# Patient Record
Sex: Female | Born: 1935 | Race: White | Hispanic: No | Smoking: Never smoker
Health system: Southern US, Community
[De-identification: ages and names within clinical notes are randomized; demographics above are authoritative.]

## PROBLEM LIST (undated history)

## (undated) DIAGNOSIS — G5600 Carpal tunnel syndrome, unspecified upper limb: Secondary | ICD-10-CM

## (undated) DIAGNOSIS — S04032A Injury of optic tract and pathways, left eye, initial encounter: Secondary | ICD-10-CM

## (undated) DIAGNOSIS — N200 Calculus of kidney: Secondary | ICD-10-CM

## (undated) DIAGNOSIS — M810 Age-related osteoporosis without current pathological fracture: Secondary | ICD-10-CM

## (undated) DIAGNOSIS — I10 Essential (primary) hypertension: Secondary | ICD-10-CM

## (undated) DIAGNOSIS — I2119 ST elevation (STEMI) myocardial infarction involving other coronary artery of inferior wall: Principal | ICD-10-CM

## (undated) DIAGNOSIS — Z905 Acquired absence of kidney: Secondary | ICD-10-CM

## (undated) DIAGNOSIS — H8110 Benign paroxysmal vertigo, unspecified ear: Secondary | ICD-10-CM

## (undated) DIAGNOSIS — I2510 Atherosclerotic heart disease of native coronary artery without angina pectoris: Secondary | ICD-10-CM

## (undated) DIAGNOSIS — E119 Type 2 diabetes mellitus without complications: Secondary | ICD-10-CM

## (undated) DIAGNOSIS — M722 Plantar fascial fibromatosis: Secondary | ICD-10-CM

## (undated) DIAGNOSIS — R269 Unspecified abnormalities of gait and mobility: Secondary | ICD-10-CM

## (undated) DIAGNOSIS — E042 Nontoxic multinodular goiter: Secondary | ICD-10-CM

## (undated) DIAGNOSIS — N393 Stress incontinence (female) (male): Secondary | ICD-10-CM

## (undated) DIAGNOSIS — J309 Allergic rhinitis, unspecified: Secondary | ICD-10-CM

## (undated) DIAGNOSIS — M316 Other giant cell arteritis: Secondary | ICD-10-CM

## (undated) HISTORY — DX: Stress incontinence (female) (male): N39.3

## (undated) HISTORY — DX: Age-related osteoporosis without current pathological fracture: M81.0

## (undated) HISTORY — PX: ABDOMINAL HYSTERECTOMY: SHX81

## (undated) HISTORY — DX: Carpal tunnel syndrome, unspecified upper limb: G56.00

## (undated) HISTORY — DX: Unspecified abnormalities of gait and mobility: R26.9

## (undated) HISTORY — DX: Calculus of kidney: N20.0

## (undated) HISTORY — DX: Atherosclerotic heart disease of native coronary artery without angina pectoris: I25.10

## (undated) HISTORY — DX: Type 2 diabetes mellitus without complications: E11.9

## (undated) HISTORY — DX: Other giant cell arteritis: M31.6

## (undated) HISTORY — PX: CATARACT EXTRACTION, BILATERAL: SHX1313

## (undated) HISTORY — DX: Allergic rhinitis, unspecified: J30.9

## (undated) HISTORY — DX: Injury of optic tract and pathways, left side, initial encounter: S04.032A

## (undated) HISTORY — DX: Benign paroxysmal vertigo, unspecified ear: H81.10

## (undated) HISTORY — DX: Acquired absence of kidney: Z90.5

## (undated) HISTORY — DX: ST elevation (STEMI) myocardial infarction involving other coronary artery of inferior wall: I21.19

## (undated) HISTORY — DX: Essential (primary) hypertension: I10

## (undated) HISTORY — PX: CORONARY ANGIOPLASTY: SHX604

## (undated) HISTORY — DX: Nontoxic multinodular goiter: E04.2

## (undated) HISTORY — DX: Plantar fascial fibromatosis: M72.2

---

## 2000-06-09 ENCOUNTER — Encounter: Payer: Self-pay | Admitting: Family Medicine

## 2000-06-09 ENCOUNTER — Encounter: Admission: RE | Admit: 2000-06-09 | Discharge: 2000-06-09 | Payer: Self-pay | Admitting: Family Medicine

## 2000-06-30 ENCOUNTER — Encounter: Payer: Self-pay | Admitting: Family Medicine

## 2000-06-30 ENCOUNTER — Encounter: Admission: RE | Admit: 2000-06-30 | Discharge: 2000-06-30 | Payer: Self-pay | Admitting: Family Medicine

## 2001-04-09 ENCOUNTER — Encounter: Payer: Self-pay | Admitting: Emergency Medicine

## 2001-04-09 ENCOUNTER — Emergency Department (HOSPITAL_COMMUNITY): Admission: EM | Admit: 2001-04-09 | Discharge: 2001-04-09 | Payer: Self-pay | Admitting: Emergency Medicine

## 2001-05-08 ENCOUNTER — Encounter: Payer: Self-pay | Admitting: Family Medicine

## 2001-05-08 ENCOUNTER — Encounter: Admission: RE | Admit: 2001-05-08 | Discharge: 2001-05-08 | Payer: Self-pay | Admitting: Family Medicine

## 2001-06-11 ENCOUNTER — Encounter: Admission: RE | Admit: 2001-06-11 | Discharge: 2001-06-11 | Payer: Self-pay | Admitting: Family Medicine

## 2001-06-11 ENCOUNTER — Encounter: Payer: Self-pay | Admitting: Family Medicine

## 2002-06-18 ENCOUNTER — Encounter: Payer: Self-pay | Admitting: Family Medicine

## 2002-06-18 ENCOUNTER — Encounter: Admission: RE | Admit: 2002-06-18 | Discharge: 2002-06-18 | Payer: Self-pay | Admitting: Family Medicine

## 2002-10-30 ENCOUNTER — Encounter: Payer: Self-pay | Admitting: Family Medicine

## 2002-10-30 ENCOUNTER — Encounter: Admission: RE | Admit: 2002-10-30 | Discharge: 2002-10-30 | Payer: Self-pay | Admitting: Family Medicine

## 2003-03-17 ENCOUNTER — Ambulatory Visit (HOSPITAL_COMMUNITY): Admission: RE | Admit: 2003-03-17 | Discharge: 2003-03-17 | Payer: Self-pay | Admitting: Gastroenterology

## 2003-04-07 ENCOUNTER — Encounter: Payer: Self-pay | Admitting: Emergency Medicine

## 2003-04-07 ENCOUNTER — Emergency Department (HOSPITAL_COMMUNITY): Admission: EM | Admit: 2003-04-07 | Discharge: 2003-04-07 | Payer: Self-pay | Admitting: Emergency Medicine

## 2003-04-14 ENCOUNTER — Emergency Department (HOSPITAL_COMMUNITY): Admission: EM | Admit: 2003-04-14 | Discharge: 2003-04-14 | Payer: Self-pay | Admitting: Emergency Medicine

## 2003-12-11 ENCOUNTER — Ambulatory Visit (HOSPITAL_COMMUNITY): Admission: RE | Admit: 2003-12-11 | Discharge: 2003-12-11 | Payer: Self-pay | Admitting: Family Medicine

## 2005-10-10 ENCOUNTER — Ambulatory Visit: Payer: Self-pay | Admitting: Pulmonary Disease

## 2005-10-18 ENCOUNTER — Ambulatory Visit: Payer: Self-pay | Admitting: Pulmonary Disease

## 2005-10-20 ENCOUNTER — Ambulatory Visit: Payer: Self-pay | Admitting: Cardiology

## 2005-11-03 ENCOUNTER — Ambulatory Visit: Payer: Self-pay | Admitting: Pulmonary Disease

## 2005-11-04 ENCOUNTER — Ambulatory Visit: Payer: Self-pay | Admitting: Pulmonary Disease

## 2006-04-14 ENCOUNTER — Ambulatory Visit: Payer: Self-pay | Admitting: Emergency Medicine

## 2006-08-19 ENCOUNTER — Inpatient Hospital Stay (HOSPITAL_COMMUNITY): Admission: EM | Admit: 2006-08-19 | Discharge: 2006-08-24 | Payer: Self-pay | Admitting: Emergency Medicine

## 2006-08-21 ENCOUNTER — Ambulatory Visit: Payer: Self-pay | Admitting: Physical Medicine & Rehabilitation

## 2006-08-22 ENCOUNTER — Encounter: Payer: Self-pay | Admitting: Vascular Surgery

## 2006-08-25 ENCOUNTER — Ambulatory Visit: Payer: Self-pay | Admitting: Family Medicine

## 2006-08-30 ENCOUNTER — Ambulatory Visit: Payer: Self-pay | Admitting: Internal Medicine

## 2006-09-04 ENCOUNTER — Ambulatory Visit: Payer: Self-pay | Admitting: Internal Medicine

## 2006-09-06 ENCOUNTER — Ambulatory Visit: Payer: Self-pay | Admitting: Internal Medicine

## 2006-09-08 ENCOUNTER — Ambulatory Visit: Payer: Self-pay | Admitting: Internal Medicine

## 2006-10-12 DIAGNOSIS — N393 Stress incontinence (female) (male): Secondary | ICD-10-CM

## 2006-10-12 DIAGNOSIS — M316 Other giant cell arteritis: Secondary | ICD-10-CM

## 2006-10-12 DIAGNOSIS — J309 Allergic rhinitis, unspecified: Secondary | ICD-10-CM

## 2006-10-12 DIAGNOSIS — M810 Age-related osteoporosis without current pathological fracture: Secondary | ICD-10-CM | POA: Insufficient documentation

## 2006-10-12 HISTORY — DX: Age-related osteoporosis without current pathological fracture: M81.0

## 2006-10-12 HISTORY — DX: Allergic rhinitis, unspecified: J30.9

## 2006-10-12 HISTORY — DX: Other giant cell arteritis: M31.6

## 2006-10-12 HISTORY — DX: Stress incontinence (female) (male): N39.3

## 2007-01-22 ENCOUNTER — Encounter: Payer: Self-pay | Admitting: Family Medicine

## 2007-01-22 DIAGNOSIS — R269 Unspecified abnormalities of gait and mobility: Secondary | ICD-10-CM

## 2007-01-22 DIAGNOSIS — S329XXA Fracture of unspecified parts of lumbosacral spine and pelvis, initial encounter for closed fracture: Secondary | ICD-10-CM | POA: Insufficient documentation

## 2007-01-22 HISTORY — DX: Unspecified abnormalities of gait and mobility: R26.9

## 2008-09-10 ENCOUNTER — Encounter: Payer: Self-pay | Admitting: Family Medicine

## 2008-11-18 ENCOUNTER — Ambulatory Visit: Payer: Self-pay | Admitting: Family Medicine

## 2008-11-18 LAB — CONVERTED CEMR LAB: Sed Rate: 27 mm/hr — ABNORMAL HIGH (ref 0–22)

## 2008-12-18 ENCOUNTER — Ambulatory Visit: Payer: Self-pay | Admitting: Family Medicine

## 2009-01-27 ENCOUNTER — Encounter: Payer: Self-pay | Admitting: Family Medicine

## 2009-03-16 ENCOUNTER — Ambulatory Visit: Payer: Self-pay | Admitting: Family Medicine

## 2009-03-16 DIAGNOSIS — R5381 Other malaise: Secondary | ICD-10-CM

## 2009-03-16 DIAGNOSIS — R5383 Other fatigue: Secondary | ICD-10-CM

## 2009-03-17 LAB — CONVERTED CEMR LAB
Hgb A1c MFr Bld: 6.5 % (ref 4.6–6.5)
Sed Rate: 13 mm/hr (ref 0–22)

## 2009-08-04 ENCOUNTER — Ambulatory Visit: Payer: Self-pay | Admitting: Family Medicine

## 2009-08-04 DIAGNOSIS — G5600 Carpal tunnel syndrome, unspecified upper limb: Secondary | ICD-10-CM | POA: Insufficient documentation

## 2009-08-04 HISTORY — DX: Carpal tunnel syndrome, unspecified upper limb: G56.00

## 2009-08-26 ENCOUNTER — Telehealth: Payer: Self-pay | Admitting: Family Medicine

## 2009-09-14 ENCOUNTER — Ambulatory Visit: Payer: Self-pay | Admitting: Family Medicine

## 2009-09-16 LAB — CONVERTED CEMR LAB: Microalb, Ur: 2.2 mg/dL — ABNORMAL HIGH (ref 0.0–1.9)

## 2009-12-08 ENCOUNTER — Ambulatory Visit: Payer: Self-pay | Admitting: Family Medicine

## 2009-12-08 LAB — CONVERTED CEMR LAB
Hgb A1c MFr Bld: 6.5 % (ref 4.6–6.5)
Sed Rate: 10 mm/hr (ref 0–22)

## 2010-02-09 ENCOUNTER — Ambulatory Visit: Payer: Self-pay | Admitting: Family Medicine

## 2010-03-12 ENCOUNTER — Ambulatory Visit: Payer: Self-pay | Admitting: Family Medicine

## 2010-03-12 DIAGNOSIS — R030 Elevated blood-pressure reading, without diagnosis of hypertension: Secondary | ICD-10-CM

## 2010-03-19 ENCOUNTER — Telehealth: Payer: Self-pay | Admitting: Family Medicine

## 2010-03-26 ENCOUNTER — Ambulatory Visit: Payer: Self-pay | Admitting: Family Medicine

## 2010-03-26 DIAGNOSIS — M722 Plantar fascial fibromatosis: Secondary | ICD-10-CM

## 2010-03-26 HISTORY — DX: Plantar fascial fibromatosis: M72.2

## 2010-03-30 LAB — CONVERTED CEMR LAB: Sed Rate: 66 mm/hr — ABNORMAL HIGH (ref 0–22)

## 2010-04-02 ENCOUNTER — Ambulatory Visit: Payer: Self-pay | Admitting: Family Medicine

## 2010-05-04 ENCOUNTER — Ambulatory Visit: Payer: Self-pay | Admitting: Family Medicine

## 2010-05-06 ENCOUNTER — Telehealth: Payer: Self-pay | Admitting: Family Medicine

## 2010-06-11 ENCOUNTER — Ambulatory Visit: Payer: Self-pay | Admitting: Family Medicine

## 2010-06-14 LAB — CONVERTED CEMR LAB
Hgb A1c MFr Bld: 7.3 % — ABNORMAL HIGH (ref 4.6–6.5)
Sed Rate: 4 mm/hr (ref 0–22)

## 2010-06-15 ENCOUNTER — Telehealth: Payer: Self-pay | Admitting: Family Medicine

## 2010-06-21 ENCOUNTER — Telehealth: Payer: Self-pay | Admitting: Family Medicine

## 2010-06-24 ENCOUNTER — Ambulatory Visit: Payer: Self-pay | Admitting: Family Medicine

## 2010-06-28 ENCOUNTER — Telehealth: Payer: Self-pay | Admitting: Family Medicine

## 2010-08-12 ENCOUNTER — Telehealth: Payer: Self-pay | Admitting: Family Medicine

## 2010-08-19 ENCOUNTER — Other Ambulatory Visit: Payer: Self-pay | Admitting: Family Medicine

## 2010-08-19 ENCOUNTER — Ambulatory Visit
Admission: RE | Admit: 2010-08-19 | Discharge: 2010-08-19 | Payer: Self-pay | Source: Home / Self Care | Attending: Family Medicine | Admitting: Family Medicine

## 2010-08-19 LAB — HEMOGLOBIN A1C: Hgb A1c MFr Bld: 7.5 % — ABNORMAL HIGH (ref 4.6–6.5)

## 2010-08-19 LAB — SEDIMENTATION RATE: Sed Rate: 15 mm/hr (ref 0–22)

## 2010-09-05 ENCOUNTER — Encounter: Payer: Self-pay | Admitting: Family Medicine

## 2010-09-14 ENCOUNTER — Telehealth: Payer: Self-pay | Admitting: Family Medicine

## 2010-09-14 NOTE — Assessment & Plan Note (Signed)
Summary: 3 month rov/njr   Vital Signs:  Patient profile:   77 year old female Menstrual status:  postmenopausal Height:      63.5 inches Weight:      142 pounds Temp:     98.2 degrees F oral Pulse rate:   72 / minute Resp:     14 per minute BP sitting:   120 / 70  (left arm)  Vitals Entered By: Willy Eddy, LPN (December 08, 2009 9:57 AM) CC: roa-has been out of fosamax for 2 weeks-c/o intermittent diarrhea for about 2 months   History of Present Illness: Patient seen for follow up items below.  History temporal arteritis. Has been on low-dose prednisone and methotrexate but recent sedimentation rates normal and pt clinically stable for several months now. No recent headaches or body aches. She has some generalized arthritis pains. No longer seeing rheumatologist. She sees ophthalmologist once per year. No recent change of vision.  History of osteoporosis. Currently taking calcium and vitamin D. No recent fractures. Needs refills Fosamax.  Type 2 diabetes with onset of prednisone therapy. Currently very low dose glimepiride 1 mg daily. Blood sugar stable.  Diabetes Management History:      She has not been enrolled in the "Diabetic Education Program".  She states understanding of dietary principles and is following her diet appropriately.  No sensory loss is reported.  Self foot exams are being performed.  She is checking home blood sugars.  She says that she is not exercising regularly.        Hypoglycemic symptoms are not occurring.  No hyperglycemic symptoms are reported.        There are no symptoms to suggest diabetic complications.     Preventive Screening-Counseling & Management  Alcohol-Tobacco     Smoking Status: never  Current Problems (verified): 1)  Carpal Tunnel Syndrome  (ICD-354.0) 2)  Fatigue  (ICD-780.79) 3)  Aodm  (ICD-250.00) 4)  Unsteady Gait  (ICD-781.2) 5)  Pelvic Fracture  (ICD-808.8) 6)  Temporal Arteritis  (ICD-446.5) 7)  Rhinitis, Allergic   (ICD-477.9) 8)  Osteoporosis, Unspecified  (ICD-733.00) 9)  Incontinence, Stress, Female  (ICD-625.6) 10)  Hypertension, Benign Systemic  (ICD-401.1)  Current Medications (verified): 1)  Prednisone 5 Mg Tabs (Prednisone) .... Once Daily 2)  Glimepiride 1 Mg Tabs (Glimepiride) .... Once Daily 3)  Methotrexate 2.5 Mg Tabs (Methotrexate Sodium) .... 2 Tabs By Mouth Once Weekly 4)  Fosamax 70 Mg Tabs (Alendronate Sodium) .... One Tab By Mouth Weekly 5)  Calcium + D 250-125 Mg-Unit Tabs (Calcium-Vitamin D) .... Once Daily  Allergies (verified): 1)  ! Penicillin V Potassium (Penicillin V Potassium) 2)  ! Sulfadiazine (Sulfadiazine)  Past History:  Past Medical History: Last updated: 11/18/2008 decreased hearing, hyperglycemia (2nd to chronic steroids), immunocomprimised ( 2nd to methotrexate_, intrathoracic LAD, multinodular thyroid, solitary L kidney (atrophic vs congenital), visual loss (2 to temporal arteritis) Kidney stones Temporal arteritis  Social History: Last updated: 11/18/2008 Retired Widow/Widower Never Smoked Alcohol use-no  Review of Systems  The patient denies anorexia, fever, weight loss, chest pain, syncope, dyspnea on exertion, peripheral edema, prolonged cough, headaches, hemoptysis, abdominal pain, melena, hematochezia, severe indigestion/heartburn, hematuria, and muscle weakness.    Physical Exam  General:  Well-developed,well-nourished,in no acute distress; alert,appropriate and cooperative throughout examination Head:  no temporal artery tenderness Lungs:  Normal respiratory effort, chest expands symmetrically. Lungs are clear to auscultation, no crackles or wheezes. Heart:  Normal rate and regular rhythm. S1 and S2 normal without gallop,  murmur, click, rub or other extra sounds. Neurologic:  alert & oriented X3, cranial nerves II-XII intact, and strength normal in all extremities.   Cervical Nodes:  No lymphadenopathy noted Psych:  normally interactive,  good eye contact, not anxious appearing, and not depressed appearing.    Diabetes Management Exam:    Foot Exam (with socks and/or shoes not present):       Sensory-Pinprick/Light touch:          Left medial foot (L-4): normal          Left dorsal foot (L-5): normal          Left lateral foot (S-1): normal          Right medial foot (L-4): normal          Right dorsal foot (L-5): normal          Right lateral foot (S-1): normal       Sensory-Monofilament:          Left foot: normal          Right foot: normal       Inspection:          Left foot: normal          Right foot: normal       Nails:          Left foot: normal          Right foot: normal    Eye Exam:       Eye Exam done elsewhere          Date: 02/12/2009          Results: normal          Done by: Dr Elmer Picker   Impression & Recommendations:  Problem # 1:  AODM (ICD-250.00) consider discontinue and follow after off prednisone. Her updated medication list for this problem includes:    Glimepiride 1 Mg Tabs (Glimepiride) ..... Once daily  Orders: Venipuncture (81191) TLB-A1C / Hgb A1C (Glycohemoglobin) (83036-A1C)  Problem # 2:  TEMPORAL ARTERITIS (ICD-446.5) Clinically stable.  Repeat sed rate and if stable will taper off prednisone and methotrexate. Orders: Venipuncture (47829) TLB-Sedimentation Rate (ESR) (85652-ESR)  Problem # 3:  OSTEOPOROSIS, UNSPECIFIED (ICD-733.00) refilled Fosamax. Her updated medication list for this problem includes:    Fosamax 70 Mg Tabs (Alendronate sodium) ..... One tab by mouth weekly    Calcium + D 250-125 Mg-unit Tabs (Calcium-vitamin d) ..... Once daily  Complete Medication List: 1)  Prednisone 5 Mg Tabs (Prednisone) .... Alternate 5mg  and 2.5mg  every other day for 3 weeks and then 2.5 mg for 3 weeks and then discontinue 2)  Glimepiride 1 Mg Tabs (Glimepiride) .... Once daily 3)  Methotrexate 2.5 Mg Tabs (Methotrexate sodium) .... 2 tabs by mouth once weekly 4)  Fosamax 70  Mg Tabs (Alendronate sodium) .... One tab by mouth weekly 5)  Calcium + D 250-125 Mg-unit Tabs (Calcium-vitamin d) .... Once daily  Diabetes Management Assessment/Plan:      Her blood pressure goal is < 130/80.    Patient Instructions: 1)  We will plan to taper back your medications including methotrexate and prednisone if the labs are favorable 2)  Please schedule a follow-up appointment in 3 months .  Prescriptions: FOSAMAX 70 MG TABS (ALENDRONATE SODIUM) one tab by mouth weekly  #12 x 3   Entered and Authorized by:   Evelena Peat MD   Signed by:   Evelena Peat MD on 12/08/2009  Method used:   Electronically to        [facility]* (mail-order)             ,          Ph: [phone]       Fax: [phone]   RxID:   [id number] METHOTREXATE 2.5 MG TABS (METHOTREXATE SODIUM) 2 tabs by mouth once weekly  #36 x 3   Entered and Authorized by:   Evelena Peat MD   Signed by:   Evelena Peat MD on 12/08/2009   Method used:   Electronically to        [facility]* (mail-order)             ,          Ph: [phone]       Fax: [phone]   RxID:   [id number] GLIMEPIRIDE 1 MG TABS (GLIMEPIRIDE) once daily  #90 x 3   Entered and Authorized by:   Evelena Peat MD   Signed by:   Evelena Peat MD on 12/08/2009   Method used:   Electronically to        [facility]* (mail-order)             ,          Ph: [phone]       Fax: [phone]   RxID:   [id number]

## 2010-09-14 NOTE — Assessment & Plan Note (Signed)
Summary: 1 moonth f/u//alp   Vital Signs:  Patient profile:   77 year old female Menstrual status:  postmenopausal Weight:      147 pounds Temp:     98.0 degrees F oral BP sitting:   142 / 70  (left arm) Cuff size:   regular  Vitals Entered By: Sid Falcon LPN (March 12, 2010 10:49 AM)  Serial Vital Signs/Assessments:  Time      Position  BP       Pulse  Resp  Temp     By                     140/64                         Evelena Peat MD   History of Present Illness: patient for followup elevated blood pressure. She's not any headaches or dizziness. Never treated for hypertension.  She has type 2 diabetes probably induced by prednisone which she was on for temporal arteritis. She is back to very low-dose prednisone at this time. Recent fasting blood sugar 136. No symptoms of hyperglycemia.  Allergies: 1)  ! Penicillin V Potassium (Penicillin V Potassium) 2)  ! Sulfadiazine (Sulfadiazine)  Past History:  Past Medical History: Last updated: 11/18/2008 decreased hearing, hyperglycemia (2nd to chronic steroids), immunocomprimised ( 2nd to methotrexate_, intrathoracic LAD, multinodular thyroid, solitary L kidney (atrophic vs congenital), visual loss (2 to temporal arteritis) Kidney stones Temporal arteritis  Review of Systems  The patient denies anorexia, fever, weight loss, chest pain, dyspnea on exertion, and peripheral edema.    Physical Exam  General:  Well-developed,well-nourished,in no acute distress; alert,appropriate and cooperative throughout examination Neck:  No deformities, masses, or tenderness noted. Lungs:  Normal respiratory effort, chest expands symmetrically. Lungs are clear to auscultation, no crackles or wheezes. Heart:  normal rate and regular rhythm.   Extremities:  no edema.   Impression & Recommendations:  Problem # 1:  ELEVATED BLOOD PRESSURE (ICD-796.2) Assessment Improved blood pressure has improved today. Continue to monitor this  time.  Problem # 2:  AODM (ICD-250.00) cont current dose of med.  Repeat A1C and sed rate in 3 months and if stable at that time plan to d/c prednisone and diabetic med then. Her updated medication list for this problem includes:    Glimepiride 1 Mg Tabs (Glimepiride) ..... Once daily  Complete Medication List: 1)  Prednisone 5 Mg Tabs (Prednisone) .... Alternate 5mg  and 2.5mg  every other day for 3 weeks and then 2.5 mg for 3 weeks and then discontinue 2)  Glimepiride 1 Mg Tabs (Glimepiride) .... Once daily 3)  Methotrexate 2.5 Mg Tabs (Methotrexate sodium) .... 2 tabs by mouth once weekly 4)  Fosamax 70 Mg Tabs (Alendronate sodium) .... One tab by mouth weekly 5)  Calcium + D 250-125 Mg-unit Tabs (Calcium-vitamin d) .... Once daily 6)  Vitamin E 400 Unit Caps (Vitamin e) .... Once daily  Other Orders: Prescription Created Electronically [phone])  Patient Instructions: 1)  Please schedule a follow-up appointment in 3 months .  2)  Limit your Sodium(salt) .  3)  Check your  Blood Pressure regularly . If it is above:140/90   you should make an appointment. Prescriptions: FOSAMAX 70 MG TABS (ALENDRONATE SODIUM) one tab by mouth weekly  #12 x 3   Entered and Authorized by:   Evelena Peat MD   Signed by:   Evelena Peat MD on 03/12/2010  Method used:   Electronically to        [facility]  [place] [address] (retail)       [address]       [place], [place]  [address]       Ph: [phone] or [phone]       Fax: [phone]   RxID:   [id number] GLIMEPIRIDE 1 MG TABS (GLIMEPIRIDE) once daily  #90 x 3   Entered and Authorized by:   Evelena Peat MD   Signed by:   Evelena Peat MD on 03/12/2010   Method used:   Electronically to        [facility]  [place] [address] (retail)       [address]       [place], [place]  [address]       Ph: [phone] or [phone]       Fax: [phone]   RxID:   [id number]

## 2010-09-14 NOTE — Assessment & Plan Note (Signed)
Summary: recheck/sedrate/dm   Vital Signs:  Patient profile:   77 year old female Menstrual status:  postmenopausal Weight:      145 pounds Temp:     97.6 degrees F oral BP sitting:   130 / 62  (left arm) Cuff size:   regular  Vitals Entered By: Sid Falcon LPN (April 02, 2010 10:05 AM) CC: 1 month follow-up   History of Present Illness: Recent fatigue and some off and on headaches. Diagnosed with PMR/temporal arteritis several years ago.  We had been tapering back her prednisone.  She remains on methotrexate. Recent Sed rate back up to 66.   No change in vision.    Headaches are dull and poorly localized.  Allergies: 1)  ! Penicillin V Potassium (Penicillin V Potassium) 2)  ! Sulfadiazine (Sulfadiazine)  Past History:  Past Medical History: Last updated: 11/18/2008 decreased hearing, hyperglycemia (2nd to chronic steroids), immunocomprimised ( 2nd to methotrexate_, intrathoracic LAD, multinodular thyroid, solitary L kidney (atrophic vs congenital), visual loss (2 to temporal arteritis) Kidney stones Temporal arteritis  Social History: Last updated: 11/18/2008 Retired Widow/Widower Never Smoked Alcohol use-no PMH reviewed for relevance  Review of Systems  The patient denies anorexia, fever, weight loss, chest pain, syncope, dyspnea on exertion, peripheral edema, and prolonged cough.    Physical Exam  General:  Well-developed,well-nourished,in no acute distress; alert,appropriate and cooperative throughout examination Head:  No temporal tenderness. Ears:  External ear exam shows no significant lesions or deformities.  Otoscopic examination reveals clear canals, tympanic membranes are intact bilaterally without bulging, retraction, inflammation or discharge. Hearing is grossly normal bilaterally. Mouth:  Oral mucosa and oropharynx without lesions or exudates.  Teeth in good repair. Lungs:  Normal respiratory effort, chest expands symmetrically. Lungs are clear  to auscultation, no crackles or wheezes. Heart:  normal rate and regular rhythm.   Extremities:  No clubbing, cyanosis, edema, or deformity noted with normal full range of motion of all joints.   Neurologic:  alert & oriented X3, cranial nerves II-XII intact, strength normal in all extremities, and gait normal.   Skin:  no rashes.     Impression & Recommendations:  Problem # 1:  FATIGUE (ICD-780.79) ?multifactorial.  Problem # 2:  TEMPORAL ARTERITIS (ICD-446.5) ESR repeat.  Pred has been increased back to 10 mg daily for the time being. Orders: Sedimentation Rate, non-automated (30865)  Complete Medication List: 1)  Prednisone 5 Mg Tabs (Prednisone) .... Alternate 5mg  and 2.5mg  every other day for 3 weeks and then 2.5 mg for 3 weeks and then discontinue 2)  Glimepiride 1 Mg Tabs (Glimepiride) .... Once daily 3)  Methotrexate 2.5 Mg Tabs (Methotrexate sodium) .... 2 tabs by mouth once weekly 4)  Fosamax 70 Mg Tabs (Alendronate sodium) .... One tab by mouth weekly 5)  Calcium + D 250-125 Mg-unit Tabs (Calcium-vitamin d) .... Once daily 6)  Vitamin E 400 Unit Caps (Vitamin e) .... Once daily 7)  Fluticasone Propionate 50 Mcg/act Susp (Fluticasone propionate) .... 2 sprays per nostril once daily as needed.  Other Orders: Venipuncture (78469)  Patient Instructions: 1)  Please schedule a follow-up appointment in 1 month.   Laboratory Results   Blood Tests   Date/Time Recieved: April 02, 2010 11:23 AM  Date/Time Reported: April 02, 2010 11:23 AM   SED rate: 20  Comments: Wynona Canes, CMA  April 02, 2010 11:23 AM      Appended Document: recheck/sedrate/dm sed rate back down to 20.  cont prednisone 10 mg daily and  office follow up one month.  Appended Document: recheck/sedrate/dm Pt is calling for lab results. [phone]  Appended Document: recheck/sedrate/dm Pt informed.  She will cont on Prednisone and follow-up in one month

## 2010-09-14 NOTE — Progress Notes (Signed)
Summary: Pt req generic fosomax to [facility] mail order rx  Phone Note Call from Patient Call back at Home Phone [phone]   Caller: Patient Summary of Call: Pt req refill of generic Fosamax to [facility] mail order pharmacy.  Initial call taken by: Lucy Antigua,  August 26, 2009 11:23 AM  Follow-up for Phone Call        Pt informed Rx sent to [facility] Follow-up by: Sid Falcon LPN,  August 26, 2009 1:15 PM    Prescriptions: FOSAMAX 70 MG TABS (ALENDRONATE SODIUM) one tab by mouth weekly  #12 x 3   Entered by:   Sid Falcon LPN   Authorized by:   Evelena Peat MD   Signed by:   Sid Falcon LPN on 09/81/1914   Method used:   Electronically to        [facility]* (mail-order)             ,          Ph: [phone]       Fax: [phone]   RxID:   [id number]

## 2010-09-14 NOTE — Assessment & Plan Note (Signed)
Summary: headache/dm   Vital Signs:  Patient profile:   77 year old female Menstrual status:  postmenopausal Weight:      142 pounds Temp:     98.1 degrees F oral BP sitting:   110 / 70  (left arm) Cuff size:   regular  Vitals Entered By: Duard Brady LPN (March 26, 2010 2:38 PM) CC: c/o headache - 'all over pain' Is Patient Diabetic? No   History of Present Illness: Progressive fatigue, headaches, and achiness. Symptoms for weeks.  Less active.  Hx of PMR and tapering prednisone at 2.5 mg daily. CBGs stable.  No blurred vision.  L heel pain worse first in AM and 2-3 weeks duration.  No injury and no change of shoe wear. No alleviating factors.  allergy symptoms recently.  Responded to Nasonex in past.  Would like to consider nasal steroid. No purulent secretions.  No fever or chills.  Allergies: 1)  ! Penicillin V Potassium (Penicillin V Potassium) 2)  ! Sulfadiazine (Sulfadiazine)  Past History:  Past Medical History: Last updated: 11/18/2008 decreased hearing, hyperglycemia (2nd to chronic steroids), immunocomprimised ( 2nd to methotrexate_, intrathoracic LAD, multinodular thyroid, solitary L kidney (atrophic vs congenital), visual loss (2 to temporal arteritis) Kidney stones Temporal arteritis PMH reviewed for relevance  Review of Systems  The patient denies anorexia, fever, weight loss, chest pain, syncope, dyspnea on exertion, peripheral edema, prolonged cough, hemoptysis, abdominal pain, melena, hematochezia, difficulty walking, and depression.    Physical Exam  General:  Well-developed,well-nourished,in no acute distress; alert,appropriate and cooperative throughout examination Head:  Normocephalic and atraumatic without obvious abnormalities. No apparent alopecia or balding. Eyes:  pupils equal, pupils round, and pupils reactive to light.   Ears:  External ear exam shows no significant lesions or deformities.  Otoscopic examination reveals clear  canals, tympanic membranes are intact bilaterally without bulging, retraction, inflammation or discharge. Hearing is grossly normal bilaterally. Mouth:  Oral mucosa and oropharynx without lesions or exudates.  Teeth in good repair. Neck:  No deformities, masses, or tenderness noted. Lungs:  Normal respiratory effort, chest expands symmetrically. Lungs are clear to auscultation, no crackles or wheezes. Heart:  normal rate and regular rhythm.   Msk:  No deformity or scoliosis noted of thoracic or lumbar spine.   Extremities:  No clubbing, cyanosis, edema, or deformity noted with normal full range of motion of all joints.   Neurologic:  alert & oriented X3 and cranial nerves II-XII intact.     Impression & Recommendations:  Problem # 1:  TEMPORAL ARTERITIS (ICD-446.5) ?flare as steroids tapered, though surprising since she has had dx now for several years.  If sed rate increasing, consider bump in steroids. Orders: Specimen Handling (09811) Venipuncture (91478) TLB-Sedimentation Rate (ESR) (85652-ESR)  Problem # 2:  FATIGUE (ICD-780.79) ?sec to #1.  Problem # 3:  FASCIITIS, PLANTAR (ICD-728.71) Assessment: New discussed rx options.  pt not interested in steroid injection at this time.  Problem # 4:  RHINITIS, ALLERGIC (ICD-477.9)  Flonase steroid. Her updated medication list for this problem includes:    Fluticasone Propionate 50 Mcg/act Susp (Fluticasone propionate) .Marland Kitchen... 2 sprays per nostril once daily as needed.  Orders: Prescription Created Electronically [phone])  Complete Medication List: 1)  Prednisone 5 Mg Tabs (Prednisone) .... Alternate 5mg  and 2.5mg  every other day for 3 weeks and then 2.5 mg for 3 weeks and then discontinue 2)  Glimepiride 1 Mg Tabs (Glimepiride) .... Once daily 3)  Methotrexate 2.5 Mg Tabs (Methotrexate sodium) .... 2 tabs  by mouth once weekly 4)  Fosamax 70 Mg Tabs (Alendronate sodium) .... One tab by mouth weekly 5)  Calcium + D 250-125 Mg-unit Tabs  (Calcium-vitamin d) .... Once daily 6)  Vitamin E 400 Unit Caps (Vitamin e) .... Once daily 7)  Fluticasone Propionate 50 Mcg/act Susp (Fluticasone propionate) .... 2 sprays per nostril once daily as needed. Prescriptions: FLUTICASONE PROPIONATE 50 MCG/ACT SUSP (FLUTICASONE PROPIONATE) 2 sprays per nostril once daily as needed.  #1 x 5   Entered and Authorized by:   Evelena Peat MD   Signed by:   Evelena Peat MD on 03/26/2010   Method used:   [facility] to        [facility]  [place] [address] (retail)       [address]       [place], [place]  [address]       Ph: [phone] or [phone]       Fax: [phone]   RxID:   [id number]

## 2010-09-14 NOTE — Assessment & Plan Note (Signed)
Summary: CPX (PT WILL COME IN FASTING) // RS   Vital Signs:  Patient profile:   77 year old female Menstrual status:  postmenopausal Height:      63.5 inches Weight:      146 pounds Temp:     97.5 degrees F oral Pulse rate:   70 / minute Pulse rhythm:   regular Resp:     12 per minute BP sitting:   148 / 80  (left arm) Cuff size:   regular  Vitals Entered By: Sid Falcon LPN (September 14, 2009 10:40 AM) CC: CPX, pt fasting, med refills   History of Present Illness: Patient seen for multiple problems followup.  History polymyalgia rheumatica and temporal arteritis. Close follow up with ophthalmologist. been on prednisone and methotrexate for several years.  sedimentation rate been stable. Currently prednisone 5 mg daily and methotrexate 2.5 mg 3 tablets once weekly. No recent muscle aches and no visual disturbance.  Prednisone-induced diabetes. Low-dose glimepiride 1 mg daily. No hypoglycemia. No symptoms of hyperglycemia. A1cs have been stable.    Osteoporosis treated with Fosamax. Takes vitamin D and calcium supplement.  Diabetes Management History:      She has not been enrolled in the "Diabetic Education Program".  She states understanding of dietary principles and is following her diet appropriately.  No sensory loss is reported.  Self foot exams are being performed.  She is checking home blood sugars.  She says that she is not exercising regularly.        Hypoglycemic symptoms are not occurring.  No hyperglycemic symptoms are reported.        There are no symptoms to suggest diabetic complications.  No changes have been made to her treatment plan since last visit.    Preventive Screening-Counseling & Management  Alcohol-Tobacco     Smoking Status: never  Caffeine-Diet-Exercise     Does Patient Exercise: no  Allergies: 1)  ! Penicillin V Potassium (Penicillin V Potassium) 2)  ! Sulfadiazine (Sulfadiazine)  Past History:  Past Medical History: Last updated:  11/18/2008 decreased hearing, hyperglycemia (2nd to chronic steroids), immunocomprimised ( 2nd to methotrexate_, intrathoracic LAD, multinodular thyroid, solitary L kidney (atrophic vs congenital), visual loss (2 to temporal arteritis) Kidney stones Temporal arteritis  Family History: Last updated: 11/18/2008 Family History of Arthritis Family History of Cardiovascular disorder Stroke  Social History: Last updated: 11/18/2008 Retired Widow/Widower Never Smoked Alcohol use-no  Social History: Does Patient Exercise:  no  Review of Systems  The patient denies anorexia, fever, weight loss, weight gain, vision loss, hoarseness, chest pain, syncope, dyspnea on exertion, prolonged cough, headaches, hemoptysis, abdominal pain, melena, hematochezia, severe indigestion/heartburn, hematuria, incontinence, muscle weakness, transient blindness, and depression.         chronic decreased hearing unchanged. Has bilateral hearing aids.mild peripheral edema unchanged  Physical Exam  General:  Well-developed,well-nourished,in no acute distress; alert,appropriate and cooperative throughout examination Head:  Normocephalic and atraumatic without obvious abnormalities. No apparent alopecia or balding. Eyes:  pupils equal, pupils round, and pupils reactive to light.   Ears:  minimal cerumen left canal right clear Nose:  External nasal examination shows no deformity or inflammation. Nasal mucosa are pink and moist without lesions or exudates. Mouth:  Oral mucosa and oropharynx without lesions or exudates.  Teeth in good repair. Neck:  No deformities, masses, or tenderness noted. Lungs:  Normal respiratory effort, chest expands symmetrically. Lungs are clear to auscultation, no crackles or wheezes. Heart:  Normal rate and regular rhythm. S1 and S2  normal without gallop, murmur, click, rub or other extra sounds. Abdomen:  Bowel sounds positive,abdomen soft and non-tender without masses, organomegaly or  hernias noted. Extremities:  trace edema ankles bilaterally. Good distal pulses throughout Neurologic:  alert & oriented X3, cranial nerves II-XII intact, and strength normal in all extremities.    Diabetes Management Exam:    Foot Exam (with socks and/or shoes not present):       Sensory-Pinprick/Light touch:          Left medial foot (L-4): normal          Left dorsal foot (L-5): normal          Left lateral foot (S-1): normal          Right medial foot (L-4): normal          Right dorsal foot (L-5): normal          Right lateral foot (S-1): normal       Sensory-Monofilament:          Left foot: normal          Right foot: normal       Inspection:          Left foot: normal          Right foot: normal       Nails:          Left foot: normal          Right foot: normal    Eye Exam:       Eye Exam done elsewhere          Date: 10/13/2008          Results: normal          Done by: Dr Elmer Picker   Impression & Recommendations:  Problem # 1:  AODM (ICD-250.00)  Her updated medication list for this problem includes:    Glimepiride 1 Mg Tabs (Glimepiride) ..... Once daily  Orders: TLB-A1C / Hgb A1C (Glycohemoglobin) (83036-A1C) TLB-Microalbumin/Creat Ratio, Urine (82043-MALB)  Problem # 2:  TEMPORAL ARTERITIS (ICD-446.5) recheck ESR.  Suspect we will be able to taper methotrexate and prednisone further this year. Orders: TLB-Sedimentation Rate (ESR) (85652-ESR)  Problem # 3:  OSTEOPOROSIS, UNSPECIFIED (ICD-733.00)  Her updated medication list for this problem includes:    Fosamax 70 Mg Tabs (Alendronate sodium) ..... One tab by mouth weekly    Calcium + D 250-125 Mg-unit Tabs (Calcium-vitamin d) ..... Once daily  Orders: T-Vitamin D (25-Hydroxy) [phone])  Complete Medication List: 1)  Prednisone 5 Mg Tabs (Prednisone) .... Once daily 2)  Glimepiride 1 Mg Tabs (Glimepiride) .... Once daily 3)  Methotrexate 2.5 Mg Tabs (Methotrexate sodium) .... 3 tabs by mouth once  weekly 4)  Fosamax 70 Mg Tabs (Alendronate sodium) .... One tab by mouth weekly 5)  Calcium + D 250-125 Mg-unit Tabs (Calcium-vitamin d) .... Once daily  Other Orders: Flu Vaccine 107yrs + (09811) Administration Flu vaccine - MCR (B1478)  Patient Instructions: 1)  Contact Dr Kenna Gilbert office regarding repeat colonoscopy. 2)  It is important that your diabetic A1c level is checked every 3 months.  3)  See your eye doctor yearly to check for diabetic eye damage. 4)  Check your feet each night  for sore areas, calluses or signs of infection.  5)  Please schedule a follow-up appointment in 3 months .  Prescriptions: METHOTREXATE 2.5 MG TABS (METHOTREXATE SODIUM) 3 tabs by mouth once weekly  #36 x 3   Entered and  Authorized by:   Evelena Peat MD   Signed by:   Evelena Peat MD on 09/14/2009   Method used:   Electronically to        [facility]* (mail-order)             ,          Ph: [phone]       Fax: [phone]   RxID:   [id number]    Flu Vaccine Consent Questions     Do you have a history of severe allergic reactions to this vaccine? no    Any prior history of allergic reactions to egg and/or gelatin? no    Do you have a sensitivity to the preservative Thimersol? no    Do you have a past history of Guillan-Barre Syndrome? no    Do you currently have an acute febrile illness? no    Have you ever had a severe reaction to latex? no    Vaccine information given and explained to patient? yes    Are you currently pregnant? no    Lot Number:[id number]   Exp Date:02/11/2010   Site Given  Left Deltoid IMdflu

## 2010-09-14 NOTE — Progress Notes (Signed)
Summary: Pt says scripts should have been sent to [facility] not [facility]  Phone Note Call from Patient Call back at Home Phone [phone]   Caller: Patient Summary of Call: Pt called and said that all her scripts were suppose to be sent to [facility]. Pls have these script transferred to [facility]. [facility].  Pt has been out of medicine for week.  Initial call taken by: Lucy Antigua,  March 19, 2010 11:23 AM  Follow-up for Phone Call        Rx sent, pt informed Follow-up by: Sid Falcon LPN,  March 19, 2010 11:44 AM    Prescriptions: FOSAMAX 70 MG TABS (ALENDRONATE SODIUM) one tab by mouth weekly  #12 x 3   Entered by:   Sid Falcon LPN   Authorized by:   Evelena Peat MD   Signed by:   Sid Falcon LPN on 30/86/5784   Method used:   Electronically to        [facility]* (retail)             ,          Ph: [phone]       Fax: [phone]   RxID:   [id number] GLIMEPIRIDE 1 MG TABS (GLIMEPIRIDE) once daily  #90 x 3   Entered by:   Sid Falcon LPN   Authorized by:   Evelena Peat MD   Signed by:   Sid Falcon LPN on 38/75/6433   Method used:   Electronically to        [facility]* (retail)             ,          Ph: [phone]       Fax: [phone]   RxID:   [id number]

## 2010-09-14 NOTE — Progress Notes (Signed)
Summary: REFILL REQUEST  Phone Note Refill Request Message from:  Patient  [phone] on June 21, 2010 4:50 PM  Refills Requested: Medication #1:  GLIMEPIRIDE 1 MG TABS once daily   Notes: [facility].    Initial call taken by: Debbra Riding,  June 21, 2010 4:51 PM    Prescriptions: GLIMEPIRIDE 1 MG TABS (GLIMEPIRIDE) once daily  #90 x 3   Entered by:   Sid Falcon LPN   Authorized by:   Evelena Peat MD   Signed by:   Sid Falcon LPN on 82/95/6213   Method used:   Electronically to        [facility]* (retail)             ,          Ph: [phone]       Fax: [phone]   RxID:   [id number]

## 2010-09-14 NOTE — Progress Notes (Signed)
Summary: REFILL REQUEST, Prednisone X 3 months  Phone Note Refill Request Message from:  Patient   [phone] on June 15, 2010 12:05 PM  Refills Requested: Medication #1:  PREDNISONE 5 MG TABS 1 and 1/2 tab daily   [facility] [place].    Initial call taken by: Debbra Riding,  June 15, 2010 12:05 PM  Follow-up for Phone Call        refill for 3 months Follow-up by: Evelena Peat MD,  June 15, 2010 1:16 PM    Prescriptions: PREDNISONE 5 MG TABS (PREDNISONE) 1 and 1/2 tab daily, total 7.5mg  daily until 2 month return OV  #45 x 3   Entered by:   Sid Falcon LPN   Authorized by:   Evelena Peat MD   Signed by:   Sid Falcon LPN on 14/78/2956   Method used:   [facility]  [place] [address]       [address]       [place], [place]  [address]       Ph: [phone] or [phone]       Fax: [phone]   RxID:   [id number]

## 2010-09-14 NOTE — Progress Notes (Signed)
Summary: Sed rate results  Phone Note Call from Patient Call back at Home Phone [phone]   Caller: Patient Call For: Evelena Peat MD Summary of Call: Pt is asking for lab result from last office visit. Initial call taken by: Lynann Beaver CMA,  May 06, 2010 3:52 PM  Follow-up for Phone Call        Sed rate was 8.  I would like her to try tapering her prednisone to alternating 7.5 mg with 10 mg until f/u (currently on 10 mg daily).  Additional Follow-up for Phone Call Additional follow up Details #1::        Per Dr. Caryl Never Pt notified. Additional Follow-up by: Lynann Beaver CMA,  May 07, 2010 8:39 AM    New/Updated Medications: PREDNISONE 10 MG TABS (PREDNISONE) Alternate 7.5mg /10/7.5/10 until next appt.

## 2010-09-14 NOTE — Assessment & Plan Note (Signed)
Summary: [name] after titrating off prednisone/bmw   Vital Signs:  Patient profile:   77 year old female Menstrual status:  postmenopausal Weight:      148 pounds Temp:     98 degrees F oral BP sitting:   160 / 64  (left arm) Cuff size:   regular  Vitals Entered By: Sid Falcon LPN (February 09, 2010 11:10 AM)  Serial Vital Signs/Assessments:  Time      Position  BP       Pulse  Resp  Temp     By                     150/60                         Evelena Peat MD    History of Present Illness: Follow up PMR/temporal arteritis.  Pt has been on prednisone and MTX for over 3 years with stale symptoms. Down to 2.5 prednisone now with vision stable and no increase in muscle aches or joint aches. CBGs stable. Last ESR 10. No headaches or new visual diffiuculties.  Allergies: 1)  ! Penicillin V Potassium (Penicillin V Potassium) 2)  ! Sulfadiazine (Sulfadiazine)  Past History:  Past Medical History: Last updated: 11/18/2008 decreased hearing, hyperglycemia (2nd to chronic steroids), immunocomprimised ( 2nd to methotrexate_, intrathoracic LAD, multinodular thyroid, solitary L kidney (atrophic vs congenital), visual loss (2 to temporal arteritis) Kidney stones Temporal arteritis  Social History: Last updated: 11/18/2008 Retired Widow/Widower Never Smoked Alcohol use-no  Review of Systems  The patient denies anorexia, fever, weight loss, and headaches.    Physical Exam  General:  Well-developed,well-nourished,in no acute distress; alert,appropriate and cooperative throughout examination Head:  no temporal artery tenderness. Eyes:  pupils equal, pupils round, and pupils reactive to light.   Mouth:  Oral mucosa and oropharynx without lesions or exudates.  Teeth in good repair. Neck:  No deformities, masses, or tenderness noted. Lungs:  Normal respiratory effort, chest expands symmetrically. Lungs are clear to auscultation, no crackles or wheezes. Heart:  normal rate and  regular rhythm.   Extremities:  trace nonpittiing edema.   Impression & Recommendations:  Problem # 1:  TEMPORAL ARTERITIS (ICD-446.5) repeat sed rate and hopefully taper off pred soon. Orders: Venipuncture (81191) TLB-Sedimentation Rate (ESR) (85652-ESR)  Problem # 2:  AODM (ICD-250.00) stable.  Suspect will be able to d/c when off prednisone. Her updated medication list for this problem includes:    Glimepiride 1 Mg Tabs (Glimepiride) ..... Once daily  Complete Medication List: 1)  Prednisone 5 Mg Tabs (Prednisone) .... Alternate 5mg  and 2.5mg  every other day for 3 weeks and then 2.5 mg for 3 weeks and then discontinue 2)  Glimepiride 1 Mg Tabs (Glimepiride) .... Once daily 3)  Methotrexate 2.5 Mg Tabs (Methotrexate sodium) .... 2 tabs by mouth once weekly 4)  Fosamax 70 Mg Tabs (Alendronate sodium) .... One tab by mouth weekly 5)  Calcium + D 250-125 Mg-unit Tabs (Calcium-vitamin d) .... Once daily 6)  Vitamin E 400 Unit Caps (Vitamin e) .... Once daily  Patient Instructions: 1)  Please schedule a follow-up appointment in 1 month.

## 2010-09-14 NOTE — Assessment & Plan Note (Signed)
Summary: 1 month follow up/cjr   Vital Signs:  Patient profile:   77 year old female Menstrual status:  postmenopausal Weight:      147 pounds O2 Sat:      96 % Temp:     97.5 degrees F oral Pulse rate:   81 / minute Pulse rhythm:   regular Resp:     16 per minute BP sitting:   150 / 60  Vitals Entered By: Lynann Beaver CMA (May 04, 2010 9:59 AM)  CC: rov Is Patient Diabetic? Yes Pain Assessment Patient in pain? no        History of Present Illness: Here for folllow up PMR.  Sed rate went up after recent reduction in prednisone. Headaches are overall better.  Pred at 10 mg daily. Still has some fatigue but overall increased family stress which she thinks is contributing.  Sleeping fairly well. Denies signif depressive symptoms.  CBGs stable on low dose prednisone.  Allergies: 1)  ! Penicillin V Potassium (Penicillin V Potassium) 2)  ! Sulfadiazine (Sulfadiazine)  Past History:  Past Medical History: Last updated: 11/18/2008 decreased hearing, hyperglycemia (2nd to chronic steroids), immunocomprimised ( 2nd to methotrexate_, intrathoracic LAD, multinodular thyroid, solitary L kidney (atrophic vs congenital), visual loss (2 to temporal arteritis) Kidney stones Temporal arteritis PMH reviewed for relevance  Review of Systems  The patient denies anorexia, fever, weight loss, and vision loss.    Physical Exam  General:  Well-developed,well-nourished,in no acute distress; alert,appropriate and cooperative throughout examination Head:  Normocephalic and atraumatic without obvious abnormalities. No apparent alopecia or balding. Eyes:  pupils equal, pupils round, and pupils reactive to light.   Mouth:  Oral mucosa and oropharynx without lesions or exudates.  Teeth in good repair. Neck:  No deformities, masses, or tenderness noted. Lungs:  Normal respiratory effort, chest expands symmetrically. Lungs are clear to auscultation, no crackles or wheezes. Heart:   normal rate and regular rhythm.   Skin:  no rashes.   Cervical Nodes:  No lymphadenopathy noted   Impression & Recommendations:  Problem # 1:  TEMPORAL ARTERITIS (ICD-446.5) repeat sed rate.   Orders: Sedimentation Rate, non-automated (07371) Specimen Handling (06269) Venipuncture (48546)  Problem # 2:  AODM (ICD-250.00) Assessment: Unchanged  Her updated medication list for this problem includes:    Glimepiride 1 Mg Tabs (Glimepiride) ..... Once daily  Complete Medication List: 1)  Prednisone 10 Mg Tabs (Prednisone) .... One by mouth q day 2)  Glimepiride 1 Mg Tabs (Glimepiride) .... Once daily 3)  Methotrexate 2.5 Mg Tabs (Methotrexate sodium) .... 2 tabs by mouth once weekly 4)  Fosamax 70 Mg Tabs (Alendronate sodium) .... One tab by mouth weekly 5)  Calcium + D 250-125 Mg-unit Tabs (Calcium-vitamin d) .... Once daily 6)  Vitamin E 400 Unit Caps (Vitamin e) .... Once daily 7)  Fluticasone Propionate 50 Mcg/act Susp (Fluticasone propionate) .... 2 sprays per nostril once daily as needed.  Other Orders: Flu Vaccine 24yrs + MEDICARE PATIENTS (E7035) Administration Flu vaccine - MCR (K0938)  Patient Instructions: 1)  Please schedule a follow-up appointment in 2 months.  Flu Vaccine Consent Questions     Do you have a history of severe allergic reactions to this vaccine? no    Any prior history of allergic reactions to egg and/or gelatin? no    Do you have a sensitivity to the preservative Thimersol? no    Do you have a past history of Guillan-Barre Syndrome? no    Do you currently  have an acute febrile illness? no    Have you ever had a severe reaction to latex? no    Vaccine information given and explained to patient? yes    Are you currently pregnant? no    Lot Number:[id number]   Exp Date:02/12/2011   Site Given  Left Deltoid IM 1)  Please schedule a follow-up appointment in 2 months.    Marland Kitchenlbmedflu  Laboratory Results   Blood Tests     SED rate: 8 mm/hr   Comments: Rita Ohara  May 04, 2010 11:14 AM

## 2010-09-14 NOTE — Assessment & Plan Note (Signed)
Summary: cough/dm   Vital Signs:  Patient profile:   77 year old female Menstrual status:  postmenopausal Weight:      149 pounds Temp:     97.4 degrees F oral BP sitting:   162 / 80  (left arm) Cuff size:   regular  Vitals Entered By: Sid Falcon LPN (June 24, 2010 1:06 PM)  History of Present Illness: Patient is nonsmoker seen with about two-week history of intermittent cough. Cough is mostly dry worse at night. She has only taken Tylenol without relief. Does have some nasal congestion. Denies sore throat or earache. No pleuritic pain or hemoptysis. No clear exacerbating factors  Allergies: 1)  ! Penicillin V Potassium (Penicillin V Potassium) 2)  ! Sulfadiazine (Sulfadiazine)  Past History:  Past Medical History: Last updated: 06/11/2010 decreased hearing, hyperglycemia (2nd to chronic steroids), immunocomprimised ( 2nd to methotrexate_, intrathoracic LAD, multinodular thyroid, solitary L kidney (atrophic vs congenital), visual loss (2 to temporal arteritis) Kidney stones Temporal arteritis Type 2 diabetes  Review of Systems      See HPI  Physical Exam  General:  Well-developed,well-nourished,in no acute distress; alert,appropriate and cooperative throughout examination Ears:  External ear exam shows no significant lesions or deformities.  Otoscopic examination reveals clear canals, tympanic membranes are intact bilaterally without bulging, retraction, inflammation or discharge. Hearing is grossly normal bilaterally. Mouth:  Oral mucosa and oropharynx without lesions or exudates.  Teeth in good repair. Neck:  No deformities, masses, or tenderness noted. Lungs:  Normal respiratory effort, chest expands symmetrically. Lungs are clear to auscultation, no crackles or wheezes. Heart:  normal rate and regular rhythm.     Impression & Recommendations:  Problem # 1:  COUGH (ICD-786.2)  Suspect acute viral bronchitis. Cough suppressant written.  Orders: Prescription  Created Electronically [phone])  Complete Medication List: 1)  Prednisone 5 Mg Tabs (Prednisone) .Marland Kitchen.. 1 and 1/2 tab daily, total 7.5mg  daily until 2 month return ov 2)  Glimepiride 1 Mg Tabs (Glimepiride) .... Once daily 3)  Methotrexate 2.5 Mg Tabs (Methotrexate sodium) .... 2 tabs by mouth once weekly 4)  Fosamax 70 Mg Tabs (Alendronate sodium) .... One tab by mouth weekly 5)  Calcium + D 250-125 Mg-unit Tabs (Calcium-vitamin d) .... Once daily 6)  Vitamin E 400 Unit Caps (Vitamin e) .... Once daily 7)  Fluticasone Propionate 50 Mcg/act Susp (Fluticasone propionate) .... 2 sprays per nostril once daily as needed. 8)  Hydrocodone-homatropine 5-1.5 Mg/67ml Syrp (Hydrocodone-homatropine) .... One tsp by mouth q 6 hours as needed cough  Patient Instructions: 1)  Acute Bronchitis symptoms for less then 10 days are not  helped by antibiotics. Take over the counter cough medications. Call if no improvement in 5-7 days, sooner if increasing cough, fever, or new symptoms ( shortness of breath, chest pain) .  Prescriptions: HYDROCODONE-HOMATROPINE 5-1.5 MG/5ML SYRP (HYDROCODONE-HOMATROPINE) one tsp by mouth q 6 hours as needed cough  #120 ml x 0   Entered and Authorized by:   Evelena Peat MD   Signed by:   Evelena Peat MD on 06/24/2010   Method used:   Print then Give to Patient   RxID:   [id number]    Orders Added: 1)  Est. Patient Level III [95621] 2)  Prescription Created Electronically [phone]

## 2010-09-14 NOTE — Assessment & Plan Note (Signed)
Summary: 3 month fup//ccm   Vital Signs:  Patient profile:   77 year old female Menstrual status:  postmenopausal Weight:      149 pounds BMI:     26.07 Temp:     97.8 degrees F oral Pulse rate:   80 / minute Pulse rhythm:   regular BP sitting:   148 / 80  (left arm) Cuff size:   regular  Vitals Entered By: Sid Falcon LPN (June 11, 2010 11:06 AM)  History of Present Illness: Patient here to followup multiple items as noted.  Left heel pain intermittently for several months. Worse first thing in the morning and on ambulation. None at rest. No injury. No alleviating factors. No visible swelling.  Not inhibiting ambulation.  Mild to moderate pain.  History elevated blood pressure but not treated. Does not monitor blood pressure at home. There is no headaches or dizziness.  History polymyalgia rheumatica. Currently alternating 10 mg and 7.5 mg prednisone. No recent headaches or visual disturbance. No significant myalgias.  Plan repeat sed rate today and taper if possible.  Type 2 diabetes well controlled. Fasting blood sugars around 100. Exacerbated by prednisone.  Allergies: 1)  ! Penicillin V Potassium (Penicillin V Potassium) 2)  ! Sulfadiazine (Sulfadiazine)  Past History:  Family History: Last updated: 11/18/2008 Family History of Arthritis Family History of Cardiovascular disorder Stroke  Social History: Last updated: 11/18/2008 Retired Widow/Widower Never Smoked Alcohol use-no  Risk Factors: Exercise: no (09/14/2009)  Risk Factors: Smoking Status: never (12/08/2009)  Past Medical History: decreased hearing, hyperglycemia (2nd to chronic steroids), immunocomprimised ( 2nd to methotrexate_, intrathoracic LAD, multinodular thyroid, solitary L kidney (atrophic vs congenital), visual loss (2 to temporal arteritis) Kidney stones Temporal arteritis Type 2 diabetes PMH-FH-SH reviewed for relevance  Review of Systems  The patient denies anorexia, fever,  weight loss, chest pain, syncope, dyspnea on exertion, peripheral edema, prolonged cough, headaches, hemoptysis, abdominal pain, melena, hematochezia, severe indigestion/heartburn, and muscle weakness.    Physical Exam  General:  Well-developed,well-nourished,in no acute distress; alert,appropriate and cooperative throughout examination  hard of hearing. Mouth:  Oral mucosa and oropharynx without lesions or exudates.  Teeth in good repair. Neck:  No deformities, masses, or tenderness noted. Lungs:  Normal respiratory effort, chest expands symmetrically. Lungs are clear to auscultation, no crackles or wheezes. Heart:  normal rate and regular rhythm.   Extremities:  No clubbing, cyanosis, edema, or deformity noted with normal full range of motion of all joints.  some tenderness over the left plantar fascia no visible swelling. Normal distal foot pulses Neurologic:  alert & oriented X3, cranial nerves II-XII intact, and strength normal in all extremities.    Diabetes Management Exam:    Foot Exam (with socks and/or shoes not present):       Sensory-Pinprick/Light touch:          Left medial foot (L-4): normal          Left dorsal foot (L-5): normal          Left lateral foot (S-1): normal          Right medial foot (L-4): normal          Right dorsal foot (L-5): normal          Right lateral foot (S-1): normal       Sensory-Monofilament:          Left foot: normal          Right foot: normal  Inspection:          Left foot: normal          Right foot: normal       Nails:          Left foot: normal          Right foot: normal   Impression & Recommendations:  Problem # 1:  FASCIITIS, PLANTAR (ICD-728.71) discussed icing and heel cups.  Steroid injection offered and she is not ready at this time.  Problem # 2:  ELEVATED BLOOD PRESSURE (ICD-796.2)  Problem # 3:  AODM (ICD-250.00) recheck A1C. Her updated medication list for this problem includes:    Glimepiride 1 Mg Tabs  (Glimepiride) ..... Once daily  Orders: Venipuncture (29562) Specimen Handling (13086) TLB-A1C / Hgb A1C (Glycohemoglobin) (83036-A1C)  Problem # 4:  TEMPORAL ARTERITIS (ICD-446.5) repeat sed rate and consider further slow tapering of pred if stable. Orders: Venipuncture (57846) Specimen Handling (96295) TLB-Sedimentation Rate (ESR) (85652-ESR)  Complete Medication List: 1)  Prednisone 10 Mg Tabs (Prednisone) .... Alternate 7.5mg /10/7.5/10 until next appt. 2)  Glimepiride 1 Mg Tabs (Glimepiride) .... Once daily 3)  Methotrexate 2.5 Mg Tabs (Methotrexate sodium) .... 2 tabs by mouth once weekly 4)  Fosamax 70 Mg Tabs (Alendronate sodium) .... One tab by mouth weekly 5)  Calcium + D 250-125 Mg-unit Tabs (Calcium-vitamin d) .... Once daily 6)  Vitamin E 400 Unit Caps (Vitamin e) .... Once daily 7)  Fluticasone Propionate 50 Mcg/act Susp (Fluticasone propionate) .... 2 sprays per nostril once daily as needed.  Patient Instructions: 1)  Please schedule a follow-up appointment in 3 months .  2)  Consider heel cup for symptom relief of plantar faciitis 3)  Consider steroid injection of heel if pain worsens.   Orders Added: 1)  Venipuncture [28413] 2)  Specimen Handling [99000] 3)  TLB-A1C / Hgb A1C (Glycohemoglobin) [83036-A1C] 4)  TLB-Sedimentation Rate (ESR) [85652-ESR] 5)  Est. Patient Level IV [24401]

## 2010-09-14 NOTE — Progress Notes (Signed)
Summary: Pt hasn't rcvd Glimipride script from [facility] yet  Phone Note Call from Patient Call back at [facility] Phone [phone]   Caller: Patient Summary of Call: Pt hasn't rcvd Glimepride from [facility] yet. [facility] [place] Pt completely out of med.  Initial call taken by: Lucy Antigua,  June 28, 2010 3:01 PM  Follow-up for Phone Call        Pt informed  Follow-up by: Sid Falcon LPN,  June 28, 2010 5:20 PM    Prescriptions: GLIMEPIRIDE 1 MG TABS (GLIMEPIRIDE) once daily  #30 x 0   Entered by:   Sid Falcon LPN   Authorized by:   Evelena Peat MD   Signed by:   Sid Falcon LPN on 09/81/1914   Method used:   [facility]  [place] [address]       [address]       [place], [place]  [address]       Ph: [phone] or [phone]       Fax: [phone]   RxID:   [id number]

## 2010-09-16 NOTE — Progress Notes (Signed)
Summary: new rx for methotrexate, OK x 6 months  Phone Note Refill Request Message from:  Fax from Pharmacy on August 12, 2010 4:05 PM  Refills Requested: Medication #1:  METHOTREXATE 2.5 MG TABS 2 tabs by mouth once weekly needs new rx - was [facility]order [facility] the past  [facility] (transfer prescription[facility]   Method Requested: Fax to Local Pharmacy Initial call taken by: Duard Brady LPN,  August 12, 2010 4:19 PM  Follow-up for Phone Call        OK to refill for 6 months. Follow-up by: Evelena Peat MD,  August 12, 2010 5:46 PM    Prescriptions: METHOTREXATE 2.5 MG TABS (METHOTREXATE SODIUM) 2 tabs by mouth once weekly  #36 x 3   Entered by:   Sid Falcon LPN   Authorized by:   Evelena Peat MD   Signed by:   Sid Falcon LPN on 16/05/9603   Method used:   [facility]  [place] [address] [facility]       [address]       [place], [place]  [address]       Ph: [phone] or [phone]       Fax: [phone]   RxID:   [id number]

## 2010-09-16 NOTE — Assessment & Plan Note (Signed)
Summary: 2 MTH ROV // RS/pt rescd from bump//ccm   Vital Signs:  Patient profile:   77 year old female Menstrual status:  postmenopausal Weight:      151 pounds Temp:     97.4 degrees F oral BP sitting:   122 / 62  (left arm) Cuff size:   regular  Vitals Entered By: Sid Falcon LPN (August 19, 2010 11:15 AM)  History of Present Illness: Hx polymalgia on low dose pred and MTX.   We have tried tapering off in past without success. Most recent sed rate 4.  No headache. Some chronic fatigue.  No myalgias.  Fasting glucose around 120 .  no symptoms of hyperglycemia. On low dose Amaryl 1 mg daily.  No diabetes prior to prednisone.  osteoporosis and not taking Fosamax regualry.  No recent falls.  Is  compliant with Vit D and Calcium.  Had some GERD sxs with Fosamax,  Allergies: 1)  ! Penicillin V Potassium (Penicillin V Potassium) 2)  ! Sulfadiazine (Sulfadiazine)  Past History:  Past Medical History: Last updated: 06/11/2010 decreased hearing, hyperglycemia (2nd to chronic steroids), immunocomprimised ( 2nd to methotrexate_, intrathoracic LAD, multinodular thyroid, solitary L kidney (atrophic vs congenital), visual loss (2 to temporal arteritis) Kidney stones Temporal arteritis Type 2 diabetes  Family History: Last updated: 11/18/2008 Family History of Arthritis Family History of Cardiovascular disorder Stroke  Social History: Last updated: 11/18/2008 Retired Widow/Widower Never Smoked Alcohol use-no  Risk Factors: Exercise: no (09/14/2009)  Risk Factors: Smoking Status: never (12/08/2009) PMH-FH-SH reviewed for relevance  Review of Systems  The patient denies anorexia, fever, weight loss, chest pain, syncope, dyspnea on exertion, peripheral edema, headaches, hemoptysis, abdominal pain, melena, hematochezia, and severe indigestion/heartburn.    Physical Exam  General:  Well-developed,well-nourished,in no acute distress; alert,appropriate and cooperative  throughout examination Head:  normocephalic and atraumatic.   Ears:  External ear exam shows no significant lesions or deformities.  Otoscopic examination reveals clear canals, tympanic membranes are intact bilaterally without bulging, retraction, inflammation or discharge. Hearing is grossly normal bilaterally. Mouth:  Oral mucosa and oropharynx without lesions or exudates.  Teeth in good repair. Neck:  No deformities, masses, or tenderness noted. Lungs:  Normal respiratory effort, chest expands symmetrically. Lungs are clear to auscultation, no crackles or wheezes. Heart:  normal rate and regular rhythm.   Extremities:  trace edema. Neurologic:  alert & oriented X3 and cranial nerves II-XII intact.     Impression & Recommendations:  Problem # 1:  AODM (ICD-250.00)  Her updated medication list for this problem includes:    Glimepiride 1 Mg Tabs (Glimepiride) ..... Once daily  Orders: Specimen Handling (52841) Venipuncture (32440) TLB-A1C / Hgb A1C (Glycohemoglobin) (83036-A1C) TLB-Sedimentation Rate (ESR) (85652-ESR)  Problem # 2:  TEMPORAL ARTERITIS (ICD-446.5) repeat sed rate and if stable, try slow pred taper again. Orders: Sedimentation Rate, non-automated (10272)  Problem # 3:  OSTEOPOROSIS, UNSPECIFIED (ICD-733.00)  Her updated medication list for this problem includes:    Fosamax 70 Mg Tabs (Alendronate sodium) ..... One tab by mouth weekly    Calcium + D 250-125 Mg-unit Tabs (Calcium-vitamin d) ..... Once daily  Complete Medication List: 1)  Prednisone 5 Mg Tabs (Prednisone) .... Take as directed 2)  Glimepiride 1 Mg Tabs (Glimepiride) .... Once daily 3)  Methotrexate 2.5 Mg Tabs (Methotrexate sodium) .... 2 tabs by mouth once weekly 4)  Fosamax 70 Mg Tabs (Alendronate sodium) .... One tab by mouth weekly 5)  Calcium + D 250-125 Mg-unit Tabs (  Calcium-vitamin d) .... Once daily 6)  Vitamin E 400 Unit Caps (Vitamin e) .... Once daily 7)  Fluticasone Propionate 50  Mcg/act Susp (Fluticasone propionate) .... 2 sprays per nostril once daily as needed. Prescriptions: PREDNISONE 5 MG TABS (PREDNISONE) take as directed  #45 x 3   Entered and Authorized by:   Evelena Peat MD   Signed by:   Evelena Peat MD on 08/19/2010   Method used:   [facility]  [place] [address] [facility]       [address]       [place], [place]  [address]       Ph: [phone] or [phone]       Fax: [phone]   RxID:   [id number]    Orders Added: 1)  Sedimentation Rate, non-automated [85651] 2)  Specimen Handling [99000] 3)  Venipuncture [36415] 4)  TLB-A1C / Hgb A1C (Glycohemoglobin) [83036-A1C] 5)  TLB-Sedimentation Rate (ESR) [85652-ESR] 6)  Est. Patient Level IV [64403]

## 2010-09-22 NOTE — Progress Notes (Signed)
Summary: Refill Glimepiride to [facility]  Phone Note Call from Patient   Caller: Patient Call For: Evelena Peat MD Summary of Call: Pt requesting refill Glimepiride as she is taking 2 tabs per Dr Caryl Never..  Send to [facility]    New/Updated Medications: GLIMEPIRIDE 1 MG TABS (GLIMEPIRIDE) two  tabs daily Prescriptions: GLIMEPIRIDE 1 MG TABS (GLIMEPIRIDE) two  tabs daily  #180 x 3   Entered by:   Sid Falcon LPN   Authorized by:   Evelena Peat MD   Signed by:   Sid Falcon LPN on 16/05/9603   Method used:   [facility]  [place] [address]       [address]       [place], [place]  [address]       Ph: [phone] or [phone]       Fax: [phone]   RxID:   [id number]

## 2010-10-15 ENCOUNTER — Encounter: Payer: Self-pay | Admitting: Family Medicine

## 2010-10-18 ENCOUNTER — Ambulatory Visit (INDEPENDENT_AMBULATORY_CARE_PROVIDER_SITE_OTHER): Payer: Medicare Other | Admitting: Family Medicine

## 2010-10-18 ENCOUNTER — Encounter: Payer: Self-pay | Admitting: Family Medicine

## 2010-10-18 DIAGNOSIS — M316 Other giant cell arteritis: Secondary | ICD-10-CM

## 2010-10-18 DIAGNOSIS — E119 Type 2 diabetes mellitus without complications: Secondary | ICD-10-CM

## 2010-10-18 LAB — SEDIMENTATION RATE: Sed Rate: 12 mm/hr (ref 0–22)

## 2010-10-18 MED ORDER — PREDNISONE 5 MG PO TABS: 5.0000 mg | ORAL_TABLET | Freq: Every day | ORAL | Status: DC

## 2010-10-18 NOTE — Progress Notes (Signed)
  Subjective:    Patient ID: [name], female    DOB: 05-03-1923, 77 y.o.   MRN: [id number]  HPI  Patient seen for followup. History of temporal arteritis and polymyalgia. Currently prednisone 7.5 mg alternating with 5 mg. Also on low-dose methotrexate. Some occasional mild headaches. No acute visual disturbance. Intermittent mild vertigo. Blood sugars ranging around 100 fasting. Recent A1c 7.5%. Takes glimepiride 1 mg tablet 2 tablets daily. No symptoms of hyperglycemia.   Review of Systems     Objective:   Physical Exam  patient is alert and in no distress. Repeat blood pressure at rest 142/72  Oropharynx is clear Chest good auscultation Heart regular rhythm and rate Extremities no edema  Neuro no strength deficits. Gait is normal       Assessment & Plan:   #1 history of temporal arteritis/polymyalgia rheumatica. Symptomatically stable. Reassess sedimentation rate today. Try to continue slow prednisone taper #2 type 2 diabetes. Stable by home readings. Recent A1c slightly elevated. Repeat in 3 months.

## 2010-10-19 ENCOUNTER — Telehealth: Payer: Self-pay | Admitting: *Deleted

## 2010-10-19 MED ORDER — GLIMEPIRIDE 1 MG PO TABS: 1.0000 mg | ORAL_TABLET | Freq: Two times a day (BID) | ORAL | Status: DC

## 2010-10-19 NOTE — Telephone Encounter (Signed)
Pt informed of 5mg  prednisone daily, return in 3 months for repeat labs and OV, filled glimepiride to 2 tabs daily per pr request, up from 1 tab to 2 daily

## 2010-10-19 NOTE — Progress Notes (Signed)
Addended by: Sid Falcon on: 10/19/2010 04:01 PM   Modules accepted: Orders

## 2010-10-27 ENCOUNTER — Ambulatory Visit: Payer: Medicare Other | Admitting: Family Medicine

## 2010-10-27 ENCOUNTER — Emergency Department (HOSPITAL_COMMUNITY)
Admission: EM | Admit: 2010-10-27 | Discharge: 2010-10-27 | Disposition: A | Discharge status: Home / Self Care | Payer: Medicare Other | Attending: Emergency Medicine | Admitting: Emergency Medicine

## 2010-10-27 ENCOUNTER — Emergency Department (HOSPITAL_COMMUNITY): Payer: Medicare Other

## 2010-10-27 DIAGNOSIS — J069 Acute upper respiratory infection, unspecified: Secondary | ICD-10-CM | POA: Insufficient documentation

## 2010-10-27 DIAGNOSIS — E119 Type 2 diabetes mellitus without complications: Secondary | ICD-10-CM | POA: Insufficient documentation

## 2010-10-27 DIAGNOSIS — R0982 Postnasal drip: Secondary | ICD-10-CM | POA: Insufficient documentation

## 2010-10-27 DIAGNOSIS — IMO0001 Reserved for inherently not codable concepts without codable children: Secondary | ICD-10-CM | POA: Insufficient documentation

## 2010-10-27 DIAGNOSIS — J3489 Other specified disorders of nose and nasal sinuses: Secondary | ICD-10-CM | POA: Insufficient documentation

## 2010-10-27 DIAGNOSIS — R05 Cough: Secondary | ICD-10-CM | POA: Insufficient documentation

## 2010-10-27 DIAGNOSIS — R059 Cough, unspecified: Secondary | ICD-10-CM | POA: Insufficient documentation

## 2010-10-27 DIAGNOSIS — J029 Acute pharyngitis, unspecified: Secondary | ICD-10-CM | POA: Insufficient documentation

## 2010-10-27 LAB — GLUCOSE, CAPILLARY: Glucose-Capillary: 122 mg/dL — ABNORMAL HIGH (ref 70–99)

## 2010-11-04 ENCOUNTER — Ambulatory Visit (INDEPENDENT_AMBULATORY_CARE_PROVIDER_SITE_OTHER): Payer: Medicare Other | Admitting: Family Medicine

## 2010-11-04 ENCOUNTER — Encounter: Payer: Self-pay | Admitting: Family Medicine

## 2010-11-04 VITALS — BP 148/78 | Temp 97.7°F | Wt 150.0 lb

## 2010-11-04 DIAGNOSIS — R05 Cough: Secondary | ICD-10-CM

## 2010-11-04 NOTE — Patient Instructions (Signed)
Follow up immediately for any fever or shortness of breath.

## 2010-11-04 NOTE — Progress Notes (Signed)
  Subjective:    Patient ID: [name], female    DOB: [birth year], 77 y.o.   MRN: [id number]  HPI  patient seen for ER followup. Presented there  On the 14th of this month with progressive cough and nasal congestion. No fever. Patient had chest x-ray which showed no acute disease. No pneumonia. Patient was prescribed Zithromax for 5 days and given prescription for Tussionex cough syrup.  She already had some hydrocodone cough syrup at home and did not get his prescription filled. Overall feels much better this time. Still has occasional cough but is sleeping okay. No fever or shortness of breath. No orthopnea. Nasal congestion has improved somewhat. Nonsmoker.   Review of Systems  Constitutional: Negative for fever and chills.  HENT: Positive for postnasal drip. Negative for sinus pressure.   Respiratory: Positive for cough. Negative for chest tightness and wheezing.   Cardiovascular: Negative for chest pain and leg swelling.  Genitourinary: Negative for dysuria.  Neurological: Negative for headaches.       Objective:   Physical Exam  Constitutional: She appears well-developed and well-nourished. No distress.  HENT:  Head: Normocephalic.  Right Ear: External ear normal.  Left Ear: External ear normal.  Neck: Neck supple.  Cardiovascular: Normal rate and regular rhythm.   Pulmonary/Chest: Effort normal and breath sounds normal. No respiratory distress. She has no wheezes. She has no rales.  Musculoskeletal: She exhibits no edema.  Lymphadenopathy:    She has no cervical adenopathy.          Assessment & Plan:   recent cough probably related to viral bronchitis. She has already been treated with antibiotic and symptoms are slowly improving. Continue cough medication as needed for symptomatic release. Followup immediately for any fever or shortness of breath

## 2010-12-21 ENCOUNTER — Other Ambulatory Visit: Payer: Self-pay | Admitting: Family Medicine

## 2010-12-31 NOTE — H&P (Signed)
[name], [name]                 ACCOUNT NO.:  [id number]   MEDICAL RECORD NO.:  [id number]          PATIENT TYPE:  EMS   LOCATION:  ED                           FACILITY:  [facility]   PHYSICIAN:  Hettie Holstein, D.O.    DATE OF BIRTH:  November 24, 1922   DATE OF ADMISSION:  08/19/2006  DATE OF DISCHARGE:                              HISTORY & PHYSICAL   PRIMARY CARE PHYSICIAN:  Dr. Caryl Never of [facility].   RHEUMATOLOGIST:  Dr. Phylliss Bob.   HISTORY OF PRESENT ILLNESS:  [name] is a very pleasant 77 year old  independently living Caucasian female with a known history of temporal  arteritis on chronic prednisone therapy. She was at her home, stepping  over a wall surrounding her garage today, and tripped and sustained a  fall; she landed on her left side, with an abrasion of her left elbow.  She was evaluated on the emergency department and discovered to have  sustained a left superior inferior pelvic rami fracture which was non  displaced. She developed some considerable pain with movement. She is  being admitted for pain management in addition to rehab evaluation.   PAST MEDICAL HISTORY:  1. As noted above, this is significant for temporal arteritis. She      will be managed by Dr. Phylliss Bob. She is chronically on prednisone      therapy.  2. She has a history of lymphadenopathy discovered on chest CT      followed by Dr. Delton Coombes at which time she declined further      evaluation.  3. She has a history of multi-nodular thyroid.  4. She has a history of one kidney discovered about a year or so ago.      Uncertain if this was atrophy or congenital.   MEDICATIONS AT HOME:  [name] [place]  1. Prednisone 10 mg q.24 hours.  2. Glyburide 4 mg daily.  3. Evista 60 mg daily.  4. Calcium supplement and vitamin E supplement.   ALLERGIES:  PENICILLIN AND SULFA.   SOCIAL HISTORY:  Her daughter, Helmut Muster may be reached at [phone]. She  denies tobacco or alcohol. She  is retired from [employer]. She has two  children. She has no Living Will in place although she states that she  is not receptive to heroic efforts to keep her alive including tube  feeds, mechanical ventilation, CPR.   REVIEW OF SYSTEMS:  She has been in her usual state of health. She  continues to drive, ambulate in her home. She lives alone. She has had  no nausea, vomiting or diarrhea. She has had some headaches but most  specifically this is recent.   PHYSICAL EXAMINATION:  VITAL SIGNS: Stable in the emergency department.  She was afebrile. Heart rate was 87, respirations 20, O2 saturation 97%.  GENERAL: The patient is alert and oriented x3.  HEENT: Head is normocephalic, atraumatic.  NECK: Supple, no palpable tenderness. She does have a palpable enlarged  thyroid.  CARDIOVASCULAR: Normal S1 and S2 without appreciable murmur.  LUNGS: She exhibits normal effort, there is  no dullness to percussion.  ABDOMEN: Soft and nontender.  EXTREMITIES: Reveal no edema. She does have an abrasion on her left  elbow.  NEUROLOGIC EXAM: She reveals no focal neurologic deficit.   ASSESSMENT:  1. Super inferior pubic rami fracture.  2. Status post fall.  3. Temporal arteritis.  4. Chronic prednisone therapy.  5. Steroid induced hyperglycemia.  6. 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   PLAN:  [name] will be admitted for pain management in addition to  rehab consultation for potential rehabilitation admission. Will continue  her home medications as well as pain medication and bowel regimen to  assure she does not have constipation.      Hettie Holstein, D.O.  Electronically Signed     ESS/MEDQ  D:  08/19/2006  T:  08/19/2006  Job:  [id number]   cc:   Areatha Keas, M.D.  Fax: [phone]   Evelena Peat, M.D.  Fax: [phone]

## 2010-12-31 NOTE — Assessment & Plan Note (Signed)
[facility]                               PULMONARY OFFICE NOTE   NAME:Staiger, AYVAH CAROLL                        MRN:          [id number]  DATE:04/14/2006                            DOB:          24-Aug-1922    PULMONARY FOLLOW-UP VISIT:   SUBJECTIVE:  Ms. Broshears is an 77 year old woman who has been seen in the past  by Dr. Craige Cotta for nasal congestion.  Her evaluation revealed an opacity at the  left base on chest x-ray and follow-up CT scan showed this to be an area of  fatty herniation not consistent with malignancy.  She also was felt to have  a multinodular thyroid.  Further evaluation was offered to Ms. Scaletta for  both of these issues in March 2007, but she deferred stating that she was  feeling well.  At that time, in fact, her congestion was much improved.  She  returns today in regularly-scheduled follow-up.  She continues to do well  from the standpoint of her postnasal drip and congestion.  She denies any  dyspnea.  She does say that she has no energy, weakness, and that she is  sleeping all the time.  She is concerned about living alone and says she  has nothing to get up and go for.  She denies any overt symptoms of  obstructive sleep apnea, although she does nap frequently during the day.  She is unsure as to whether she snores or stops breathing at night.  Her  postnasal drip is occasionally bothersome but it is well-controlled at this  time.   MEDICATIONS:  1. Methotrexate 2.5 mg daily.  2. __________  4 mg daily.  3. Evista 60 mg daily.  4. Prednisone 10 mg daily.  5. Vitamin E 400 international units daily.  6. Vitamin [name] one daily.  7. Tylenol p.r.n.   PHYSICAL EXAMINATION:  GENERAL:  This is a well-appearing woman with a  slightly depressed affect.  VITAL SIGNS:  Weight 153 pounds, temperature 97.9, blood pressure 112/62,  heart rate 80, SPO2 96% on room air.  LUNGS:  Clear to auscultation bilaterally.  ABDOMEN:  Obese, soft,  nontender, with positive bowel sounds.  CARDIAC:  Regular rate and rhythm without murmur.  EXTREMITIES:  No cyanosis, clubbing or edema.   IMPRESSION:  1. History of allergic rhinitis and nasal congestion, which appears to be      fairly well-controlled.  2. Lymphadenopathy and fatty herniation on CT scan of the chest.  The      patient has chosen in the past not to follow this with serial CT scans      and instead follow her clinical status.  She continues to feel about      the same, does not want to initiate any further investigation.  3. Multinodular thyroid.  Question whether thyroid disease may be related      to her fatigue and weakness.  I offered to check thyroid studies and to      initiate a workup.  She said that she would rather have this evaluated  by Dr. Caryl Never.   At this time I do not believe that there is any further indication for  follow-up with [place] unless she develops any new pulmonary complaints or if her  cough and congestion recur.  If she chooses to have the repeat CT scan of  the chest, then we will be happy to see her in follow-up to review the  results of this study.                                   Leslye Peer, MD   RSB/MedQ  DD:  04/14/2006  DT:  04/15/2006  Job #:  [id number]   cc:   Evelena Peat, MD  Areatha Keas, MD

## 2010-12-31 NOTE — Op Note (Signed)
   NAMENIANNA, IGO                           ACCOUNT NO.:  [id number]   MEDICAL RECORD NO.:  [id number]                   PATIENT TYPE:  AMB   LOCATION:  ENDO                                 FACILITY:  [facility]   PHYSICIAN:  Anselmo Rod, M.D.               DATE OF BIRTH:  04-Nov-1922   DATE OF PROCEDURE:  03/17/2003  DATE OF DISCHARGE:                                 OPERATIVE REPORT   PROCEDURE PERFORMED:  Screening colonoscopy.   ENDOSCOPIST:  Charna Elizabeth, M.D.   INSTRUMENT USED:  Olympus video colonoscope.   INDICATIONS FOR PROCEDURE:  The patient is a 77 year old white female with a  personal history of leukemia undergoing screening colonoscopy.  Rule out  colonic polyps, masses, etc.   PREPROCEDURE PREPARATION:  Informed consent was procured from the patient.  The patient was fasted for eight hours prior to the procedure and prepped  with a bottle of magnesium citrate and a gallon of GoLYTELY the night prior  to the procedure.   PREPROCEDURE PHYSICAL:  The patient had stable vital signs.  Neck supple.  Chest clear to auscultation.  S1 and S2 regular.  Abdomen soft with normal  bowel sounds.   DESCRIPTION OF PROCEDURE:  The patient was placed in left lateral decubitus  position and sedated with 40 mg of Demerol and 4 mg of Versed intravenously.  Once the patient was adequately sedated and maintained on low flow oxygen  and continuous cardiac monitoring, the Olympus video colonoscope was  advanced  from the rectum to the cecum.  There was some residual stool in  the colon.  Multiple washes were done.  The appendicular orifice and  ileocecal valve were clearly visualized and photographed.  No masses,  polyps, erosions, ulcerations or diverticula were seen. Small internal  hemorrhoids were appreciated on retroflexion in the rectum.  The patient  tolerated the procedure well without complication.   IMPRESSION:  Normal colonoscopy except for a small nonbleeding internal  hemorrhoid.  No masses or polyps seen.   RECOMMENDATIONS:  1. A high fiber diet with liberal fluid intake has been recommended.  2. Repeat colorectal cancer screening is recommended in the next five years     unless the patient develops any abnormal symptoms in the interim.  3. Outpatient follow-up as need arises in the future.                                                   Anselmo Rod, M.D.    JNM/MEDQ  D:  03/17/2003  T:  03/17/2003  Job:  [id number]   cc:   Tammy R. Collins Scotland, M.D.  [address]  [place]  [place] [address]  Fax: [phone]

## 2010-12-31 NOTE — Discharge Summary (Signed)
NAMESHEREECE, WELLBORN                 ACCOUNT NO.:  [id number]   MEDICAL RECORD NO.:  [id number]          PATIENT TYPE:  INP   LOCATION:  1615                         FACILITY:  [facility]   PHYSICIAN:  Isidor Holts, M.D.  DATE OF BIRTH:  05/09/1923   DATE OF ADMISSION:  08/19/2006  DATE OF DISCHARGE:                               DISCHARGE SUMMARY   DATE OF DISCHARGE:  To be determined.   PRIMARY CARE PHYSICIAN:  Evelena Peat, M.D., [facility].   RHEUMATOLOGIST:  Areatha Keas, M.D.   DISCHARGE DIAGNOSES:  1. Status post fall, with fracture of left inferior and superior pubic      rami, also left elbow abrasion.  2. History of temporal arteritis, on chronic steroid treatment.  3. Euthyroid multinodular goiter.  4. History of solitary left kidney.  5. Left eye hemianopsia (longstanding).   DISCHARGE MEDICATIONS:  1. Evista 60 mg p.o. daily.  2. Os-Cal with vitamin D 500 mg one tablet p.o. b.i.d.  3. Senokot-S one p.o. q.h.s.  4. Prednisone 20 mg p.o. daily until August 27, 2006, then 10 mg p.o.      daily maintenance.  5. Roxicodone 5 mg p.o. t.i.d.  6. Colace 100 mg p.o. b.i.d.  7. Lisinopril 5 mg p.o. daily.   Note, Glyburide has been discontinued.   PROCEDURE:  1. Two-view chest x-ray dated August 19, 2006.  This showed      hyperaeration, no active lung disease, stable borderline      cardiomegaly.  2. X-ray of left hip dated August 19, 2006.  This showed nondisplaced      fractures of the left superior and inferior pubic rami.  3. Left elbow x-ray dated August 19, 2006.  Four views of the left      elbow show no fracture or effusion.  Alignment is normal.  4. Venous Doppler lower extremities dated August 22, 2006.  This      showed no evidence of DVT.   CONSULTATIONS:  None.   HISTORY OF PRESENT ILLNESS:  In an H&P of August 19, 2006, dictated by  Hettie Holstein, D.O. However, briefly this is an 77 year old female, with  known history of temporal  arteritis on chronic prednisone therapy,  history of intrathoracic lymphadenopathy, multinodular euthyroid goiter,  solitary left kidney, and chronic left eye hemianopsia, who presented  after she tripped and fell on her left side, sustaining an abrasion of  her left elbow.  Imaging studies done in the emergency department  confirmed left superior and inferior pubic rami fractures.  X-ray of the  left elbow showed no bony injuries.  The patient was admitted for  further evaluation, investigation, and management.   CLINICAL COURSE:  Problem 1.  Pelvic fractures.  This was managed  conservatively with analgesics, physical therapy/occupational therapy  and rehabilitation.   Problem 2.  Chronic steroid treatment.  The patient is a known case of  temporal arteritis, under the care of Dr. Phylliss Bob, rheumatologist.  At the  time of presentation, she was on prednisone 10 mg p.o. daily and this  was  increased to 20 mg p.o. daily, ie, stress dosage to cover acute  injury.  This is to be continued until August 27, 2006, after which it  is recommended that she will revert to her usual maintenance dose of 10  mg p.o. daily.   Problem 3.  Steroid-induced hyperglycemia.  The patient is on chronic  prednisone treatment and was prior to presentation, on 4 mg of  Glyburide.  During the course of her hospitalization, however, her blood  glucose has been on the low normal side.  Glyburide has therefore been  held.  We will expect the patient's primary doctor to continue to  monitor her glycemia.  He may reintroduce Glyburide at a later date if  indicated, at his own discretion.   DISPOSITION:  There were no acute problems, during the course of the  patient's hospitalization.  Constipation which was in part due to  diminished mobility and pain management, was addressed with appropriate  stool softeners/laxatives.  The patient was noted to have left lower  extremity edema on August 22, 2006, however, venous  Doppler revealed no  evidence of DVT.  She has now been supplied with bilateral lower  extremity TED stockings.  Blood pressure was mildly elevated during the  course of this hospitalization.  As a matter of fact, blood pressure was  152/66 on August 22, 2006, and also 154/81 on August 23, 2006.  ACE  inhibitor has therefore been commenced in an initial dose of 5 mg p.o.  daily.  We will expect the patient's primary M.D. to monitor her blood  pressure and adjust medications as indicated.  The patient has been  evaluated by the rehab team as well as PT/OT.  Recommendation is that  she will benefit from a spell of rehabilitation in a skilled nursing  facility. Arrangements are ongoing therefore, to effect this.  Once  arrangements are concluded, the patient will be discharged accordingly.  Final disposition will be elucidated in addendum at the appropriate  time, by discharging M.D.      Isidor Holts, M.D.  Electronically Signed     CO/MEDQ  D:  08/23/2006  T:  08/23/2006  Job:  [id number]   cc:   Areatha Keas, M.D.  Fax: [phone]   Evelena Peat, M.D.  Fax: [phone]

## 2011-01-19 ENCOUNTER — Encounter: Payer: Self-pay | Admitting: Family Medicine

## 2011-01-19 ENCOUNTER — Ambulatory Visit (INDEPENDENT_AMBULATORY_CARE_PROVIDER_SITE_OTHER): Payer: Medicare Other | Admitting: Family Medicine

## 2011-01-19 DIAGNOSIS — M316 Other giant cell arteritis: Secondary | ICD-10-CM

## 2011-01-19 DIAGNOSIS — E119 Type 2 diabetes mellitus without complications: Secondary | ICD-10-CM

## 2011-01-19 DIAGNOSIS — M810 Age-related osteoporosis without current pathological fracture: Secondary | ICD-10-CM

## 2011-01-19 LAB — HEMOGLOBIN A1C: Hgb A1c MFr Bld: 7.2 % — ABNORMAL HIGH (ref 4.6–6.5)

## 2011-01-19 NOTE — Progress Notes (Signed)
  Subjective:    Patient ID: [name], female    DOB: 02/25/1923, 77 y.o.   MRN: [id number]  HPI Patient seen for medical followup. She has history of type 2 diabetes. Temporal arteritis and we have been trying to slowly taper her prednisone. She also remains on methotrexate 2.5 mg 2 tablets weekly. Prednisone currently 5 mg daily. She has some chronic left visual loss from her temporal arteritis. No recent severe headaches. She has occasional mild headaches. Symptoms of thirst. Has some urge urinary incontinence. Recent A1c 7.5%. Amaryl titrated to 2 mg daily. She does not monitor her blood sugars regularly. When checked usually around 110 -115 fasting.  Osteoporosis history.  No recent falls.  Remains on fosamax with no active GERD symptoms. Ca and Vit D supplements.   Review of Systems  Constitutional: Negative for fever, chills, activity change, fatigue and unexpected weight change.  Respiratory: Negative for cough and shortness of breath.   Cardiovascular: Negative for chest pain, palpitations and leg swelling.  Genitourinary: Negative for dysuria.  Neurological: Negative for dizziness, syncope and headaches.  Hematological: Negative for adenopathy. Does not bruise/bleed easily.       Objective:   Physical Exam  Constitutional: She is oriented to person, place, and time. She appears well-developed and well-nourished. No distress.  Cardiovascular: Normal rate and regular rhythm.   Pulmonary/Chest: Effort normal and breath sounds normal. No respiratory distress. She has no wheezes. She has no rales.  Musculoskeletal: She exhibits no edema.  Neurological: She is alert and oriented to person, place, and time. No cranial nerve deficit.  Psychiatric: She has a normal mood and affect. Her behavior is normal.          Assessment & Plan:  #1 type 2 diabetes. Recent poor control. Reassess A1c today #2 temporal arteritis history. Reassess sedimentation rate. Try continue prednisone  taper and eventually hopefully off methotrexate and prednisone.

## 2011-01-21 NOTE — Progress Notes (Signed)
Quick Note:  Given Pt HGB A1C results. ______

## 2011-03-28 ENCOUNTER — Telehealth: Payer: Self-pay | Admitting: Family Medicine

## 2011-03-28 ENCOUNTER — Inpatient Hospital Stay (HOSPITAL_COMMUNITY)
Admission: EM | Admit: 2011-03-28 | Discharge: 2011-03-31 | DRG: 249 | Disposition: A | Discharge status: Home / Self Care | Payer: Medicare Other | Attending: Interventional Cardiology | Admitting: Interventional Cardiology

## 2011-03-28 DIAGNOSIS — Z882 Allergy status to sulfonamides status: Secondary | ICD-10-CM

## 2011-03-28 DIAGNOSIS — I2119 ST elevation (STEMI) myocardial infarction involving other coronary artery of inferior wall: Principal | ICD-10-CM | POA: Diagnosis present

## 2011-03-28 DIAGNOSIS — Q602 Renal agenesis, unspecified: Secondary | ICD-10-CM

## 2011-03-28 DIAGNOSIS — M316 Other giant cell arteritis: Secondary | ICD-10-CM | POA: Diagnosis present

## 2011-03-28 DIAGNOSIS — N269 Renal sclerosis, unspecified: Secondary | ICD-10-CM | POA: Diagnosis present

## 2011-03-28 DIAGNOSIS — E119 Type 2 diabetes mellitus without complications: Secondary | ICD-10-CM | POA: Diagnosis present

## 2011-03-28 DIAGNOSIS — Z880 Allergy status to penicillin: Secondary | ICD-10-CM

## 2011-03-28 NOTE — Telephone Encounter (Signed)
Pt called and has sch ov for this wed 03/30/11 at 2:30pm. Pt has had headaches,body aches and earache for a few days. Pt is req work in ov sooner that Wed afternoon. Pls advise.

## 2011-03-29 ENCOUNTER — Ambulatory Visit: Payer: Medicare Other | Admitting: Family Medicine

## 2011-03-29 ENCOUNTER — Telehealth: Payer: Self-pay | Admitting: Family Medicine

## 2011-03-29 LAB — COMPREHENSIVE METABOLIC PANEL
Albumin: 2.8 g/dL — ABNORMAL LOW (ref 3.5–5.2)
BUN: 14 mg/dL (ref 6–23)
Calcium: 8.3 mg/dL — ABNORMAL LOW (ref 8.4–10.5)
Chloride: 107 mEq/L (ref 96–112)
Creatinine, Ser: 0.67 mg/dL (ref 0.50–1.10)
GFR calc non Af Amer: >60 mL/min (ref >60)
Total Bilirubin: 0.2 mg/dL — ABNORMAL LOW (ref 0.3–1.2)

## 2011-03-29 LAB — POCT ACTIVATED CLOTTING TIME: Activated Clotting Time: 408 seconds

## 2011-03-29 LAB — BASIC METABOLIC PANEL
Calcium: 8.8 mg/dL (ref 8.4–10.5)
GFR calc non Af Amer: >60 mL/min (ref >60)
Potassium: 4.2 mEq/L (ref 3.5–5.1)
Sodium: 139 mEq/L (ref 135–145)

## 2011-03-29 LAB — POCT I-STAT, CHEM 8
HCT: 34 % — ABNORMAL LOW (ref 36.0–46.0)
Hemoglobin: 11.6 g/dL — ABNORMAL LOW (ref 12.0–15.0)
Potassium: 3.9 mEq/L (ref 3.5–5.1)
Sodium: 140 mEq/L (ref 135–145)

## 2011-03-29 LAB — URINALYSIS, ROUTINE W REFLEX MICROSCOPIC
Bilirubin Urine: NEGATIVE
Hgb urine dipstick: NEGATIVE
Nitrite: NEGATIVE
Specific Gravity, Urine: >1.046 — ABNORMAL HIGH (ref 1.005–1.030)
Urobilinogen, UA: 0.2 mg/dL (ref 0.0–1.0)

## 2011-03-29 LAB — CBC
HCT: 32.5 % — ABNORMAL LOW (ref 36.0–46.0)
Hemoglobin: 10.9 g/dL — ABNORMAL LOW (ref 12.0–15.0)
MCH: 32.1 pg (ref 26.0–34.0)
MCHC: 33.5 g/dL (ref 30.0–36.0)
MCV: 95.6 fL (ref 78.0–100.0)

## 2011-03-29 LAB — CARDIAC PANEL(CRET KIN+CKTOT+MB+TROPI)
CK, MB: 51.1 ng/mL (ref 0.3–4.0)
CK, MB: 66.9 ng/mL (ref 0.3–4.0)
Relative Index: 8.6 — ABNORMAL HIGH (ref 0.0–2.5)
Relative Index: 8.2 — ABNORMAL HIGH (ref 0.0–2.5)
Total CK: 595 U/L — ABNORMAL HIGH (ref 7–177)
Total CK: 1032 U/L — ABNORMAL HIGH (ref 7–177)
Troponin I: >25 ng/mL (ref <0.30)

## 2011-03-29 LAB — HEMOGLOBIN A1C: Mean Plasma Glucose: 160 mg/dL — ABNORMAL HIGH (ref <117)

## 2011-03-29 LAB — PROTIME-INR
INR: 1.72 — ABNORMAL HIGH (ref 0.00–1.49)
Prothrombin Time: 20.5 seconds — ABNORMAL HIGH (ref 11.6–15.2)

## 2011-03-29 LAB — APTT: aPTT: 71 seconds — ABNORMAL HIGH (ref 24–37)

## 2011-03-29 LAB — LIPID PANEL
Cholesterol: 188 mg/dL (ref 0–200)
HDL: 63 mg/dL (ref >39)
Triglycerides: 58 mg/dL (ref <150)
VLDL: 12 mg/dL (ref 0–40)

## 2011-03-29 NOTE — Telephone Encounter (Signed)
FYI

## 2011-03-29 NOTE — Telephone Encounter (Signed)
Per Dr Caryl Never, he received notice pt had MI last night, is admitted to hospital.  Cancelled appt tomorrow

## 2011-03-29 NOTE — Telephone Encounter (Signed)
Pts daughter called and said that her mom has heart attack last night. Pt was admitted to [facility] ICU unit 2907. Stents have been put in and one of her arteries was 100% blocked. Cardiologist, Dr Verdis Prime, can be contacted for more info. Today pt sounds better. Pt is sitting up and eating a little.

## 2011-03-30 ENCOUNTER — Ambulatory Visit: Payer: Medicare Other | Admitting: Family Medicine

## 2011-03-30 LAB — CBC
MCH: 32.4 pg (ref 26.0–34.0)
MCHC: 33.2 g/dL (ref 30.0–36.0)
MCV: 97.3 fL (ref 78.0–100.0)
Platelets: 141 10*3/uL — ABNORMAL LOW (ref 150–400)
RBC: 3.77 MIL/uL — ABNORMAL LOW (ref 3.87–5.11)
RDW: 14 % (ref 11.5–15.5)

## 2011-03-30 LAB — BASIC METABOLIC PANEL
CO2: 29 mEq/L (ref 19–32)
Calcium: 9.3 mg/dL (ref 8.4–10.5)
Creatinine, Ser: 0.6 mg/dL (ref 0.50–1.10)

## 2011-03-30 LAB — CARDIAC PANEL(CRET KIN+CKTOT+MB+TROPI): Relative Index: 5.8 — ABNORMAL HIGH (ref 0.0–2.5)

## 2011-03-30 LAB — PRO B NATRIURETIC PEPTIDE: Pro B Natriuretic peptide (BNP): 3235 pg/mL — ABNORMAL HIGH (ref 0–450)

## 2011-03-31 LAB — BASIC METABOLIC PANEL
CO2: 29 mEq/L (ref 19–32)
Calcium: 9.5 mg/dL (ref 8.4–10.5)
GFR calc non Af Amer: >60 mL/min (ref >60)
Sodium: 142 mEq/L (ref 135–145)

## 2011-03-31 LAB — CK TOTAL AND CKMB (NOT AT ARMC): Relative Index: 4.9 — ABNORMAL HIGH (ref 0.0–2.5)

## 2011-04-15 NOTE — Cardiovascular Report (Signed)
[name], [name]                 ACCOUNT NO.:  [id number]  MEDICAL RECORD NO.:  [id number]  LOCATION:  2907                         FACILITY:  [facility]  PHYSICIAN:  Lyn Records, M.D.   DATE OF BIRTH:  August 15, 1923  DATE OF PROCEDURE:  03/29/2011 DATE OF DISCHARGE:                           CARDIAC CATHETERIZATION   INDICATIONS FOR PROCEDURE:  Acute inferior ST elevation myocardial infarction commencing at 10:00 p.m. on March 28, 2011, with first EKG by EMS at 10:24 p.m. on March 28, 2011.  PROCEDURE: 1. Emergency left heart catheterization via the right femoral. 2. Selective coronary angiography. 3. Left ventriculography. 4. Bare-metal stent implantation x2 in the right coronary.  DESCRIPTION:  On the emergency consent, the patient was brought to the catheterization laboratory straight from the emergency room where she presented via EMS.  She was never checked into the emergency room but taken straight upstairs.  History was obtained on the way to the catheterization laboratory.  Please see the admitting history and physical.  The right iliofemoral was sterilely prepped and draped, and a 6-French sheath was inserted using modified Seldinger technique after 1% Xylocaine local infiltration.  We then used a 6-French A2 multipurpose catheter to perform coronary angiography.  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  The left main coronary artery likely has an enter pulmonary artery and aortic root course before giving rise to the left anterior descending and left circumflex. The initial shot also identified total occlusion of the mid right coronary, which arose from this trunk with a superior and somewhat shepherd's crook type origin.  A Judkins right guide catheter would not provide significant support to allow PCI.  We then changed for a hockey- stick 6-French right coronary  guide catheter.  Angiomax bolus and infusion was started.  A CT documented to be greater than 300.  Effient 60 mg was then administered orally.  Versed and fentanyl, 1 and 50 mcg was given.  We then used a BMW wire to cross the stenosis in the midright coronary.  We dilated with a 2.5 x 12 mm long compliant balloon.  We reestablished TIMI grade 3 flow.  We then deployed tandem bare-metal stents in the mid right coronary.  An 18-mm long x 3.0 mm integrity stent was deployed distally, and a 12 mm x 3.0 mm integrity stent was applied more proximally.  The stents did not overlap. Postdilatation was then performed with a 3.25 x 12 mm long Waldo balloon to 14 atmospheres in two positions in the distal stent and within the more proximal stent to 14 atmospheres.  After stent deployment diminished flow, initially TIMI grade 1 was noted.  We then gave intracoronary nitroglycerin 400 mcg and then 200 mcg aliquots 1000 mcg of intracoronary verapamil.  This improved flow into the right coronary to an epicardial flow rate of at least 2.5 to 3 by TIMI score.  Minimal myocardial blush was noted, however.  The patient had persisting ST elevation and after initially having complete ST elevation resolution. After watching the patient in the laboratory for up to 15 minutes, the case  was terminated.  Left ventriculography was performed by hand injection with the 6-French A2 multipurpose catheter demonstrating relatively normal overall LV function, but akinesis of the inferior wall.  EF documented to be 50%, and there was severe LV end-diastolic pressure elevation.  The case was terminated and sheath pull with manual compression will be performed for hemostasis.  RESULTS: 1. Hemodynamic data:     a.     Aortic pressure 103/61.     b.     Left ventricular pressure 104/26. 2. Left ventriculography:  The basal to mid inferior wall is akinetic.     Overall, ejection fraction is 50%.  No mitral regurgitation is      noted. 3. Coronary angiography.     a.     Left main coronary:  The patient has a coronary anomaly with      both the left and right coronaries arising from a common trunk in      the right sinus of Valsalva.  The left main after arising from the      common trunk appears to course anterior to the aorta was likely      posterior to the pulmonary artery.  This would make the arterial      course enter great vessel, which is a potentially lethal coronary      patent.  No fixed obstruction is noted in the left main.     b.     Left anterior descending coronary:  The LAD and several      diagonal branches arise from the left main and are free of any      significant obstruction.  Irregularities are noted in the mid LAD.     c.     Circumflex artery:  Irregularities are noted, but no      significant obstruction is seen in the dominant obtuse marginal.     d.     Right coronary:  As mentioned earlier, the right coronary      also arises from the common right sinus of Valsalva trunk with a      somewhat shepherd's crook origin and is totally occluded in the      midvessel.  No collaterals to the distal right coronary were      noted. 4. Percutaneous coronary intervention.  The right coronary 100%     stenosis was reduced to less than 10% with tandem, but not     overlapping bare-metal stents in the mid right coronary.  Initial     reduced/slow flow of TIMI grade 1 was improved to TIMI grade 2.5-3     after intracoronary vasodilator therapy as outlined above.  Despite     improved epicardial flow, myocardial blush was minimal suggesting     significant microcirculatory obstruction.  CONCLUSION: 1. Acute inferior ST elevation myocardial infarction due to occlusion     of the mid right coronary. 2. Successful treatment of the mid right coronary with bare-metal     stenting. 3. 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  No evidence of compression     or obstruction was noted in the entire left coronary system. 4. Left ventricular systolic dysfunction with LVEF 45-50% and severe     diastolic dysfunction with end-diastolic pressure greater than 25%. 5. Solitary kidney of uncertain cause.  PLAN: 1. Monitor renal function. 2. IV nitroglycerin to facilitate microcirculatory recovery. 3. Effient will be switched to Plavix therapy. 4. Beta-blocker therapy and statin therapy will be started. 5. Stress doses of prednisone will be given for 48 hours, and then she     will go back to her maintenance dose. 6. Methotrexate will be given as scheduled. 7. Prognosis is guarded, but improved after successful PCI.     Lyn Records, M.D.     HWS/MEDQ  D:  03/29/2011  T:  03/30/2011  Job:  [id number]  cc:   Evelena Peat, M.D.  Electronically Signed by Verdis Prime M.D. on 04/15/2011 04:20:59 PM

## 2011-04-15 NOTE — Discharge Summary (Signed)
  [name], [name]                 ACCOUNT NO.:  [id number]  MEDICAL RECORD NO.:  [id number]  LOCATION:  3702                         FACILITY:  [facility]  PHYSICIAN:  Lyn Records, M.D.   DATE OF BIRTH:  [birth year]  DATE OF ADMISSION:  03/28/2011 DATE OF DISCHARGE:  03/31/2011                              DISCHARGE SUMMARY   REASON FOR ADMISSION TO THE HOSPITAL:  ST-elevation myocardial infarction.  DISCHARGE DIAGNOSES: 1. Acute inferior ST-elevation myocardial infarction with totally     occluded right coronary, treated with two bare-metal stents on     March 28, 2011. 2. Coronary anomaly with the patient noted to have a common (single)     coronary ostium arising from the right sinus of Valsalva and likely     left main course between the pulmonary artery and aorta. 3. Temporal arteritis. 4. Diabetes mellitus.     a.     A1c on admission was 7.2. 5. The patient is known to have a single functioning kidney with     normal creatinine levels.  PROCEDURES PERFORMED:  Emergency catheterization and percutaneous coronary intervention with right coronary stenting on March 28, 2011, by Dr. Lyn Records.  DISCHARGE PLANS: 1. The patient's condition on discharge was improved. 2. She is being evaluated by the physical therapy team and may require     outpatient physical therapy, although she is quite nimble for her     age. 3. Phase II cardiac rehab has been offered but the patient has not     decided. 4. Heart-healthy carbohydrate-modified diet.  MEDICATIONS: 1. Atorvastatin 20 mg daily. 2. Aspirin 325 mg daily. 3. Clopidogrel 75 mg daily. 4. Metoprolol succinate 25 mg daily. 5. Nitroglycerin 0.4 mg sublingually as needed if chest pain. 6. Calcium OTC 1 tablet per day. 7. Evista 60 mg daily. 8. Glimepiride 4 mg daily. 9. Methotrexate 2.5 mg tablets 2 tablets once per week on Tuesdays. 10.Prednisone 5 mg daily. 11.Vitamin E 400 International Units daily.  ACTIVITY:  As  tolerated per rehab/cardiac rehab protocol.  HISTORY AND PHYSICAL AND HOSPITAL COURSE:  The patient was admitted emergently and brought straight to the cath lab by the EMS.  She underwent emergency catheterization and PCI with stenting of the right coronary.  Please see the cath report and PCI report.  She had no reflow and had significant enzyme elevation.  She did not develop any evidence of heart failure.  She did have an accelerated idioventricular rhythm within 24 hours post reperfusion.  By the second hospital day, she was ambulating without difficulty.  The catheter site was unremarkable.  LDL cholesterol is 113 on admission.  Creatinine on the day of discharge was 0.68.  Blood pressure 130/75, heart rate 56.  Room air O2 saturation 94%.  The patient's overall hospital stay and management has been uneventful and without significant complications.  She is discharged home in improved condition.     Lyn Records, M.D.     HWS/MEDQ  D:  03/31/2011  T:  03/31/2011  Job:  [id number]  cc:   Evelena Peat, M.D.  Electronically Signed by Verdis Prime M.D. on 04/15/2011 04:20:34 PM

## 2011-04-15 NOTE — H&P (Signed)
  NAMEMISKI, FELDPAUSCH NO.:  [id number]  MEDICAL RECORD NO.:  [id number]  LOCATION:  2907                         FACILITY:  [facility]  PHYSICIAN:  Lyn Records, M.D.   DATE OF BIRTH:  01/21/1923  DATE OF ADMISSION:  03/28/2011 DATE OF DISCHARGE:                             HISTORY & PHYSICAL   SUBJECTIVE:  The patient is 73 and was brought emergently by EMS to [facility] after finding her with inferior ST elevation at around 10:20 p.m. on March 28, 2011.  They were summoned to the address because of severe back and chest discomfort that started at 10:00 p.m. The patient had intermittent discomfort for 48-72 hours prior to becoming severe and unrelenting.  She has also had some difficulty with left ear pain.  EKGs were compatible with acute inferior ST elevation MI.  SIGNIFICANT MEDICAL PROBLEMS: 1. Solitary kidney documented by imaging within the past 1-2 years. 2. Temporal arteritis. 3. History of kidney stones. 4. Goiter. 5. Left eye optic nerve injury. 6. Diabetes mellitus.  ALLERGIES:  SULFA and PENICILLIN.  SURGERIES:  None admitted.  HABITS:  Does not smoke tobacco or drink alcohol.  REVIEW OF SYSTEMS:  Denies history of heart disease.  Has had urinary tract infections.  No history of significant lung disease, although she was found to have lymphadenopathy on the CT scan and was evaluated by Dr. Delton Coombes of [facility] in the past.  REVIEW OF SYSTEMS:  Otherwise, unremarkable.  MEDICATIONS: 1. Glimepiride 2 mg daily. 2. Methotrexate 5 mg every Tuesday. 3. Prednisone 5 mg daily.  OBJECTIVE:  GENERAL:  The patient is uncomfortable.  She is lying on a gurney with 100% nonrebreathing mask. VITAL SIGNS:  Respiratory rate is 16, blood pressure 100/60, heart rate 60. SKIN:  Warm and dry. NECK:  No obvious JVD or carotid bruits. CHEST:  Clear. CARDIAC:  S4, no murmur, no rub. EXTREMITIES:  No edema.  Dorsalis pedis 2+ bilaterally.   Femorals 2+ bilaterally. NEURO:  Reveals an intact exam, although the patient is unable to give much history.  Chest x-ray not performed.  EKG x3 done by EMS with initial tracing at 10:24 p.m. demonstrating inferior ST elevation with reciprocal lateral wall changes.  ASSESSMENT: 1. Acute inferior ST elevation myocardial infarction with stuttering     symptoms over 48-72 hours with the patient having borderline     hemodynamics at this time. 2. Known single kidney of uncertain cause (congenital versus     atrophic). 3. Temporal arteritis.  PLAN: 1. Emergency catheterization. 2. Risks of the procedure including stroke, death, MI, emergency     surgery, limb ischemia, bleeding among others were discussed and     accepted by the patient.     Lyn Records, M.D.     HWS/MEDQ  D:  03/29/2011  T:  03/29/2011  Job:  [id number]  cc:   Evelena Peat, M.D.  Electronically Signed by Verdis Prime M.D. on 04/15/2011 04:20:43 PM

## 2011-04-21 ENCOUNTER — Ambulatory Visit (INDEPENDENT_AMBULATORY_CARE_PROVIDER_SITE_OTHER): Payer: Medicare Other | Admitting: Family Medicine

## 2011-04-21 ENCOUNTER — Encounter: Payer: Self-pay | Admitting: Family Medicine

## 2011-04-21 DIAGNOSIS — I2119 ST elevation (STEMI) myocardial infarction involving other coronary artery of inferior wall: Secondary | ICD-10-CM

## 2011-04-21 DIAGNOSIS — M316 Other giant cell arteritis: Secondary | ICD-10-CM

## 2011-04-21 DIAGNOSIS — I2510 Atherosclerotic heart disease of native coronary artery without angina pectoris: Secondary | ICD-10-CM | POA: Insufficient documentation

## 2011-04-21 HISTORY — DX: ST elevation (STEMI) myocardial infarction involving other coronary artery of inferior wall: I21.19

## 2011-04-21 LAB — LIPID PANEL
Cholesterol: 148 mg/dL (ref 0–200)
LDL Cholesterol: 54 mg/dL (ref 0–99)
Triglycerides: 101 mg/dL (ref 0.0–149.0)

## 2011-04-21 LAB — HEPATIC FUNCTION PANEL
AST: 30 U/L (ref 0–37)
Albumin: 3.7 g/dL (ref 3.5–5.2)
Alkaline Phosphatase: 56 U/L (ref 39–117)
Total Protein: 6.2 g/dL (ref 6.0–8.3)

## 2011-04-21 NOTE — Progress Notes (Signed)
  Subjective:    Patient ID: [name], female    DOB: March 19, 1923, 77 y.o.   MRN: [id number]  HPI Hospital followup. Patient admitted 13th of August with chest pain and acute inferior ST elevation MI. Placement of 2 stents right coronary artery. Patient had addition of new medications including Plavix 75 mg daily, Lipitor 20 mg daily, metoprolol 25 mg daily, and aspirin 325 mg daily. She's done well at this time. No further chest pains. No dyspnea. Tolerating Lipitor and other medications without side effect.  Temporal arteritis. Currently low-dose prednisone 5 mg daily and methotrexate once weekly. We are in process of tapering down. Diabetes stable. A1c on admission 7.2%. Takes glimepiride 2 mg daily. No symptoms of hyperglycemia or hypoglycemia.  Past Medical History  Diagnosis Date  . AODM 11/18/2008  . Carpal tunnel syndrome 08/04/2009  . FASCIITIS, PLANTAR 03/26/2010  . HYPERTENSION, BENIGN SYSTEMIC 10/12/2006  . INCONTINENCE, STRESS, FEMALE 10/12/2006  . Osteoporosis, unspecified 10/12/2006  . RHINITIS, ALLERGIC 10/12/2006  . TEMPORAL ARTERITIS 10/12/2006  . UNSTEADY GAIT 01/22/2007  . Kidney stone    No past surgical history on file.  reports that she has never smoked. She does not have any smokeless tobacco history on file. Her alcohol and drug histories not on file. family history is not on file. Allergies  Allergen Reactions  . Penicillins   . Sulfadiazine       Review of Systems  Constitutional: Negative for fever, appetite change and unexpected weight change.  Respiratory: Negative for cough and shortness of breath.   Cardiovascular: Negative for chest pain, palpitations and leg swelling.  Gastrointestinal: Negative for abdominal pain.  Genitourinary: Negative for dysuria.  Neurological: Negative for dizziness and headaches.       Objective:   Physical Exam  Constitutional: She appears well-developed and well-nourished. No distress.  HENT:  Mouth/Throat: Oropharynx  is clear and moist.  Cardiovascular: Normal rate, regular rhythm and normal heart sounds.   Pulmonary/Chest: Effort normal and breath sounds normal. No respiratory distress. She has no wheezes. She has no rales.  Musculoskeletal: She exhibits no edema.  Psychiatric: She has a normal mood and affect. Her behavior is normal.          Assessment & Plan:  #1 CAD with inferior MI recently as above. Stable. Continue cardiology followup #2 dyslipidemia. Check lipid and hepatic panel. Recent addition of atorvastatin and Plavix. #3 temporal arteritis. Recheck sedimentation rate. Continue tapering of prednisone and methotrexate. #4 type 2 diabetes. Stable. Continue low dose glimepiride

## 2011-04-22 NOTE — Progress Notes (Signed)
Quick Note:  Spoke with pt - informed of lab and that we will fax on manday to dr. Katrinka Blazing ______

## 2011-04-22 NOTE — Progress Notes (Signed)
Quick Note:  Spoke with pt - informed of lab and dr. burchette's instructions to take 1/2 tab of prednisone x30day then stop. kik ______

## 2011-04-26 ENCOUNTER — Ambulatory Visit (INDEPENDENT_AMBULATORY_CARE_PROVIDER_SITE_OTHER): Payer: Medicare Other | Admitting: Family Medicine

## 2011-04-26 ENCOUNTER — Encounter: Payer: Self-pay | Admitting: Family Medicine

## 2011-04-26 VITALS — BP 150/70 | Temp 97.7°F | Wt 149.0 lb

## 2011-04-26 DIAGNOSIS — R079 Chest pain, unspecified: Secondary | ICD-10-CM

## 2011-04-26 DIAGNOSIS — I2119 ST elevation (STEMI) myocardial infarction involving other coronary artery of inferior wall: Secondary | ICD-10-CM

## 2011-04-26 DIAGNOSIS — K219 Gastro-esophageal reflux disease without esophagitis: Secondary | ICD-10-CM

## 2011-04-26 NOTE — Progress Notes (Signed)
  Subjective:    Patient ID: [name], female    DOB: [birth year], 77 y.o.   MRN: [id number]  HPI Patient seen with atypical chest pain. Onset Saturday night. Symptoms consistently worse supine. She has frequent burping and notices some burning quality substernally. Worse after orange juice.  Improved slightly with TUMS. She's also had some intermittent hoarseness.  Recent history is that she had inferior MI last month. She presented with left ear pain then. She has not had any ear pain, neck pain, left upper extremity pain, or any chest pain heaviness at this time. She denies any dyspnea. No exertional quality. Current symptoms are different from when she had presented with her MI. She remains on several medications as listed. We have been reducing her dose of prednisone slowly for temporal arteritis.  Past Medical History  Diagnosis Date  . AODM 11/18/2008  . Carpal tunnel syndrome 08/04/2009  . FASCIITIS, PLANTAR 03/26/2010  . HYPERTENSION, BENIGN SYSTEMIC 10/12/2006  . INCONTINENCE, STRESS, FEMALE 10/12/2006  . Osteoporosis, unspecified 10/12/2006  . RHINITIS, ALLERGIC 10/12/2006  . TEMPORAL ARTERITIS 10/12/2006  . UNSTEADY GAIT 01/22/2007  . Kidney stone    No past surgical history on file.  reports that she has never smoked. She does not have any smokeless tobacco history on file. Her alcohol and drug histories not on file. family history is not on file. Allergies  Allergen Reactions  . Penicillins   . Sulfadiazine       Review of Systems  Constitutional: Negative for fever and chills.  HENT: Positive for voice change. Negative for trouble swallowing and tinnitus.   Respiratory: Negative for cough, shortness of breath and wheezing.   Cardiovascular: Negative for palpitations and leg swelling.  Gastrointestinal: Negative for nausea, vomiting and abdominal pain.       Objective:   Physical Exam  Constitutional: She appears well-developed and well-nourished. No distress.    HENT:  Mouth/Throat: Oropharynx is clear and moist.  Neck: Neck supple.  Cardiovascular: Normal rate and regular rhythm.   Pulmonary/Chest: Effort normal and breath sounds normal. No respiratory distress. She has no wheezes. She has no rales.  Abdominal: Soft. Bowel sounds are normal. She exhibits no distension and no mass. There is no tenderness. There is no rebound and no guarding.  Musculoskeletal: She exhibits no edema.  Lymphadenopathy:    She has no cervical adenopathy.  Psychiatric: She has a normal mood and affect. Her behavior is normal. Thought content normal.          Assessment & Plan:  Atypical chest pain. She has some reflux-type symptoms which are worse supine and exacerbated by things like orange juice.  Current symptoms are nonexertional and improved with Tums.  EKG today shows inverted T waves in inferior leads but no acute ST segment changes. As above, she had recent inferior MI. Samples Nexium 40 mg one daily and dietary modification. Elevate head of bed 6-8 inches. Followup immediately if she has any shortness of breath, chest tightness or a new symptoms

## 2011-04-26 NOTE — Progress Notes (Signed)
Quick Note:  Labs faxed to Garnette Scheuermann MD ______

## 2011-04-26 NOTE — Patient Instructions (Signed)
Diet for GERD or PUD Nutrition therapy can help ease the discomfort of gastroesophageal reflux disease (GERD) and peptic ulcer disease (PUD).  HOME CARE INSTRUCTIONS  Eat your meals slowly, in a relaxed setting.   Eat 5 to 6 small meals per day.   If a food causes distress, stop eating it for a period of time.  FOODS TO AVOID:  Coffee, regular or decaffeinated.  Cola beverages, regular or low calorie.   Tea, regular or decaffeinated.   Pepper.   Cocoa.   High fat foods including meats.   Butter, margarine, hydrogenated oil (trans fats).  Peppermint or spearmint (if you have GERD).   Fruits and vegetables as tolerated.   Alcoholic beverages.   Nicotine (smoking or chewing). This is one of the most potent stimulants to acid production in the gastrointestinal tract.   Any food that seems to aggravate your condition.   If you have questions regarding your diet, call your caregiver's office or a registered dietitian. OTHER TIPS IF YOU HAVE GERD:  Lying flat may make symptoms worse. Keep the head of your bed raised 6 to 9 inches by using a foam wedge or blocks under the legs of the bed.   Do not lay down until 3 hours after eating a meal.   Daily physical activity may help reduce symptoms.  MAKE SURE YOU:   Understand these instructions.   Will watch your condition.   Will get help right away if you are not doing well or get worse.  Document Released: 08/01/2005 Document Re-Released: 12/18/2008 [facility] Patient Information 2011 [place], [place].  Elevate head of bed 6-8 inches Nexium 40 mg one daily.  When samples run out consider over the counter Prilosec one daily for the next month.

## 2011-04-27 ENCOUNTER — Encounter: Payer: Self-pay | Admitting: Family Medicine

## 2011-05-10 ENCOUNTER — Encounter: Payer: Self-pay | Admitting: Family Medicine

## 2011-05-10 ENCOUNTER — Ambulatory Visit (INDEPENDENT_AMBULATORY_CARE_PROVIDER_SITE_OTHER): Payer: Medicare Other | Admitting: Family Medicine

## 2011-05-10 DIAGNOSIS — K5900 Constipation, unspecified: Secondary | ICD-10-CM

## 2011-05-10 DIAGNOSIS — E119 Type 2 diabetes mellitus without complications: Secondary | ICD-10-CM

## 2011-05-10 DIAGNOSIS — I2510 Atherosclerotic heart disease of native coronary artery without angina pectoris: Secondary | ICD-10-CM

## 2011-05-10 DIAGNOSIS — M316 Other giant cell arteritis: Secondary | ICD-10-CM

## 2011-05-10 DIAGNOSIS — I10 Essential (primary) hypertension: Secondary | ICD-10-CM

## 2011-05-10 NOTE — Patient Instructions (Signed)

## 2011-05-10 NOTE — Progress Notes (Signed)
  Subjective:    Patient ID: [name], female    DOB: 10-28-1922, 77 y.o.   MRN: [id number]  HPI Medical followup. Recent MI. Presented last visit atypical chest pain. Felt to be reflux related. Symptoms resolved with Nexium. She is currently off Nexium. She is having problems with constipation. Last bowel movement 2 days ago. Significant straining. No bloody stools. No nausea or vomiting. Recent change in medication with addition of metoprolol, Plavix, and Lipitor. No anticholinergic or pain medications.  Milk of Magnesia with minimal relief.  Temporal arteritis history. Symptomatically stable. Recent sedimentation rate 10. Tapering off prednisone currently 2.5 mg daily. Will be off altogether in 1 week. Still taking methotrexate.  Type 2 diabetes. Probably exacerbated by recent years of prednisone. Blood sugars have been stable. Still takes low dose glimepiride. No symptoms of hypoglycemia  Past Medical History  Diagnosis Date  . AODM 11/18/2008  . Carpal tunnel syndrome 08/04/2009  . FASCIITIS, PLANTAR 03/26/2010  . HYPERTENSION, BENIGN SYSTEMIC 10/12/2006  . INCONTINENCE, STRESS, FEMALE 10/12/2006  . Osteoporosis, unspecified 10/12/2006  . RHINITIS, ALLERGIC 10/12/2006  . TEMPORAL ARTERITIS 10/12/2006  . UNSTEADY GAIT 01/22/2007  . Kidney stone   . Inferior MI 8/12   No past surgical history on file.  reports that she has never smoked. She does not have any smokeless tobacco history on file. Her alcohol and drug histories not on file. family history is not on file. Allergies  Allergen Reactions  . Penicillins   . Sulfadiazine       Review of Systems  Constitutional: Negative for fever, chills and activity change.  Eyes: Negative for visual disturbance.  Respiratory: Negative for cough and shortness of breath.   Cardiovascular: Negative for chest pain, palpitations and leg swelling.  Genitourinary: Negative for dysuria.  Neurological: Negative for dizziness, syncope and  headaches.  Psychiatric/Behavioral: Negative for confusion.       Objective:   Physical Exam  Constitutional: She is oriented to person, place, and time. She appears well-developed and well-nourished.  Neck: Neck supple.  Cardiovascular: Normal rate and regular rhythm.  Exam reveals no gallop.   No murmur heard. Pulmonary/Chest: Effort normal and breath sounds normal. No respiratory distress. She has no wheezes. She has no rales.  Abdominal: Soft. She exhibits no mass. There is no tenderness.  Genitourinary:       No impaction. She has some pasty stool not well formed rectum  Musculoskeletal: She exhibits no edema.  Lymphadenopathy:    She has no cervical adenopathy.  Neurological: She is alert and oriented to person, place, and time.  Psychiatric: She has a normal mood and affect. Her behavior is normal.          Assessment & Plan:  #1 constipation. Try Senokot and increase fluid intake. Also needs to increase fiber intake with suggestions given. Handout on constipation given.  #2 history of CAD. Recent labs reviewed with patient. No further chest pains. #3 type 2 diabetes. Reassess A1c a followup #4 temporal arteritis history. Sed rate stable. Tapering prednisone and off in one week.

## 2011-05-30 ENCOUNTER — Telehealth: Payer: Self-pay | Admitting: *Deleted

## 2011-05-30 NOTE — Telephone Encounter (Signed)
Opened in error

## 2011-06-09 ENCOUNTER — Ambulatory Visit (INDEPENDENT_AMBULATORY_CARE_PROVIDER_SITE_OTHER): Payer: Medicare Other | Admitting: Family Medicine

## 2011-06-09 ENCOUNTER — Encounter: Payer: Self-pay | Admitting: Family Medicine

## 2011-06-09 VITALS — BP 130/62 | Temp 98.6°F | Wt 148.0 lb

## 2011-06-09 DIAGNOSIS — Z23 Encounter for immunization: Secondary | ICD-10-CM

## 2011-06-09 DIAGNOSIS — I2510 Atherosclerotic heart disease of native coronary artery without angina pectoris: Secondary | ICD-10-CM

## 2011-06-09 DIAGNOSIS — M316 Other giant cell arteritis: Secondary | ICD-10-CM

## 2011-06-09 DIAGNOSIS — E119 Type 2 diabetes mellitus without complications: Secondary | ICD-10-CM

## 2011-06-09 MED ORDER — ATORVASTATIN CALCIUM 20 MG PO TABS: 20.0000 mg | ORAL_TABLET | Freq: Every day | ORAL | Status: DC

## 2011-06-09 NOTE — Progress Notes (Signed)
  Subjective:    Patient ID: [name], female    DOB: 20-Nov-1922, 77 y.o.   MRN: [id number]  HPI  Medical followup. CAD history. Recently ran out of Lipitor for some reason did not refill. Recent lipids were at goal and improved. No recent chest pains. Remains on Plavix and metoprolol.  History of temporal arteritis. Recently weaned off prednisone. Still taking low-dose methotrexate. No recurrence of any headaches or visual symptoms. Has occasional myalgias in her extremities.  Type 2 diabetes exacerbated by prednisone. Still takes Amaryl. Will consider tapering off if A1c is stable. Rarely checking blood sugars usually low 100s. Patient needs flu vaccine.  Review of Systems  Constitutional: Positive for fatigue. Negative for fever and chills.  Eyes: Negative for visual disturbance.  Respiratory: Negative for cough and shortness of breath.   Cardiovascular: Negative for chest pain, palpitations and leg swelling.  Genitourinary: Negative for dysuria.  Neurological: Negative for dizziness and headaches.  Psychiatric/Behavioral: Negative for confusion and dysphoric mood.       Objective:   Physical Exam  Constitutional: She is oriented to person, place, and time. She appears well-developed and well-nourished.  Neck: Neck supple. No thyromegaly present.  Cardiovascular: Normal rate, regular rhythm and normal heart sounds.   Pulmonary/Chest: Effort normal and breath sounds normal. No respiratory distress. She has no wheezes. She has no rales.  Lymphadenopathy:    She has no cervical adenopathy.  Neurological: She is alert and oriented to person, place, and time. No cranial nerve deficit.  Psychiatric: She has a normal mood and affect. Her behavior is normal.          Assessment & Plan:  #1 history of temporal arteritis. Weaned off prednisone. Check sedimentation rate. If still stable taper off methotrexate #2 CAD history. Refilled Lipitor and discussed importance of long-term  use of statin medication #3 type 2 diabetes. Recheck A1c. If stable further tapering of glimepiride.  Hopefully improved off prednisone.

## 2011-06-10 ENCOUNTER — Other Ambulatory Visit: Payer: Self-pay | Admitting: Family Medicine

## 2011-06-10 DIAGNOSIS — E119 Type 2 diabetes mellitus without complications: Secondary | ICD-10-CM

## 2011-06-10 MED ORDER — GLIMEPIRIDE 1 MG PO TABS: 1.0000 mg | ORAL_TABLET | Freq: Two times a day (BID) | ORAL | Status: DC

## 2011-06-10 NOTE — Progress Notes (Signed)
Quick Note:  Pt informed ______ 

## 2011-06-20 ENCOUNTER — Telehealth: Payer: Self-pay | Admitting: Family Medicine

## 2011-06-20 NOTE — Telephone Encounter (Signed)
appt scheduled

## 2011-06-20 NOTE — Telephone Encounter (Signed)
Pt called today and the bp medicine is making her sick . No chest pains. Please advise. Wants a call back. Thanks.

## 2011-06-22 ENCOUNTER — Encounter: Payer: Self-pay | Admitting: Family Medicine

## 2011-06-22 ENCOUNTER — Ambulatory Visit (INDEPENDENT_AMBULATORY_CARE_PROVIDER_SITE_OTHER): Payer: Medicare Other | Admitting: Family Medicine

## 2011-06-22 VITALS — BP 140/72 | Temp 97.7°F | Wt 148.0 lb

## 2011-06-22 DIAGNOSIS — G4700 Insomnia, unspecified: Secondary | ICD-10-CM

## 2011-06-22 DIAGNOSIS — J309 Allergic rhinitis, unspecified: Secondary | ICD-10-CM

## 2011-06-22 DIAGNOSIS — I2510 Atherosclerotic heart disease of native coronary artery without angina pectoris: Secondary | ICD-10-CM

## 2011-06-22 MED ORDER — AZELASTINE HCL 0.1 % NA SOLN: 2.0000 | Freq: Two times a day (BID) | NASAL | Status: DC

## 2011-06-22 NOTE — Progress Notes (Signed)
  Subjective:    Patient ID: [name], female    DOB: Jan 10, 1923, 77 y.o.   MRN: [id number]  HPI  Patient seen for several following issues. Her chronic problems include history of type 2 diabetes, hypertension, temporal arteritis, allergic rhinitis, osteoporosis, and CAD. Denies any recent chest pain. Compliant with Plavix and Lipitor. She remains on Toprol XL 25 mg daily.  Complains of some insomnia issues. Frequently waking around 2 to 3 in the morning but is apparently sleeping much of the day. No significant caffeine use. No alcohol use. Denies depression. No decongestant use.  Frequent nasal congestion symptoms. Frequently has dry mouth. Blood sugar well controlled. No polyuria. Nasal congestion worse at night. Frequent sneezing. Has taken Flonase without much improvement. Denies recent fever or chills  Past Medical History  Diagnosis Date  . AODM 11/18/2008  . Carpal tunnel syndrome 08/04/2009  . FASCIITIS, PLANTAR 03/26/2010  . HYPERTENSION, BENIGN SYSTEMIC 10/12/2006  . INCONTINENCE, STRESS, FEMALE 10/12/2006  . Osteoporosis, unspecified 10/12/2006  . RHINITIS, ALLERGIC 10/12/2006  . TEMPORAL ARTERITIS 10/12/2006  . UNSTEADY GAIT 01/22/2007  . Kidney stone   . Inferior MI 8/12   No past surgical history on file.  reports that she has never smoked. She does not have any smokeless tobacco history on file. Her alcohol and drug histories not on file. family history is not on file. Allergies  Allergen Reactions  . Penicillins   . Sulfadiazine      Review of Systems  Constitutional: Negative for fever and chills.  HENT: Positive for congestion, postnasal drip and sinus pressure.   Respiratory: Negative for cough and shortness of breath.   Cardiovascular: Negative for chest pain.  Neurological: Negative for dizziness and headaches.  Psychiatric/Behavioral: Negative for dysphoric mood.       Objective:   Physical Exam  Constitutional: She is oriented to person, place, and  time. She appears well-developed and well-nourished.  HENT:  Mouth/Throat: Oropharynx is clear and moist.       Moderate swelling nasal mucosa bilaterally  Neck: Neck supple. No thyromegaly present.  Cardiovascular: Normal rate and regular rhythm.   Pulmonary/Chest: Effort normal and breath sounds normal. No respiratory distress. She has no wheezes. She has no rales.  Neurological: She is alert and oriented to person, place, and time. No cranial nerve deficit.  Psychiatric: She has a normal mood and affect. Her behavior is normal.          Assessment & Plan:  #1 chronic insomnia. Sleep hygiene discussed. Educational sheet given. Avoid daytime sleeping. She will consider over-the-counter melatonin. Trial of void chronic sedative hypnotics #2 frequent rhinitis. Trial of Astelin nasal 1 spray per nostril twice daily. #3 history of CAD. Stressed importance of compliance with medications including Plavix, Lipitor, and metoprolol.

## 2011-06-22 NOTE — Patient Instructions (Addendum)
Insomnia Insomnia is frequent trouble falling and/or staying asleep. Insomnia can be a long term problem or a short term problem. Both are common. Insomnia can be a short term problem when the wakefulness is related to a certain stress or worry. Long term insomnia is often related to ongoing stress during waking hours and/or poor sleeping habits. Overtime, sleep deprivation itself can make the problem worse. Every little thing feels more severe because you are overtired and your ability to cope is decreased. CAUSES   Stress, anxiety, and depression.   Poor sleeping habits.   Distractions such as TV in the bedroom.   Naps close to bedtime.   Engaging in emotionally charged conversations before bed.   Technical reading before sleep.   Alcohol and other sedatives. They may make the problem worse. They can hurt normal sleep patterns and normal dream activity.   Stimulants such as caffeine for several hours prior to bedtime.   Pain syndromes and shortness of breath can cause insomnia.   Exercise late at night.   Changing time zones may cause sleeping problems (jet lag).  It is sometimes helpful to have someone observe your sleeping patterns. They should look for periods of not breathing during the night (sleep apnea). They should also look to see how long those periods last. If you live alone or observers are uncertain, you can also be observed at a sleep clinic where your sleep patterns will be professionally monitored. Sleep apnea requires a checkup and treatment. Give your caregivers your medical history. Give your caregivers observations your family has made about your sleep.  SYMPTOMS   Not feeling rested in the morning.   Anxiety and restlessness at bedtime.   Difficulty falling and staying asleep.  TREATMENT   Your caregiver may prescribe treatment for an underlying medical disorders. Your caregiver can give advice or help if you are using alcohol or other drugs for  self-medication. Treatment of underlying problems will usually eliminate insomnia problems.   Medications can be prescribed for short time use. They are generally not recommended for lengthy use.   Over-the-counter sleep medicines are not recommended for lengthy use. They can be habit forming.   You can promote easier sleeping by making lifestyle changes such as:   Using relaxation techniques that help with breathing and reduce muscle tension.   Exercising earlier in the day.   Changing your diet and the time of your last meal. No night time snacks.   Establish a regular time to go to bed.   Counseling can help with stressful problems and worry.   Soothing music and white noise may be helpful if there are background noises you cannot remove.   Stop tedious detailed work at least one hour before bedtime.  HOME CARE INSTRUCTIONS   Keep a diary. Inform your caregiver about your progress. This includes any medication side effects. See your caregiver regularly. Take note of:   Times when you are asleep.   Times when you are awake during the night.   The quality of your sleep.   How you feel the next day.  This information will help your caregiver care for you.  Get out of bed if you are still awake after 15 minutes. Read or do some quiet activity. Keep the lights down. Wait until you feel sleepy and go back to bed.   Keep regular sleeping and waking hours. Avoid naps.   Exercise regularly.   Avoid distractions at bedtime. Distractions include watching television or engaging   in any intense or detailed activity like attempting to balance the household checkbook.   Develop a bedtime ritual. Keep a familiar routine of bathing, brushing your teeth, climbing into bed at the same time each night, listening to soothing music. Routines increase the success of falling to sleep faster.   Use relaxation techniques. This can be using breathing and muscle tension release routines. It can  also include visualizing peaceful scenes. You can also help control troubling or intruding thoughts by keeping your mind occupied with boring or repetitive thoughts like the old concept of counting sheep. You can make it more creative like imagining planting one beautiful flower after another in your backyard garden.   During your day, work to eliminate stress. When this is not possible use some of the previous suggestions to help reduce the anxiety that accompanies stressful situations.  MAKE SURE YOU:   Understand these instructions.   Will watch your condition.   Will get help right away if you are not doing well or get worse.  Document Released: 07/29/2000 Document Revised: 04/13/2011 Document Reviewed: 08/29/2007 [facility] Patient Information 2012 [place], [place].  Consider reducing aspirin to 81mg  Consider humidifier. Consider melatonin.

## 2011-07-02 ENCOUNTER — Emergency Department (HOSPITAL_COMMUNITY): Payer: Medicare Other

## 2011-07-02 ENCOUNTER — Emergency Department (HOSPITAL_COMMUNITY)
Admission: EM | Admit: 2011-07-02 | Discharge: 2011-07-02 | Disposition: A | Discharge status: Home / Self Care | Payer: Medicare Other | Attending: Emergency Medicine | Admitting: Emergency Medicine

## 2011-07-02 DIAGNOSIS — K5900 Constipation, unspecified: Secondary | ICD-10-CM | POA: Insufficient documentation

## 2011-07-02 DIAGNOSIS — R109 Unspecified abdominal pain: Secondary | ICD-10-CM | POA: Insufficient documentation

## 2011-07-02 LAB — POCT I-STAT, CHEM 8
HCT: 32 % — ABNORMAL LOW (ref 36.0–46.0)
Hemoglobin: 10.9 g/dL — ABNORMAL LOW (ref 12.0–15.0)
Potassium: 4 mEq/L (ref 3.5–5.1)
Sodium: 138 mEq/L (ref 135–145)

## 2011-07-02 MED ORDER — FLEET ENEMA 7-19 GM/118ML RE ENEM
1.0000 | ENEMA | Freq: Once | RECTAL | Status: AC
  Administered 2011-07-02: 1 via RECTAL
  Filled 2011-07-02: qty 1

## 2011-07-02 NOTE — ED Notes (Signed)
Report given to Erin RN

## 2011-07-02 NOTE — ED Notes (Signed)
Pt sitting on toilet now attempting to have a BM s/p enema.

## 2011-07-02 NOTE — ED Provider Notes (Signed)
This 77 year old female has chronic constipation with her last bowel movement over week ago, she has some intermittent abdominal cramps but no constant pain and no pain today, she is passing flatus, she has no nausea or vomiting and no chest pain or shortness of breath, this is a recurrent constipation problem for her with no relief with over-the-counter medicines, she has chronic urinary incontinence and it is unchanged without dysuria today, her abdomen is soft and nontender.  Medical screening examination/treatment/procedure(s) were conducted as a shared visit with non-physician practitioner(s) and myself.  I personally evaluated the patient during the encounter  Hurman Horn, MD 07/02/11 1215

## 2011-07-02 NOTE — ED Notes (Signed)
Pt states she has not had a bowel movement in over a week.  Pt has taken medication at home to try and help but has had no relief.

## 2011-07-04 ENCOUNTER — Telehealth: Payer: Self-pay

## 2011-07-04 NOTE — Telephone Encounter (Signed)
[facility] Triage Call Report Triage Record Num: [id number] Operator: Durward Mallard [facility] Patient Name: [name] Call Date & Time: 07/02/2011 9:57:53AM Patient Phone: [phone] PCP: Evelena Peat Patient Gender: Female PCP Fax : [phone] Patient DOB: [birth year] Practice Name: [facility] Reason for Call: Caller: Misty Stanley Flemming/Other; PCP: Caryl Never (Burr-shet), Elberta Fortis.; CB#: [phone]; Call Reason: Constipation; Sx Onset: 06/25/2011; Sx Notes: Family friend is calling and states that she ws seen last week d/t constipation; her last BM was last week at the office after he "did something" but unsure what it is; last BM 06/23/11; she is also having soreness to left shoulder and arm and may have over exerted while doing things in the yard; having constant abdominal pain for 3-4 hours; Afebrile; Wt:148 lb; Home treatment(s) tried: OTC stool softerners and laxatives; Did home treatment help?: No; Guideline Used: Abdominal Pain; Disp: See in ED immediately d/t abdominal pain that radiates to shoulder and down to finger tips; [place]; Appt Scheduled?: No Protocol(s) Used: Abdominal Pain Protocol(s) Used: Constipation Recommended Outcome per Protocol: See ED Immediately Reason for Outcome: Constant abdominal pain or abdominal pain present for more than 3-4 hours Sudden onset or recurring abdominal pain that radiates to upper back or shoulder AND any of the following: loss of appetite, vomiting starting after pain, any fever, OR unable to carry out normal activities Care Advice: ~ Another adult should drive. ~ Pain medication or laxatives should not be taken until symptoms are evaluated. ~ Do not eat or drink anything until evaluated by provider. ~ IMMEDIATE ACTION Write down provider's name. List or place the following in a bag for transport with the patient: current prescription and/or nonprescription medications; alternative treatments, therapies and medications;  and street drugs. ~ 11

## 2011-07-13 ENCOUNTER — Telehealth: Payer: Self-pay | Admitting: Family Medicine

## 2011-07-13 NOTE — Telephone Encounter (Signed)
Follow up to discuss.  We had tapered her off.

## 2011-07-13 NOTE — Telephone Encounter (Signed)
Spoke with patient.

## 2011-07-13 NOTE — Telephone Encounter (Signed)
Wants new rx for Prednisone for her Arthritis. Please advise patient. Please send to [facility]. Or she is requesting to see Dr Caryl Never to discuss this matter.

## 2011-07-13 NOTE — Telephone Encounter (Signed)
At her age and other medical problems Tylenol is safest option

## 2011-07-13 NOTE — Telephone Encounter (Signed)
Appointment made.  patient  Would like to know what she can try OTC until Friday?

## 2011-07-15 ENCOUNTER — Ambulatory Visit (INDEPENDENT_AMBULATORY_CARE_PROVIDER_SITE_OTHER): Payer: Medicare Other | Admitting: Family Medicine

## 2011-07-15 ENCOUNTER — Encounter: Payer: Self-pay | Admitting: Family Medicine

## 2011-07-15 VITALS — BP 150/74 | Temp 97.8°F | Wt 145.0 lb

## 2011-07-15 DIAGNOSIS — R5383 Other fatigue: Secondary | ICD-10-CM

## 2011-07-15 DIAGNOSIS — IMO0001 Reserved for inherently not codable concepts without codable children: Secondary | ICD-10-CM

## 2011-07-15 DIAGNOSIS — M316 Other giant cell arteritis: Secondary | ICD-10-CM

## 2011-07-15 DIAGNOSIS — R6889 Other general symptoms and signs: Secondary | ICD-10-CM

## 2011-07-15 DIAGNOSIS — M791 Myalgia, unspecified site: Secondary | ICD-10-CM

## 2011-07-15 DIAGNOSIS — E119 Type 2 diabetes mellitus without complications: Secondary | ICD-10-CM

## 2011-07-15 LAB — SEDIMENTATION RATE: Sed Rate: 109 mm/hr — ABNORMAL HIGH (ref 0–22)

## 2011-07-15 NOTE — Progress Notes (Signed)
  Subjective:    Patient ID: [name], female    DOB: 04/23/1923, 77 y.o.   MRN: [id number]  HPI  Patient seen with body aches, increased fatigue, myalgias, cold intolerance progressive over the past few weeks. Her past history is for temporal arteritis. Treated for several years on methotrexate and prednisone. We gradually weaned her prednisone but she remains on methotrexate. She had sed rate back in October around 67 which had increased somewhat but symptomatically she was stable at that time. Her symptoms have progressed since then. We could not find any recent thyroid functions.  Good appetite.  No weight changes. Type 2 diabetes stable. No recent chest pains. Occasional diffuse headaches  Past Medical History  Diagnosis Date  . AODM 11/18/2008  . Carpal tunnel syndrome 08/04/2009  . FASCIITIS, PLANTAR 03/26/2010  . HYPERTENSION, BENIGN SYSTEMIC 10/12/2006  . INCONTINENCE, STRESS, FEMALE 10/12/2006  . Osteoporosis, unspecified 10/12/2006  . RHINITIS, ALLERGIC 10/12/2006  . TEMPORAL ARTERITIS 10/12/2006  . UNSTEADY GAIT 01/22/2007  . Kidney stone   . Inferior MI 8/12   No past surgical history on file.  reports that she has never smoked. She does not have any smokeless tobacco history on file. Her alcohol and drug histories not on file. family history is not on file. Allergies  Allergen Reactions  . Penicillins Hives  . Sulfadiazine Hives      Review of Systems  Constitutional: Positive for fatigue. Negative for fever, chills, appetite change and unexpected weight change.  Eyes: Positive for visual disturbance (chronic vision loss L eye). Negative for photophobia and pain.  Respiratory: Negative for cough and shortness of breath.   Cardiovascular: Negative for chest pain.  Musculoskeletal: Positive for myalgias and arthralgias. Negative for back pain and joint swelling.  Skin: Negative for rash.  Neurological: Positive for weakness and headaches. Negative for dizziness and  syncope.  Psychiatric/Behavioral: Negative for dysphoric mood.       Objective:   Physical Exam  Constitutional: She is oriented to person, place, and time. She appears well-developed and well-nourished.  HENT:  Mouth/Throat: Oropharynx is clear and moist.  Neck: Neck supple.  Cardiovascular: Normal rate and regular rhythm.   Pulmonary/Chest: Effort normal and breath sounds normal. No respiratory distress. She has no wheezes. She has no rales.  Musculoskeletal: She exhibits no edema.  Lymphadenopathy:    She has no cervical adenopathy.  Neurological: She is alert and oriented to person, place, and time. No cranial nerve deficit.          Assessment & Plan:  Patient presents with multiple symptoms including cold intolerance, fatigue, and diffuse body aches. Rule out hypothyroidism. Check TSH. Also repeat sedimentation rate. If TSH normal and sedimentation rate back up consider getting back on prednisone.

## 2011-07-18 ENCOUNTER — Other Ambulatory Visit: Payer: Self-pay | Admitting: Family Medicine

## 2011-07-18 MED ORDER — PREDNISONE 10 MG PO TABS: 10.0000 mg | ORAL_TABLET | Freq: Every day | ORAL | Status: DC

## 2011-07-18 NOTE — Progress Notes (Signed)
Quick Note:  Pt informed, she will call back for OV in 2 weeks. Meds will be ordered ______

## 2011-07-21 ENCOUNTER — Ambulatory Visit: Payer: Medicare Other | Admitting: Family Medicine

## 2011-08-01 ENCOUNTER — Encounter: Payer: Self-pay | Admitting: Family Medicine

## 2011-08-01 ENCOUNTER — Ambulatory Visit (INDEPENDENT_AMBULATORY_CARE_PROVIDER_SITE_OTHER): Payer: Medicare Other | Admitting: Family Medicine

## 2011-08-01 DIAGNOSIS — M353 Polymyalgia rheumatica: Secondary | ICD-10-CM

## 2011-08-01 DIAGNOSIS — M316 Other giant cell arteritis: Secondary | ICD-10-CM

## 2011-08-01 DIAGNOSIS — E119 Type 2 diabetes mellitus without complications: Secondary | ICD-10-CM

## 2011-08-01 LAB — SEDIMENTATION RATE: Sed Rate: 37 mm/hr — ABNORMAL HIGH (ref 0–22)

## 2011-08-01 MED ORDER — PREDNISONE 10 MG PO TABS: 10.0000 mg | ORAL_TABLET | Freq: Every day | ORAL | Status: DC

## 2011-08-01 NOTE — Progress Notes (Signed)
  Subjective:    Patient ID: [name], female    DOB: 10/01/1922, 77 y.o.   MRN: [id number]  HPI  Medical follow up. History of temporal arteritis. We tried tapering her off prednisone but she had resurgence of polymyalgias. These were quite severe. Repeat sedimentation rate was 109. After starting back prednisone 20 mg daily she had almost full resolution and is currently controlled on 10 mg. No recent change in visual symptoms. No headaches. No fevers or chills. Blood sugars not being monitored but no symptoms of hyperglycemia.  Past Medical History  Diagnosis Date  . AODM 11/18/2008  . Carpal tunnel syndrome 08/04/2009  . FASCIITIS, PLANTAR 03/26/2010  . HYPERTENSION, BENIGN SYSTEMIC 10/12/2006  . INCONTINENCE, STRESS, FEMALE 10/12/2006  . Osteoporosis, unspecified 10/12/2006  . RHINITIS, ALLERGIC 10/12/2006  . TEMPORAL ARTERITIS 10/12/2006  . UNSTEADY GAIT 01/22/2007  . Kidney stone   . Inferior MI 8/12   No past surgical history on file.  reports that she has never smoked. She does not have any smokeless tobacco history on file. Her alcohol and drug histories not on file. family history is not on file. Allergies  Allergen Reactions  . Penicillins Hives  . Sulfadiazine Hives      Review of Systems  Constitutional: Negative for appetite change and unexpected weight change.  Eyes: Negative for visual disturbance.  Respiratory: Negative for cough and shortness of breath.   Cardiovascular: Negative for chest pain.  Genitourinary: Negative for dysuria.  Musculoskeletal: Negative for myalgias, back pain and arthralgias.  Skin: Negative for rash.  Neurological: Negative for dizziness and headaches.  Psychiatric/Behavioral: Negative for dysphoric mood.       Objective:   Physical Exam  Constitutional: She appears well-developed and well-nourished.  Neck: Neck supple.  Cardiovascular: Normal rate and regular rhythm.   Pulmonary/Chest: Effort normal and breath sounds normal. No  respiratory distress. She has no wheezes. She has no rales.  Musculoskeletal: She exhibits no edema.  Lymphadenopathy:    She has no cervical adenopathy.          Assessment & Plan:  #1 polymyalgias. Greatly improved and basically resolved on prednisone. Recheck sedimentation rate today. She has not been tolerant of tapering in the past and has been on low-dose prednisone for about 5 years now. #2 type 2 diabetes well controlled. Recheck A1c at followup

## 2011-08-02 ENCOUNTER — Other Ambulatory Visit: Payer: Self-pay | Admitting: Family Medicine

## 2011-08-02 MED ORDER — PREDNISONE 10 MG PO TABS: 10.0000 mg | ORAL_TABLET | Freq: Every day | ORAL | Status: DC

## 2011-08-02 NOTE — Progress Notes (Signed)
Quick Note:  Pt informed, will fill prednisone ______

## 2011-10-10 ENCOUNTER — Ambulatory Visit: Payer: Medicare Other | Admitting: Family Medicine

## 2011-10-10 ENCOUNTER — Ambulatory Visit (INDEPENDENT_AMBULATORY_CARE_PROVIDER_SITE_OTHER): Payer: Medicare Other | Admitting: Family Medicine

## 2011-10-10 ENCOUNTER — Encounter: Payer: Self-pay | Admitting: Family Medicine

## 2011-10-10 DIAGNOSIS — M316 Other giant cell arteritis: Secondary | ICD-10-CM

## 2011-10-10 DIAGNOSIS — M353 Polymyalgia rheumatica: Secondary | ICD-10-CM | POA: Diagnosis not present

## 2011-10-10 DIAGNOSIS — I10 Essential (primary) hypertension: Secondary | ICD-10-CM | POA: Diagnosis not present

## 2011-10-10 DIAGNOSIS — E119 Type 2 diabetes mellitus without complications: Secondary | ICD-10-CM

## 2011-10-10 NOTE — Progress Notes (Signed)
  Subjective:    Patient ID: [name], female    DOB: 01-15-1923, 78 y.o.   MRN: [id number]  HPI  Medical followup. She has type 2 diabetes, hypertension, history of temporal arteritis, CAD. We tried multiple times tapering her off prednisone but each time she had rebound with symptoms and increased sed rate. Symptomatically stable currently on prednisone 10 mg daily. No headaches. No myalgias. No recent visual changes. Chronic prednisone use for several years. No recent bone density scan. Takes calcium and vitamin D. No recent falls.  Type 2 diabetes. Currently takes glimepiride 1 mg twice daily. No hypoglycemia. Most recent A1c 6.9%.  Patient denies any recent chest pains. She remains on aspirin.  Past Medical History  Diagnosis Date  . AODM 11/18/2008  . Carpal tunnel syndrome 08/04/2009  . FASCIITIS, PLANTAR 03/26/2010  . HYPERTENSION, BENIGN SYSTEMIC 10/12/2006  . INCONTINENCE, STRESS, FEMALE 10/12/2006  . Osteoporosis, unspecified 10/12/2006  . RHINITIS, ALLERGIC 10/12/2006  . TEMPORAL ARTERITIS 10/12/2006  . UNSTEADY GAIT 01/22/2007  . Kidney stone   . Inferior MI 8/12   No past surgical history on file.  reports that she has never smoked. She does not have any smokeless tobacco history on file. Her alcohol and drug histories not on file. family history is not on file. Allergies  Allergen Reactions  . Penicillins Hives  . Sulfadiazine Hives      Review of Systems  Constitutional: Positive for fatigue. Negative for fever and appetite change.  Respiratory: Negative for cough, shortness of breath and wheezing.   Cardiovascular: Positive for leg swelling. Negative for chest pain and palpitations.  Gastrointestinal: Negative for abdominal pain.  Neurological: Negative for dizziness, syncope and headaches.       Objective:   Physical Exam  Constitutional: She is oriented to person, place, and time. She appears well-developed and well-nourished.  HENT:  Mouth/Throat:  Oropharynx is clear and moist.  Neck: Neck supple. No thyromegaly present.  Cardiovascular: Normal rate and regular rhythm.   Pulmonary/Chest: Effort normal and breath sounds normal. No respiratory distress. She has no wheezes. She has no rales.  Musculoskeletal: She exhibits edema.       Patient has trace to 1+ edema lower legs bilaterally which is chronic and unchanged  Lymphadenopathy:    She has no cervical adenopathy.  Neurological: She is alert and oriented to person, place, and time.          Assessment & Plan:  #1 temporal arteritis. Recheck sedimentation rate. We've been challenged with tapering her prednisone in the past with recurrence of symptoms and elevation of sedimentation rate each time. #2 type 2 diabetes. Recheck A1c  #3 chronic prednisone use. High risk for bone density loss. Repeat DEXA scan  #4 hypertension stable  #5 history of CAD. No recent chest pain.  /continue Lipitor and aspirin and metoprolol. No recent symptoms

## 2011-10-12 ENCOUNTER — Other Ambulatory Visit: Payer: Self-pay | Admitting: Family Medicine

## 2011-10-12 DIAGNOSIS — E119 Type 2 diabetes mellitus without complications: Secondary | ICD-10-CM

## 2011-10-12 MED ORDER — PREDNISONE 5 MG PO TABS: ORAL_TABLET | ORAL | Status: DC

## 2011-10-12 NOTE — Progress Notes (Signed)
Quick Note:  Pt informed on home VM, I also mailed copy of labs to home with instructions highlighted. New dose and future labs ordered ______

## 2011-10-31 ENCOUNTER — Ambulatory Visit: Payer: Medicare Other | Admitting: Family Medicine

## 2012-01-04 ENCOUNTER — Ambulatory Visit (INDEPENDENT_AMBULATORY_CARE_PROVIDER_SITE_OTHER): Payer: Medicare Other | Admitting: Family Medicine

## 2012-01-04 ENCOUNTER — Encounter: Payer: Self-pay | Admitting: Family Medicine

## 2012-01-04 VITALS — BP 140/60 | Temp 97.5°F | Wt 141.0 lb

## 2012-01-04 DIAGNOSIS — I2119 ST elevation (STEMI) myocardial infarction involving other coronary artery of inferior wall: Secondary | ICD-10-CM

## 2012-01-04 DIAGNOSIS — M316 Other giant cell arteritis: Secondary | ICD-10-CM | POA: Diagnosis not present

## 2012-01-04 DIAGNOSIS — E119 Type 2 diabetes mellitus without complications: Secondary | ICD-10-CM | POA: Diagnosis not present

## 2012-01-04 LAB — HEMOGLOBIN A1C: Hgb A1c MFr Bld: 8.3 % — ABNORMAL HIGH (ref 4.6–6.5)

## 2012-01-04 NOTE — Progress Notes (Signed)
  Subjective:    Patient ID: [name], female    DOB: October 14, 1922, 78 y.o.   MRN: [id number]  HPI  Medical followup. Patient's history type 2 diabetes, history of CAD with prior inferior MI, hypertension, and temporal arteritis. She is currently taking regular prednisone 10 mg alternating with 5 mg. We've tried several times tapering her off prednisone but each time she's had recurrent headaches. She's been on prednisone for over 6 years. No recent visual changes. No recent headaches.  Patient's type 2 diabetes. Not monitoring blood sugars. No symptoms of hyperglycemia. Last A1c 8.3%. We increased her Amaryl to 1 mg twice a day. Takes Lipitor for hyperlipidemia. Lipids last fall at goal. She also continues on aspirin 1 daily  Past Medical History  Diagnosis Date  . AODM 11/18/2008  . Carpal tunnel syndrome 08/04/2009  . FASCIITIS, PLANTAR 03/26/2010  . HYPERTENSION, BENIGN SYSTEMIC 10/12/2006  . INCONTINENCE, STRESS, FEMALE 10/12/2006  . Osteoporosis, unspecified 10/12/2006  . RHINITIS, ALLERGIC 10/12/2006  . TEMPORAL ARTERITIS 10/12/2006  . UNSTEADY GAIT 01/22/2007  . Kidney stone   . Inferior MI 8/12   No past surgical history on file.  reports that she has never smoked. She does not have any smokeless tobacco history on file. Her alcohol and drug histories not on file. family history is not on file. Allergies  Allergen Reactions  . Penicillins Hives  . Sulfadiazine Hives      Review of Systems  Constitutional: Negative for fatigue and unexpected weight change.  Eyes: Negative for visual disturbance.  Respiratory: Negative for cough, chest tightness, shortness of breath and wheezing.   Cardiovascular: Negative for chest pain, palpitations and leg swelling.  Gastrointestinal: Negative for abdominal pain.  Genitourinary: Negative for dysuria.  Neurological: Negative for dizziness, seizures, syncope, weakness, light-headedness and headaches.       Objective:   Physical Exam    Constitutional: She is oriented to person, place, and time. She appears well-developed and well-nourished.  HENT:  Right Ear: External ear normal.  Left Ear: External ear normal.  Mouth/Throat: Oropharynx is clear and moist.  Neck: Neck supple. No thyromegaly present.  Cardiovascular: Normal rate and regular rhythm.   Pulmonary/Chest: Effort normal and breath sounds normal. No respiratory distress. She has no wheezes. She has no rales.  Musculoskeletal: She exhibits no edema.  Neurological: She is alert and oriented to person, place, and time.          Assessment & Plan:  #1 history of temporal arteritis. Recheck sedimentation rate. If stable continue tapering of prednisone  #2 type 2 diabetes. Recheck A1c  #3 history of coronary artery disease. Continue Lipitor, metoprolol, and aspirin. No recent chest pains. Repeat lipids at followup.

## 2012-01-06 NOTE — Progress Notes (Signed)
Quick Note:  Pt informed, copy of labs mailed to [pt home with instructions highlighted. ______

## 2012-01-25 DIAGNOSIS — E119 Type 2 diabetes mellitus without complications: Secondary | ICD-10-CM | POA: Diagnosis not present

## 2012-01-25 DIAGNOSIS — H524 Presbyopia: Secondary | ICD-10-CM | POA: Diagnosis not present

## 2012-01-25 DIAGNOSIS — H26499 Other secondary cataract, unspecified eye: Secondary | ICD-10-CM | POA: Diagnosis not present

## 2012-01-25 DIAGNOSIS — H01009 Unspecified blepharitis unspecified eye, unspecified eyelid: Secondary | ICD-10-CM | POA: Diagnosis not present

## 2012-01-25 DIAGNOSIS — H04129 Dry eye syndrome of unspecified lacrimal gland: Secondary | ICD-10-CM | POA: Diagnosis not present

## 2012-01-25 DIAGNOSIS — H40019 Open angle with borderline findings, low risk, unspecified eye: Secondary | ICD-10-CM | POA: Diagnosis not present

## 2012-01-30 ENCOUNTER — Other Ambulatory Visit: Payer: Self-pay | Admitting: Family Medicine

## 2012-01-30 DIAGNOSIS — Q245 Malformation of coronary vessels: Secondary | ICD-10-CM | POA: Diagnosis not present

## 2012-01-30 DIAGNOSIS — E785 Hyperlipidemia, unspecified: Secondary | ICD-10-CM | POA: Diagnosis not present

## 2012-01-30 DIAGNOSIS — I2510 Atherosclerotic heart disease of native coronary artery without angina pectoris: Secondary | ICD-10-CM | POA: Diagnosis not present

## 2012-03-05 ENCOUNTER — Other Ambulatory Visit: Payer: Self-pay | Admitting: Family Medicine

## 2012-03-13 ENCOUNTER — Other Ambulatory Visit: Payer: Self-pay | Admitting: Family Medicine

## 2012-04-30 ENCOUNTER — Encounter: Payer: Self-pay | Admitting: Family Medicine

## 2012-04-30 ENCOUNTER — Ambulatory Visit (INDEPENDENT_AMBULATORY_CARE_PROVIDER_SITE_OTHER): Payer: Medicare Other | Admitting: Family Medicine

## 2012-04-30 VITALS — BP 140/60 | Temp 97.7°F | Wt 139.0 lb

## 2012-04-30 DIAGNOSIS — S91009A Unspecified open wound, unspecified ankle, initial encounter: Secondary | ICD-10-CM

## 2012-04-30 DIAGNOSIS — M316 Other giant cell arteritis: Secondary | ICD-10-CM | POA: Diagnosis not present

## 2012-04-30 DIAGNOSIS — E119 Type 2 diabetes mellitus without complications: Secondary | ICD-10-CM

## 2012-04-30 DIAGNOSIS — I2119 ST elevation (STEMI) myocardial infarction involving other coronary artery of inferior wall: Secondary | ICD-10-CM | POA: Diagnosis not present

## 2012-04-30 DIAGNOSIS — Z23 Encounter for immunization: Secondary | ICD-10-CM

## 2012-04-30 DIAGNOSIS — S81819A Laceration without foreign body, unspecified lower leg, initial encounter: Secondary | ICD-10-CM

## 2012-04-30 DIAGNOSIS — S81809A Unspecified open wound, unspecified lower leg, initial encounter: Secondary | ICD-10-CM | POA: Diagnosis not present

## 2012-04-30 DIAGNOSIS — I10 Essential (primary) hypertension: Secondary | ICD-10-CM | POA: Diagnosis not present

## 2012-04-30 DIAGNOSIS — S81009A Unspecified open wound, unspecified knee, initial encounter: Secondary | ICD-10-CM | POA: Diagnosis not present

## 2012-04-30 LAB — SEDIMENTATION RATE: Sed Rate: 26 mm/hr — ABNORMAL HIGH (ref 0–22)

## 2012-04-30 LAB — LIPID PANEL
LDL Cholesterol: 70 mg/dL (ref 0–99)
Total CHOL/HDL Ratio: 2
Triglycerides: 143 mg/dL (ref 0.0–149.0)

## 2012-04-30 LAB — HEPATIC FUNCTION PANEL
ALT: 22 U/L (ref 0–35)
Alkaline Phosphatase: 65 U/L (ref 39–117)
Total Protein: 6.5 g/dL (ref 6.0–8.3)

## 2012-04-30 LAB — BASIC METABOLIC PANEL
BUN: 16 mg/dL (ref 6–23)
CO2: 32 mEq/L (ref 19–32)
Chloride: 104 mEq/L (ref 96–112)
Creatinine, Ser: 0.9 mg/dL (ref 0.4–1.2)

## 2012-04-30 NOTE — Patient Instructions (Signed)
Constipation in Adults Constipation is having fewer than 2 bowel movements per week. Usually, the stools are hard. As we grow older, constipation is more common. If you try to fix constipation with laxatives, the problem may get worse. This is because laxatives taken over a long period of time make the colon muscles weaker. A low-fiber diet, not taking in enough fluids, and taking some medicines may make these problems worse. MEDICATIONS THAT MAY CAUSE CONSTIPATION  Water pills (diuretics).   Calcium channel blockers (used to control blood pressure and for the heart).   Certain pain medicines (narcotics).   Anticholinergics.   Anti-inflammatory agents.   Antacids that contain aluminum.  DISEASES THAT CONTRIBUTE TO CONSTIPATION  Diabetes.   Parkinson's disease.   Dementia.   Stroke.   Depression.   Illnesses that cause problems with salt and water metabolism.  HOME CARE INSTRUCTIONS   Constipation is usually best cared for without medicines. Increasing dietary fiber and eating more fruits and vegetables is the best way to manage constipation.   Slowly increase fiber intake to 25 to 38 grams per day. Whole grains, fruits, vegetables, and legumes are good sources of fiber. A dietitian can further help you incorporate high-fiber foods into your diet.   Drink enough water and fluids to keep your urine clear or pale yellow.   A fiber supplement may be added to your diet if you cannot get enough fiber from foods.   Increasing your activities also helps improve regularity.   Suppositories, as suggested by your caregiver, will also help. If you are using antacids, such as aluminum or calcium containing products, it will be helpful to switch to products containing magnesium if your caregiver says it is okay.   If you have been given a liquid injection (enema) today, this is only a temporary measure. It should not be relied on for treatment of longstanding (chronic) constipation.    Stronger measures, such as magnesium sulfate, should be avoided if possible. This may cause uncontrollable diarrhea. Using magnesium sulfate may not allow you time to make it to the bathroom.  SEEK IMMEDIATE MEDICAL CARE IF:   There is bright red blood in the stool.   The constipation stays for more than 4 days.   There is belly (abdominal) or rectal pain.   You do not seem to be getting better.   You have any questions or concerns.  MAKE SURE YOU:   Understand these instructions.   Will watch your condition.   Will get help right away if you are not doing well or get worse.  Document Released: 04/29/2004 Document Revised: 07/21/2011 Document Reviewed: 07/05/2011 [facility] Patient Information 2012 [place], [place].  Follow up promptly for signs of leg infection such as increased redness, drainage, or fever.

## 2012-04-30 NOTE — Progress Notes (Signed)
  Subjective:    Patient ID: [name], female    DOB: 04/07/1923, 78 y.o.   MRN: [id number]  HPI  Patient seen for several items  Right leg laceration which occurred last Thursday. She hit against car door. Last tetanus unknown. 5 cm laceration. She did not present here or anywhere initially for medical care. No purulent drainage. She's using peroxide daily. Using topical antibiotic. Minimal pain. No problems with ambulation.  History of temporal arteritis. No recent headaches. We have had difficulty tapering prednisone in the past. Currently prednisone 5 mg daily. Needs repeat sedimentation rate. Vision stable.  Type 2 diabetes. Last A1c 8.3%. Takes Amaryl 2 mg daily. No symptoms of hyperglycemia. She has history of CAD. She is due for repeat lab work. She takes Lipitor 20 mg daily. No myalgias. No recent chest pains. Needs flu vaccine  Past Medical History  Diagnosis Date  . AODM 11/18/2008  . Carpal tunnel syndrome 08/04/2009  . FASCIITIS, PLANTAR 03/26/2010  . HYPERTENSION, BENIGN SYSTEMIC 10/12/2006  . INCONTINENCE, STRESS, FEMALE 10/12/2006  . Osteoporosis, unspecified 10/12/2006  . RHINITIS, ALLERGIC 10/12/2006  . TEMPORAL ARTERITIS 10/12/2006  . UNSTEADY GAIT 01/22/2007  . Kidney stone   . Inferior MI 8/12   No past surgical history on file.  reports that she has never smoked. She does not have any smokeless tobacco history on file. Her alcohol and drug histories not on file. family history is not on file. Allergies  Allergen Reactions  . Penicillins Hives  . Sulfadiazine Hives      Review of Systems  Constitutional: Negative for fever, chills and fatigue.  Eyes: Negative for visual disturbance.  Respiratory: Negative for cough, chest tightness, shortness of breath and wheezing.   Cardiovascular: Negative for chest pain, palpitations and leg swelling.  Genitourinary: Negative for dysuria.  Musculoskeletal: Negative for gait problem.  Neurological: Negative for  dizziness, seizures, syncope, weakness, light-headedness and headaches.       Objective:   Physical Exam  Constitutional: She appears well-developed and well-nourished.  Cardiovascular: Normal rate and regular rhythm.   Pulmonary/Chest: Effort normal and breath sounds normal. No respiratory distress. She has no wheezes. She has no rales.  Musculoskeletal: She exhibits no edema.  Skin:       Right lower lateral leg 5 cm laceration. Slightly gaping. No signs of secondary infection. No cellulitis changes. No purulent drainage.          Assessment & Plan:  #1 right leg laceration. This occurred several days ago. Cannot be sutured at this point. Continue good local wound care. Avoid hydrogen peroxide. Daily clean with soap and water and topical antibiotic. At this point no infection but followup promptly for any changes. Tetanus booster will be given #2 history of temporal arteritis. Recheck sedimentation rate. Trial very slow tapering of prednisone as tolerated if still controlled  #3 type 2 diabetes. Recheck A1c  #4 hyperlipidemia in patient with history of CAD. Recheck lipid and hepatic panel

## 2012-05-03 NOTE — Progress Notes (Signed)
Quick Note:  Called and spoke with pt and pt is aware. ______ 

## 2012-05-07 ENCOUNTER — Ambulatory Visit: Payer: Medicare Other | Admitting: Family Medicine

## 2012-06-22 ENCOUNTER — Other Ambulatory Visit: Payer: Self-pay | Admitting: Family Medicine

## 2012-08-13 DIAGNOSIS — Q245 Malformation of coronary vessels: Secondary | ICD-10-CM | POA: Diagnosis not present

## 2012-08-13 DIAGNOSIS — I2510 Atherosclerotic heart disease of native coronary artery without angina pectoris: Secondary | ICD-10-CM | POA: Diagnosis not present

## 2012-08-13 DIAGNOSIS — E785 Hyperlipidemia, unspecified: Secondary | ICD-10-CM | POA: Diagnosis not present

## 2012-08-29 ENCOUNTER — Encounter: Payer: Self-pay | Admitting: Family Medicine

## 2012-08-29 ENCOUNTER — Ambulatory Visit (INDEPENDENT_AMBULATORY_CARE_PROVIDER_SITE_OTHER): Payer: Medicare Other | Admitting: Family Medicine

## 2012-08-29 VITALS — BP 118/58 | Temp 97.4°F | Wt 140.0 lb

## 2012-08-29 DIAGNOSIS — I2510 Atherosclerotic heart disease of native coronary artery without angina pectoris: Secondary | ICD-10-CM

## 2012-08-29 DIAGNOSIS — I10 Essential (primary) hypertension: Secondary | ICD-10-CM

## 2012-08-29 DIAGNOSIS — M316 Other giant cell arteritis: Secondary | ICD-10-CM | POA: Diagnosis not present

## 2012-08-29 DIAGNOSIS — E119 Type 2 diabetes mellitus without complications: Secondary | ICD-10-CM

## 2012-08-29 DIAGNOSIS — K146 Glossodynia: Secondary | ICD-10-CM

## 2012-08-29 LAB — BASIC METABOLIC PANEL
BUN: 19 mg/dL (ref 6–23)
CO2: 31 mEq/L (ref 19–32)
Calcium: 9.7 mg/dL (ref 8.4–10.5)
Chloride: 102 mEq/L (ref 96–112)
Creatinine, Ser: 1 mg/dL (ref 0.4–1.2)

## 2012-08-29 MED ORDER — TRIAMCINOLONE ACETONIDE 0.1 % EX CREA: TOPICAL_CREAM | Freq: Two times a day (BID) | CUTANEOUS | Status: DC

## 2012-08-29 NOTE — Progress Notes (Signed)
  Subjective:    Patient ID: [name], female    DOB: May 28, 1923, 79 y.o.   MRN: [id number]  HPI Multiproblem followup:  History of temporal arteritis. Multiple attempts at getting off prednisone. Dxed many years ago and maintained on low dose prednisone and low dose methotrexate. She's had breakthrough headache and recurrent symptoms each time we tried taper. Most recent sedimentation rate 26. Low-dose prednisone 5 mg daily. No recent visual changes  Complains of burning sensation of tongue over the past several months. No extremity dysesthesias. No significant changes in taste. She does consume dairy products and eggs as well as some meats. No history of [name] deficiency  Type 2 diabetes. Most recent A1c 7.7%. Low-dose Amaryl. No hypoglycemia.  New problem scaly pruritic rash right anterior leg. Tried Neosporin without improvement itches frequently.  Rash present for several weeks.  No alleviating factors.  Past Medical History  Diagnosis Date  . AODM 11/18/2008  . Carpal tunnel syndrome 08/04/2009  . FASCIITIS, PLANTAR 03/26/2010  . HYPERTENSION, BENIGN SYSTEMIC 10/12/2006  . INCONTINENCE, STRESS, FEMALE 10/12/2006  . Osteoporosis, unspecified 10/12/2006  . RHINITIS, ALLERGIC 10/12/2006  . TEMPORAL ARTERITIS 10/12/2006  . UNSTEADY GAIT 01/22/2007  . Kidney stone   . Inferior MI 8/12   No past surgical history on file.  reports that she has never smoked. She does not have any smokeless tobacco history on file. Her alcohol and drug histories not on file. family history is not on file. Allergies  Allergen Reactions  . Penicillins Hives  . Sulfadiazine Hives      Review of Systems  Constitutional: Negative for fever and chills.  HENT: Negative for trouble swallowing and ear discharge.   Respiratory: Negative for cough and shortness of breath.   Cardiovascular: Negative for chest pain.  Gastrointestinal: Negative for nausea, vomiting and abdominal pain.  Skin: Positive for rash.    Neurological: Negative for dizziness and headaches.       Objective:   Physical Exam  Constitutional: She appears well-developed and well-nourished.  Neck: Neck supple. No thyromegaly present.  Cardiovascular: Normal rate and regular rhythm.   Pulmonary/Chest: Effort normal and breath sounds normal. No respiratory distress. She has no wheezes. She has no rales.  Musculoskeletal:       Trace edema lower legs bilaterally. No foot lesions  Lymphadenopathy:    She has no cervical adenopathy.  Skin: Rash noted.       Right anterior lower leg reveals area of rash about 6 x 6 cm. Erythematous base. Scaly surface.          Assessment & Plan:  #1 dysesthesias involving tongue. Rule out [name] deficiency. Check [name] level #2 history of temporal arteritis. Multiple prior failed attempts at weaning off prednisone. Recheck sedimentation rate. If low/stable consider very slow taper #3 type 2 diabetes. Recheck A1c. Not monitoring blood sugars at home. Dietary factors discussed #4 eczematous rash right leg. Triamcinolone 0.1% cream twice daily as needed

## 2012-08-30 ENCOUNTER — Other Ambulatory Visit: Payer: Self-pay | Admitting: *Deleted

## 2012-08-30 MED ORDER — METOPROLOL SUCCINATE ER 25 MG PO TB24: 25.0000 mg | ORAL_TABLET | Freq: Every day | ORAL | Status: DC

## 2012-08-30 MED ORDER — ATORVASTATIN CALCIUM 20 MG PO TABS: 20.0000 mg | ORAL_TABLET | Freq: Every day | ORAL | Status: DC

## 2012-08-30 NOTE — Progress Notes (Signed)
Quick Note:  Pt informed ______ 

## 2012-09-22 ENCOUNTER — Other Ambulatory Visit: Payer: Self-pay | Admitting: Family Medicine

## 2012-10-16 ENCOUNTER — Other Ambulatory Visit: Payer: Self-pay | Admitting: Family Medicine

## 2012-10-28 DIAGNOSIS — Z882 Allergy status to sulfonamides status: Secondary | ICD-10-CM | POA: Diagnosis not present

## 2012-10-28 DIAGNOSIS — E119 Type 2 diabetes mellitus without complications: Secondary | ICD-10-CM | POA: Diagnosis not present

## 2012-10-28 DIAGNOSIS — Z91013 Allergy to seafood: Secondary | ICD-10-CM | POA: Diagnosis not present

## 2012-10-28 DIAGNOSIS — L03221 Cellulitis of neck: Secondary | ICD-10-CM | POA: Diagnosis not present

## 2012-10-28 DIAGNOSIS — Z7982 Long term (current) use of aspirin: Secondary | ICD-10-CM | POA: Diagnosis not present

## 2012-10-28 DIAGNOSIS — I10 Essential (primary) hypertension: Secondary | ICD-10-CM | POA: Diagnosis not present

## 2012-10-28 DIAGNOSIS — Z79899 Other long term (current) drug therapy: Secondary | ICD-10-CM | POA: Diagnosis not present

## 2012-10-28 DIAGNOSIS — L0211 Cutaneous abscess of neck: Secondary | ICD-10-CM | POA: Diagnosis not present

## 2012-10-31 ENCOUNTER — Ambulatory Visit (INDEPENDENT_AMBULATORY_CARE_PROVIDER_SITE_OTHER): Payer: Medicare Other | Admitting: Family Medicine

## 2012-10-31 ENCOUNTER — Encounter: Payer: Self-pay | Admitting: Family Medicine

## 2012-10-31 VITALS — BP 144/64 | Temp 97.4°F | Wt 143.0 lb

## 2012-10-31 DIAGNOSIS — L03221 Cellulitis of neck: Secondary | ICD-10-CM

## 2012-10-31 DIAGNOSIS — E119 Type 2 diabetes mellitus without complications: Secondary | ICD-10-CM

## 2012-10-31 DIAGNOSIS — I10 Essential (primary) hypertension: Secondary | ICD-10-CM

## 2012-10-31 DIAGNOSIS — M810 Age-related osteoporosis without current pathological fracture: Secondary | ICD-10-CM

## 2012-10-31 DIAGNOSIS — M316 Other giant cell arteritis: Secondary | ICD-10-CM

## 2012-10-31 DIAGNOSIS — L0211 Cutaneous abscess of neck: Secondary | ICD-10-CM | POA: Diagnosis not present

## 2012-10-31 NOTE — Patient Instructions (Addendum)
Follow up promptly for any fever or recurrent neck redness.

## 2012-10-31 NOTE — Progress Notes (Signed)
  Subjective:    Patient ID: [name], female    DOB: 11/17/1922, 79 y.o.   MRN: [id number]  HPI Patient seen recent urgent care follow up  Here this past Sunday with redness involving left side of neck. Patient question some type of bite was treated for cellulitis with doxycycline. She had some itching. No fever. Redness is essentially resolved.  Her chronic problems include history of type 2 diabetes, history of CAD, hypertension, osteoporosis, giant cell arteritis. Intolerant to prednisone tapers in the past. She denies any recent DEXA scan. Not monitoring blood sugars. No symptoms of hyperglycemia. Most recent sedimentation rate 45. She does take calcium and vitamin D regularly. No recent falls. No recent fractures  Patient relates recent upper respiratory infection. Onset about 2 weeks. She hasn't cough, nasal congestion, and sneezing. No fever. Cough is nonproductive. No increased dyspnea over baseline  Past Medical History  Diagnosis Date  . AODM 11/18/2008  . Carpal tunnel syndrome 08/04/2009  . FASCIITIS, PLANTAR 03/26/2010  . HYPERTENSION, BENIGN SYSTEMIC 10/12/2006  . INCONTINENCE, STRESS, FEMALE 10/12/2006  . Osteoporosis, unspecified 10/12/2006  . RHINITIS, ALLERGIC 10/12/2006  . TEMPORAL ARTERITIS 10/12/2006  . UNSTEADY GAIT 01/22/2007  . Kidney stone   . Inferior MI 8/12   No past surgical history on file.  reports that she has never smoked. She does not have any smokeless tobacco history on file. Her alcohol and drug histories are not on file. family history is not on file. Allergies  Allergen Reactions  . Penicillins Hives  . Sulfadiazine Hives        Review of Systems  Constitutional: Positive for fatigue. Negative for fever, chills and unexpected weight change.  Eyes: Negative for visual disturbance.  Respiratory: Positive for cough. Negative for chest tightness, shortness of breath and wheezing.   Cardiovascular: Negative for chest pain, palpitations and  leg swelling.  Skin: Negative for rash.  Neurological: Negative for dizziness, seizures, syncope, weakness, light-headedness and headaches.       Objective:   Physical Exam  Constitutional: She appears well-developed and well-nourished. No distress.  Neck: Neck supple.  Cardiovascular: Normal rate and regular rhythm.   Pulmonary/Chest: Effort normal and breath sounds normal. No respiratory distress. She has no wheezes. She has no rales.  Musculoskeletal: She exhibits no edema.  Lymphadenopathy:    She has no cervical adenopathy.  Skin:  No evidence for any cellulitis changes involving left side of neck. She has nonspecific small erythematous papule left neck. No pustules. Nontender. No warmth.          Assessment & Plan:  #1 question of cellulitis recently left side of neck. Versus allergic reaction from bite. Greatly improved. Finish out doxycycline  #2 history of giant cell arteritis. Will not check sedimentation rate today with recent infectious issues as above. Repeat sedimentation rate in 2 months. #3 history of osteoporosis. Schedule DEXA scan at followup with chronic low dose prednisone use  #4 type 2 diabetes. Repeat A1c at followup in 2 months

## 2012-11-21 ENCOUNTER — Ambulatory Visit: Payer: Medicare Other | Admitting: Family Medicine

## 2012-11-21 ENCOUNTER — Telehealth: Payer: Self-pay | Admitting: Family Medicine

## 2012-11-21 NOTE — Telephone Encounter (Signed)
Patient Information:  Caller Name: [name]  Phone: [phone]  Patient: [name], [name]  Gender: Female  DOB: 10-Feb-1923  Age: 79 Years  PCP: [name] (Family Practice)  Office Follow Up:  Does the office need to follow up with this patient?: No  Instructions For The Office: N/A   Symptoms  Reason For Call & Symptoms: Seen in office a few weeks ago after boil on neck- seen in ER and prescribed antibiotics. She has new spot on face near L ear and L ear is hurting.  Reviewed Health History In EMR: Yes  Reviewed Medications In EMR: Yes  Reviewed Allergies In EMR: Yes  Reviewed Surgeries / Procedures: Yes  Date of Onset of Symptoms: 11/21/2012  Guideline(s) Used:  Skin Lesion - Moles or Growths  Disposition Per Guideline:   See Today in Office  Reason For Disposition Reached:   Looks like a boil, infected sore, or deep ulcer  Advice Given:  Skin Health: Applying Antibacterial ointment TID  Avoid sun exposure. Use sunscreen or wear protective clothing: long-sleeve shirts.  Call Back If:  Fever or pain occurs  Any change in the mole or growth  You become worse.  Patient Will Follow Care Advice:  YES  Appointment Scheduled:  11/22/2012 09:30:00 Appointment Scheduled Provider:  Evelena Mcclain [facility])

## 2012-11-22 ENCOUNTER — Encounter: Payer: Self-pay | Admitting: Family Medicine

## 2012-11-22 ENCOUNTER — Ambulatory Visit (INDEPENDENT_AMBULATORY_CARE_PROVIDER_SITE_OTHER): Payer: Medicare Other | Admitting: Family Medicine

## 2012-11-22 VITALS — BP 110/58 | Temp 97.4°F

## 2012-11-22 DIAGNOSIS — R21 Rash and other nonspecific skin eruption: Secondary | ICD-10-CM | POA: Diagnosis not present

## 2012-11-22 NOTE — Patient Instructions (Addendum)
Try over the counter antihistamine such as Claritan, Allegra, or Zyrtec. Continue with triamcinolone cream twice daily

## 2012-11-22 NOTE — Progress Notes (Signed)
  Subjective:    Patient ID: [name], female    DOB: September 10, 1922, 79 y.o.   MRN: [id number]  HPI Acute visit Pruritis anterior to left ear. Onset yesterday. No pain No vesicle.  Tried some steroid cream which helped No exacerbating factors. Past Medical History  Diagnosis Date  . AODM 11/18/2008  . Carpal tunnel syndrome 08/04/2009  . FASCIITIS, PLANTAR 03/26/2010  . HYPERTENSION, BENIGN SYSTEMIC 10/12/2006  . INCONTINENCE, STRESS, FEMALE 10/12/2006  . Osteoporosis, unspecified 10/12/2006  . RHINITIS, ALLERGIC 10/12/2006  . TEMPORAL ARTERITIS 10/12/2006  . UNSTEADY GAIT 01/22/2007  . Kidney stone   . Inferior MI 8/12   No past surgical history on file.  reports that she has never smoked. She does not have any smokeless tobacco history on file. Her alcohol and drug histories are not on file. family history is not on file. Allergies  Allergen Reactions  . Penicillins Hives  . Sulfadiazine Hives      Review of Systems  Constitutional: Negative for fever and chills.  HENT: Negative for ear pain and ear discharge.   Skin: Positive for rash.  Hematological: Negative for adenopathy.       Objective:   Physical Exam  Constitutional: She appears well-developed and well-nourished.  HENT:  Right Ear: External ear normal.  Left Ear: External ear normal.  Patient has minimal cerumen left ear canal otherwise normal  Neck: Neck supple.  Cardiovascular: Normal rate and regular rhythm.   Pulmonary/Chest: Effort normal and breath sounds normal. No respiratory distress. She has no wheezes.  Lymphadenopathy:    She has no cervical adenopathy.  Skin:  Patient has somewhat oval-shaped area of erythema with minimal swelling and warmth but no tenderness. No vesicles. No pustules. Area involved is anterior to left ear and is approximately one and 0.5 x 3 cm          Assessment & Plan:  Probable allergic rash. She has no fever, chills, or tenderness to suggest cellulitis. Cannot see  any obvious bite marks. Try over-the-counter antihistamine such as Claritin or Allegra. Continue triamcinolone cream as needed.

## 2012-12-12 DIAGNOSIS — H35039 Hypertensive retinopathy, unspecified eye: Secondary | ICD-10-CM | POA: Diagnosis not present

## 2012-12-12 DIAGNOSIS — E119 Type 2 diabetes mellitus without complications: Secondary | ICD-10-CM | POA: Diagnosis not present

## 2012-12-12 DIAGNOSIS — H01009 Unspecified blepharitis unspecified eye, unspecified eyelid: Secondary | ICD-10-CM | POA: Diagnosis not present

## 2012-12-12 DIAGNOSIS — H40019 Open angle with borderline findings, low risk, unspecified eye: Secondary | ICD-10-CM | POA: Diagnosis not present

## 2012-12-12 DIAGNOSIS — H04129 Dry eye syndrome of unspecified lacrimal gland: Secondary | ICD-10-CM | POA: Diagnosis not present

## 2012-12-12 DIAGNOSIS — H26499 Other secondary cataract, unspecified eye: Secondary | ICD-10-CM | POA: Diagnosis not present

## 2012-12-13 LAB — HM DIABETES EYE EXAM

## 2012-12-27 ENCOUNTER — Encounter: Payer: Self-pay | Admitting: Family Medicine

## 2012-12-27 ENCOUNTER — Ambulatory Visit (INDEPENDENT_AMBULATORY_CARE_PROVIDER_SITE_OTHER): Payer: Medicare Other | Admitting: Family Medicine

## 2012-12-27 VITALS — BP 152/62 | Temp 98.0°F | Wt 146.0 lb

## 2012-12-27 DIAGNOSIS — I10 Essential (primary) hypertension: Secondary | ICD-10-CM | POA: Diagnosis not present

## 2012-12-27 DIAGNOSIS — M316 Other giant cell arteritis: Secondary | ICD-10-CM | POA: Diagnosis not present

## 2012-12-27 DIAGNOSIS — E119 Type 2 diabetes mellitus without complications: Secondary | ICD-10-CM

## 2012-12-27 LAB — SEDIMENTATION RATE: Sed Rate: 64 mm/hr — ABNORMAL HIGH (ref 0–22)

## 2012-12-27 LAB — HM DIABETES FOOT EXAM: HM Diabetic Foot Exam: NORMAL

## 2012-12-27 LAB — HEMOGLOBIN A1C: Hgb A1c MFr Bld: 8.5 % — ABNORMAL HIGH (ref 4.6–6.5)

## 2012-12-27 MED ORDER — AMLODIPINE BESYLATE 2.5 MG PO TABS: 2.5000 mg | ORAL_TABLET | Freq: Every day | ORAL | Status: DC

## 2012-12-27 NOTE — Progress Notes (Signed)
  Subjective:    Patient ID: [name], female    DOB: February 03, 1923, 79 y.o.   MRN: [id number]  HPI  Patient is seen for medical followup. Chronic problems include history of type 2 diabetes, hypertension, CAD, temporal arteritis, allergic rhinitis, osteoporosis. Medications reviewed. She does not have any recent reported symptoms of hypoglycemia or hyperglycemia. She does not monitor blood sugars. Last A1c 7. 6%.  She had eye exam 2 weeks ago and was told she was having some type of laser surgery. Previous cataract surgery. No history of diabetic retinopathy  Denies recent chest pains. She remains on low-dose prednisone and methotrexate. We have been unable to taper her prednisone further  Past Medical History  Diagnosis Date  . AODM 11/18/2008  . Carpal tunnel syndrome 08/04/2009  . FASCIITIS, PLANTAR 03/26/2010  . HYPERTENSION, BENIGN SYSTEMIC 10/12/2006  . INCONTINENCE, STRESS, FEMALE 10/12/2006  . Osteoporosis, unspecified 10/12/2006  . RHINITIS, ALLERGIC 10/12/2006  . TEMPORAL ARTERITIS 10/12/2006  . UNSTEADY GAIT 01/22/2007  . Kidney stone   . Inferior MI 8/12   No past surgical history on file.  reports that she has never smoked. She does not have any smokeless tobacco history on file. Her alcohol and drug histories are not on file. family history is not on file. Allergies  Allergen Reactions  . Penicillins Hives  . Sulfadiazine Hives     Review of Systems  Constitutional: Negative for fatigue and unexpected weight change.  Eyes: Negative for visual disturbance.  Respiratory: Negative for cough, chest tightness, shortness of breath and wheezing.   Cardiovascular: Negative for chest pain, palpitations and leg swelling.  Endocrine: Negative for polydipsia, polyphagia and polyuria.  Neurological: Negative for dizziness, seizures, syncope, weakness and light-headedness.       Objective:   Physical Exam  Constitutional: She is oriented to person, place, and time. She appears  well-developed and well-nourished.  HENT:  Mouth/Throat: Oropharynx is clear and moist.  Neck: Neck supple.  Cardiovascular: Normal rate and regular rhythm.   Pulmonary/Chest: Effort normal and breath sounds normal. No respiratory distress. She has no wheezes. She has no rales.  Musculoskeletal:  Only minimal trace edema legs bilaterally  Lymphadenopathy:    She has no cervical adenopathy.  Neurological: She is alert and oriented to person, place, and time. No cranial nerve deficit.  Skin: No rash noted.          Assessment & Plan:  #1 hypertension. Suboptimal control. Start amlodipine 2.5 mg once daily. Reassess blood pressure one month #2 type 2 diabetes. Recheck hemoglobin A1c #3 hyperlipidemia. History of CAD. Recent lipids at goal #4 history of temporal arteritis. Symptomatically stable. Repeat sedimentation rate

## 2012-12-28 ENCOUNTER — Telehealth: Payer: Self-pay | Admitting: *Deleted

## 2012-12-28 DIAGNOSIS — R799 Abnormal finding of blood chemistry, unspecified: Secondary | ICD-10-CM

## 2012-12-28 MED ORDER — GLIMEPIRIDE 4 MG PO TABS: 4.0000 mg | ORAL_TABLET | Freq: Every day | ORAL | Status: DC

## 2012-12-28 NOTE — Telephone Encounter (Signed)
See lab result note 12/28/12

## 2013-01-28 ENCOUNTER — Encounter: Payer: Self-pay | Admitting: Family Medicine

## 2013-01-28 ENCOUNTER — Ambulatory Visit (INDEPENDENT_AMBULATORY_CARE_PROVIDER_SITE_OTHER): Payer: Medicare Other | Admitting: Family Medicine

## 2013-01-28 VITALS — BP 150/70 | Temp 98.2°F | Wt 146.0 lb

## 2013-01-28 DIAGNOSIS — R609 Edema, unspecified: Secondary | ICD-10-CM | POA: Diagnosis not present

## 2013-01-28 DIAGNOSIS — R600 Localized edema: Secondary | ICD-10-CM

## 2013-01-28 DIAGNOSIS — I10 Essential (primary) hypertension: Secondary | ICD-10-CM | POA: Diagnosis not present

## 2013-01-28 MED ORDER — LOSARTAN POTASSIUM 50 MG PO TABS: 50.0000 mg | ORAL_TABLET | Freq: Every day | ORAL | Status: DC

## 2013-01-28 NOTE — Patient Instructions (Signed)
Stop Amlodipine Start Losartan 50 mg once daily

## 2013-01-28 NOTE — Progress Notes (Signed)
  Subjective:    Patient ID: [name], female    DOB: [birth year], 79 y.o.   MRN: [id number]  HPI Followup hypertension. We added amlodipine low dosage 2.5 mg once daily last visit She's not monitoring blood pressures at home. She has had some increased peripheral edema since starting the amlodipine. She denies any dizziness. No headaches.  Blood sugar has been elevated with recent A1c 8.5%. No symptoms of hyperglycemia. Currently taking glimepiride 4 mg daily  Past Medical History  Diagnosis Date  . AODM 11/18/2008  . Carpal tunnel syndrome 08/04/2009  . FASCIITIS, PLANTAR 03/26/2010  . HYPERTENSION, BENIGN SYSTEMIC 10/12/2006  . INCONTINENCE, STRESS, FEMALE 10/12/2006  . Osteoporosis, unspecified 10/12/2006  . RHINITIS, ALLERGIC 10/12/2006  . TEMPORAL ARTERITIS 10/12/2006  . UNSTEADY GAIT 01/22/2007  . Kidney stone   . Inferior MI 8/12   No past surgical history on file.  reports that she has never smoked. She does not have any smokeless tobacco history on file. Her alcohol and drug histories are not on file. family history is not on file. Allergies  Allergen Reactions  . Penicillins Hives  . Sulfadiazine Hives      Review of Systems  Constitutional: Positive for fatigue.  Eyes: Negative for visual disturbance.  Respiratory: Negative for cough, chest tightness, shortness of breath and wheezing.   Cardiovascular: Positive for leg swelling. Negative for chest pain and palpitations.  Neurological: Negative for dizziness, seizures, syncope, weakness, light-headedness and headaches.       Objective:   Physical Exam  Constitutional: She appears well-developed and well-nourished.  Cardiovascular: Normal rate and regular rhythm.   Pulmonary/Chest: Effort normal and breath sounds normal. No respiratory distress. She has no wheezes. She has no rales.  Musculoskeletal: She exhibits edema.  Has trace edema ankles legs bilaterally          Assessment & Plan:  Hypertension.  Suboptimal control. Edema probably related to amlodipine. Discontinue amlodipine. Start losartan 50 mg once daily. Reassess one month

## 2013-01-29 ENCOUNTER — Ambulatory Visit: Payer: Medicare Other | Admitting: Family Medicine

## 2013-02-11 ENCOUNTER — Other Ambulatory Visit: Payer: Self-pay | Admitting: Family Medicine

## 2013-02-13 ENCOUNTER — Encounter: Payer: Self-pay | Admitting: Family Medicine

## 2013-02-13 ENCOUNTER — Ambulatory Visit (INDEPENDENT_AMBULATORY_CARE_PROVIDER_SITE_OTHER): Payer: Medicare Other | Admitting: Family Medicine

## 2013-02-13 VITALS — BP 130/70 | HR 76 | Temp 97.6°F | Ht 63.0 in | Wt 145.0 lb

## 2013-02-13 DIAGNOSIS — R300 Dysuria: Secondary | ICD-10-CM | POA: Diagnosis not present

## 2013-02-13 MED ORDER — ATORVASTATIN CALCIUM 20 MG PO TABS: 20.0000 mg | ORAL_TABLET | Freq: Every day | ORAL | Status: DC

## 2013-02-13 MED ORDER — CIPROFLOXACIN HCL 500 MG PO TABS: 500.0000 mg | ORAL_TABLET | Freq: Two times a day (BID) | ORAL | Status: DC

## 2013-02-13 NOTE — Progress Notes (Signed)
  Subjective:    Patient ID: [name], female    DOB: Mar 22, 1923, 79 y.o.   MRN: [id number]  HPI Patient seen with 2 day history of dysuria She has mild burning with urination and mild suprapubic discomfort. Denies any fever or chills. No nausea or vomiting. No flank pain. She does have some chronic lumbar back pain which is unchanged. No gross hematuria. Allergy to penicillin and sulfa.  Recent change of blood pressure medication from amlodipine to losartan secondary to edema issues and this has improved. No headaches or other side effects.  Past Medical History  Diagnosis Date  . AODM 11/18/2008  . Carpal tunnel syndrome 08/04/2009  . FASCIITIS, PLANTAR 03/26/2010  . HYPERTENSION, BENIGN SYSTEMIC 10/12/2006  . INCONTINENCE, STRESS, FEMALE 10/12/2006  . Osteoporosis, unspecified 10/12/2006  . RHINITIS, ALLERGIC 10/12/2006  . TEMPORAL ARTERITIS 10/12/2006  . UNSTEADY GAIT 01/22/2007  . Kidney stone   . Inferior MI 8/12   No past surgical history on file.  reports that she has never smoked. She does not have any smokeless tobacco history on file. Her alcohol and drug histories are not on file. family history is not on file. Allergies  Allergen Reactions  . Penicillins Hives  . Sulfadiazine Hives  '     Review of Systems  Constitutional: Positive for fatigue. Negative for fever, chills and appetite change.  Gastrointestinal: Negative for nausea, vomiting, abdominal pain, diarrhea and constipation.  Genitourinary: Positive for dysuria and frequency.  Musculoskeletal: Negative for back pain.  Neurological: Negative for dizziness.       Objective:   Physical Exam  Constitutional: She appears well-developed and well-nourished.  HENT:  Head: Normocephalic and atraumatic.  Neck: Neck supple. No thyromegaly present.  Cardiovascular: Normal rate, regular rhythm and normal heart sounds.   Pulmonary/Chest: Breath sounds normal.  Abdominal: Soft. Bowel sounds are normal. There is  no tenderness.          Assessment & Plan:  Dysuria. Probable uncomplicated cystitis. Start Cipro 500 mg twice a day for 7 days. Drink plenty of fluids. Urine culture not obtained as patient was unable to give urine specimen. We've encouraged her to bring back specimen tomorrow if symptoms not promptly improving

## 2013-02-13 NOTE — Patient Instructions (Addendum)
Urinary Tract Infection  Urinary tract infections (UTIs) can develop anywhere along your urinary tract. Your urinary tract is your body's drainage system for removing wastes and extra water. Your urinary tract includes two kidneys, two ureters, a bladder, and a urethra. Your kidneys are a pair of bean-shaped organs. Each kidney is about the size of your fist. They are located below your ribs, one on each side of your spine.  CAUSES  Infections are caused by microbes, which are microscopic organisms, including fungi, viruses, and bacteria. These organisms are so small that they can only be seen through a microscope. Bacteria are the microbes that most commonly cause UTIs.  SYMPTOMS   Symptoms of UTIs may vary by age and gender of the patient and by the location of the infection. Symptoms in young women typically include a frequent and intense urge to urinate and a painful, burning feeling in the bladder or urethra during urination. Older women and men are more likely to be tired, shaky, and weak and have muscle aches and abdominal pain. A fever may mean the infection is in your kidneys. Other symptoms of a kidney infection include pain in your back or sides below the ribs, nausea, and vomiting.  DIAGNOSIS  To diagnose a UTI, your caregiver will ask you about your symptoms. Your caregiver also will ask to provide a urine sample. The urine sample will be tested for bacteria and white blood cells. White blood cells are made by your body to help fight infection.  TREATMENT   Typically, UTIs can be treated with medication. Because most UTIs are caused by a bacterial infection, they usually can be treated with the use of antibiotics. The choice of antibiotic and length of treatment depend on your symptoms and the type of bacteria causing your infection.  HOME CARE INSTRUCTIONS   If you were prescribed antibiotics, take them exactly as your caregiver instructs you. Finish the medication even if you feel better after you  have only taken some of the medication.   Drink enough water and fluids to keep your urine clear or pale yellow.   Avoid caffeine, tea, and carbonated beverages. They tend to irritate your bladder.   Empty your bladder often. Avoid holding urine for long periods of time.   Empty your bladder before and after sexual intercourse.   After a bowel movement, women should cleanse from front to back. Use each tissue only once.  SEEK MEDICAL CARE IF:    You have back pain.   You develop a fever.   Your symptoms do not begin to resolve within 3 days.  SEEK IMMEDIATE MEDICAL CARE IF:    You have severe back pain or lower abdominal pain.   You develop chills.   You have nausea or vomiting.   You have continued burning or discomfort with urination.  MAKE SURE YOU:    Understand these instructions.   Will watch your condition.   Will get help right away if you are not doing well or get worse.  Document Released: 05/11/2005 Document Revised: 01/31/2012 Document Reviewed: 09/09/2011  [employer] Patient Information 2014 [employer]

## 2013-02-14 ENCOUNTER — Other Ambulatory Visit (INDEPENDENT_AMBULATORY_CARE_PROVIDER_SITE_OTHER): Payer: Medicare Other | Admitting: Family Medicine

## 2013-02-14 DIAGNOSIS — R300 Dysuria: Secondary | ICD-10-CM | POA: Diagnosis not present

## 2013-02-14 LAB — POCT URINALYSIS DIPSTICK
Nitrite, UA: NEGATIVE
Protein, UA: NEGATIVE
Urobilinogen, UA: 0.2
pH, UA: 6

## 2013-02-14 NOTE — Addendum Note (Signed)
Addended by: Rita Ohara R on: 02/14/2013 10:54 AM   Modules accepted: Orders

## 2013-02-17 LAB — URINE CULTURE: Colony Count: 15000

## 2013-02-19 ENCOUNTER — Other Ambulatory Visit: Payer: Self-pay

## 2013-02-19 MED ORDER — CEPHALEXIN 500 MG PO CAPS: 500.0000 mg | ORAL_CAPSULE | Freq: Three times a day (TID) | ORAL | Status: DC

## 2013-02-19 NOTE — Telephone Encounter (Signed)
Pt returning your call. Pt said she has not taken the rest of the first med b/c it upset her stomach. Pls call pt.

## 2013-02-26 ENCOUNTER — Ambulatory Visit (INDEPENDENT_AMBULATORY_CARE_PROVIDER_SITE_OTHER): Payer: Medicare Other | Admitting: Family Medicine

## 2013-02-26 ENCOUNTER — Encounter: Payer: Self-pay | Admitting: Family Medicine

## 2013-02-26 VITALS — BP 128/60 | HR 76 | Temp 97.7°F | Wt 143.0 lb

## 2013-02-26 DIAGNOSIS — I2510 Atherosclerotic heart disease of native coronary artery without angina pectoris: Secondary | ICD-10-CM | POA: Diagnosis not present

## 2013-02-26 DIAGNOSIS — E119 Type 2 diabetes mellitus without complications: Secondary | ICD-10-CM

## 2013-02-26 DIAGNOSIS — I10 Essential (primary) hypertension: Secondary | ICD-10-CM | POA: Diagnosis not present

## 2013-02-26 DIAGNOSIS — E1165 Type 2 diabetes mellitus with hyperglycemia: Secondary | ICD-10-CM

## 2013-02-26 MED ORDER — METOPROLOL SUCCINATE ER 25 MG PO TB24: 25.0000 mg | ORAL_TABLET | Freq: Every day | ORAL | Status: DC

## 2013-02-26 NOTE — Progress Notes (Signed)
  Subjective:    Patient ID: [name], female    DOB: 1923-04-24, 79 y.o.   MRN: [id number]  HPI Patient seen for medical followup Recent UTI with Proteus. Improved following Keflex. No recurrent urinary symptoms  Hypertension recently poorly controlled. We started amlodipine but she had edema issues. Switched to losartan and edema is improved. Blood pressures have been very stable. No orthostasis. No dizziness. No headaches.  Blood sugars suboptimal control. Not checking fastings very often. No symptoms of hyperglycemia other than some thirst at night. Compliant with medications.  Past Medical History  Diagnosis Date  . AODM 11/18/2008  . Carpal tunnel syndrome 08/04/2009  . FASCIITIS, PLANTAR 03/26/2010  . HYPERTENSION, BENIGN SYSTEMIC 10/12/2006  . INCONTINENCE, STRESS, FEMALE 10/12/2006  . Osteoporosis, unspecified 10/12/2006  . RHINITIS, ALLERGIC 10/12/2006  . TEMPORAL ARTERITIS 10/12/2006  . UNSTEADY GAIT 01/22/2007  . Kidney stone   . Inferior MI 8/12   No past surgical history on file.  reports that she has never smoked. She does not have any smokeless tobacco history on file. Her alcohol and drug histories are not on file. family history is not on file. Allergies  Allergen Reactions  . Penicillins Hives  . Sulfadiazine Hives      Review of Systems  Constitutional: Negative for fatigue and unexpected weight change.  HENT: Negative for trouble swallowing.   Respiratory: Negative for cough, chest tightness, shortness of breath and wheezing.   Cardiovascular: Negative for chest pain, palpitations and leg swelling.  Gastrointestinal: Negative for nausea and vomiting.  Endocrine: Negative for polydipsia and polyuria.  Neurological: Negative for dizziness, seizures, syncope, weakness, light-headedness and headaches.  Psychiatric/Behavioral: Negative for confusion.       Objective:   Physical Exam  Constitutional: She is oriented to person, place, and time. She appears  well-developed and well-nourished.  HENT:  Right Ear: External ear normal.  Left Ear: External ear normal.  Mouth/Throat: Oropharynx is clear and moist.  Neck: Neck supple. No thyromegaly present.  Cardiovascular: Normal rate and regular rhythm.   Pulmonary/Chest: Effort normal and breath sounds normal. No respiratory distress. She has no wheezes. She has no rales.  Musculoskeletal: She exhibits no edema.  Neurological: She is alert and oriented to person, place, and time.  Psychiatric: She has a normal mood and affect. Her behavior is normal.          Assessment & Plan:  #1 hypertension.  Stable and improved.  Tolerating Losartan.  Continue same meds # 2 Type 2 diabetes.  Sub optimal control.  Repeat A1C at follow up.   #3 CAD.  No recent chest pain.  She remains on ASA, metoprolol, and Lipitor.  Repeat lipids at follow up.

## 2013-03-07 ENCOUNTER — Telehealth: Payer: Self-pay | Admitting: Family Medicine

## 2013-03-07 NOTE — Telephone Encounter (Signed)
Refill was sent on 02/26/13 pt will call back if she has not received by Monday

## 2013-03-07 NOTE — Telephone Encounter (Signed)
Pt states she received one med but did not receive the refill: metoprolol succinate (TOPROL-XL) 25 MG 24 hr tablet [facility]

## 2013-03-12 NOTE — Telephone Encounter (Signed)
PT called and stated that she has not received this RX yet. She would like a 7 day supply sent to [facility] at [place]. She would then like a 1 month fu of her metoprolol succinate (TOPROL-XL) 25 MG 24 hr tablet [facility], following the 7 day supply. Please assist.

## 2013-03-13 MED ORDER — METOPROLOL SUCCINATE ER 25 MG PO TB24: 25.0000 mg | ORAL_TABLET | Freq: Every day | ORAL | Status: DC

## 2013-03-13 NOTE — Addendum Note (Signed)
Addended by: Thomasena Edis on: 03/13/2013 01:35 PM   Modules accepted: Orders

## 2013-03-13 NOTE — Telephone Encounter (Signed)
Called [facility] and they stated that the patient should have received her mail order on the  03/05/13. Called pt and she stated she still has not received her mail order. Sent a 30 day supply to [facility] for the patient until her mail order comes.

## 2013-03-19 ENCOUNTER — Other Ambulatory Visit: Payer: Self-pay | Admitting: Family Medicine

## 2013-03-19 NOTE — Telephone Encounter (Signed)
Refill for 3 months. 

## 2013-04-14 ENCOUNTER — Other Ambulatory Visit: Payer: Self-pay | Admitting: Family Medicine

## 2013-04-17 ENCOUNTER — Encounter: Payer: Self-pay | Admitting: Family Medicine

## 2013-04-17 ENCOUNTER — Ambulatory Visit (INDEPENDENT_AMBULATORY_CARE_PROVIDER_SITE_OTHER): Payer: Medicare Other | Admitting: Family Medicine

## 2013-04-17 VITALS — BP 140/62 | HR 72 | Temp 97.6°F | Wt 142.0 lb

## 2013-04-17 DIAGNOSIS — H8110 Benign paroxysmal vertigo, unspecified ear: Secondary | ICD-10-CM | POA: Diagnosis not present

## 2013-04-17 DIAGNOSIS — K1370 Unspecified lesions of oral mucosa: Secondary | ICD-10-CM | POA: Diagnosis not present

## 2013-04-17 DIAGNOSIS — R208 Other disturbances of skin sensation: Secondary | ICD-10-CM

## 2013-04-17 MED ORDER — AZELASTINE HCL 0.1 % NA SOLN: 2.0000 | Freq: Two times a day (BID) | NASAL | Status: DC

## 2013-04-17 NOTE — Patient Instructions (Addendum)

## 2013-04-17 NOTE — Progress Notes (Signed)
  Subjective:    Patient ID: [name], female    DOB: [birth year], 79 y.o.   MRN: [id number]  HPI Patient seen as an acute visit for dizziness She describes onset about 3-4 days ago. She states her head feels somewhat "heavy". Denies any headache. On further questioning, she is having more vertigo which is worse first thing in the morning when rolling over in bed. She has chronic hearing loss which is unchanged. She has some chronic tinnitus. Denies any ataxia. No focal weakness. No recent falls. She's had similar occurrences in the past.  Her chronic problems include history of temporal arteritis, allergic rhinitis, type 2 diabetes, osteoporosis, CAD, hypertension. No recent chest pains. No recent change of vision.  Complaining of burning mouth symptoms off and on for a few months. Recent [name] level normal. No recent change of toothpaste. No pain with swallowing.  Past Medical History  Diagnosis Date  . AODM 11/18/2008  . Carpal tunnel syndrome 08/04/2009  . FASCIITIS, PLANTAR 03/26/2010  . HYPERTENSION, BENIGN SYSTEMIC 10/12/2006  . INCONTINENCE, STRESS, FEMALE 10/12/2006  . Osteoporosis, unspecified 10/12/2006  . RHINITIS, ALLERGIC 10/12/2006  . TEMPORAL ARTERITIS 10/12/2006  . UNSTEADY GAIT 01/22/2007  . Kidney stone   . Inferior MI 8/12   No past surgical history on file.  reports that she has never smoked. She does not have any smokeless tobacco history on file. Her alcohol and drug histories are not on file. family history is not on file. Allergies  Allergen Reactions  . Penicillins Hives  . Sulfadiazine Hives       Review of Systems  Constitutional: Negative for fever, chills, appetite change and unexpected weight change.  HENT: Positive for hearing loss (Chronic and unchanged) and tinnitus. Negative for trouble swallowing.   Eyes: Negative for visual disturbance.  Respiratory: Negative for cough and shortness of breath.   Cardiovascular: Negative for chest pain.   Genitourinary: Negative for dysuria.  Neurological: Positive for dizziness. Negative for seizures, syncope, weakness and headaches.  Psychiatric/Behavioral: Negative for confusion.       Objective:   Physical Exam  Constitutional: She is oriented to person, place, and time. She appears well-developed and well-nourished.  HENT:  Right Ear: External ear normal.  Left Ear: External ear normal.  Eyes: Pupils are equal, round, and reactive to light.  Neck: Neck supple. No thyromegaly present.  No carotid bruit  Cardiovascular: Normal rate and regular rhythm.   Pulmonary/Chest: Effort normal and breath sounds normal. No respiratory distress. She has no wheezes. She has no rales.  Musculoskeletal: She exhibits no edema.  Lymphadenopathy:    She has no cervical adenopathy.  Neurological: She is alert and oriented to person, place, and time. No cranial nerve deficit.  No focal weakness. No ataxia. Gait is normal unassisted. No nystagmus.  Psychiatric: She has a normal mood and affect. Her behavior is normal. Judgment and thought content normal.          Assessment & Plan:  #1 vertigo. Suspect benign peripheral positional vertigo. Nonfocal exam. Recommend observation. Consider referral for vestibular rehabilitation exercises if symptoms persist.  We've recommended avoidance of medications like meclizine given her age and high risk for falls #2 burning mouth syndrome. Recent [name] levels normal. We discussed that effective treatments include medications like tricyclics but given her age and risk of falls we would recommend against unless her symptoms become severe.   Could consider low-dose gabapentin but she's not interested in further medications at this time

## 2013-04-30 ENCOUNTER — Encounter: Payer: Self-pay | Admitting: Family Medicine

## 2013-04-30 ENCOUNTER — Ambulatory Visit (INDEPENDENT_AMBULATORY_CARE_PROVIDER_SITE_OTHER): Payer: Medicare Other | Admitting: Family Medicine

## 2013-04-30 VITALS — BP 128/68 | HR 70 | Temp 97.8°F | Wt 142.0 lb

## 2013-04-30 DIAGNOSIS — E1165 Type 2 diabetes mellitus with hyperglycemia: Secondary | ICD-10-CM

## 2013-04-30 DIAGNOSIS — M316 Other giant cell arteritis: Secondary | ICD-10-CM | POA: Diagnosis not present

## 2013-04-30 DIAGNOSIS — Z23 Encounter for immunization: Secondary | ICD-10-CM | POA: Diagnosis not present

## 2013-04-30 DIAGNOSIS — E119 Type 2 diabetes mellitus without complications: Secondary | ICD-10-CM

## 2013-04-30 DIAGNOSIS — M810 Age-related osteoporosis without current pathological fracture: Secondary | ICD-10-CM

## 2013-04-30 DIAGNOSIS — I2510 Atherosclerotic heart disease of native coronary artery without angina pectoris: Secondary | ICD-10-CM | POA: Diagnosis not present

## 2013-04-30 LAB — HEPATIC FUNCTION PANEL
AST: 21 U/L (ref 0–37)
Albumin: 3.4 g/dL — ABNORMAL LOW (ref 3.5–5.2)
Alkaline Phosphatase: 55 U/L (ref 39–117)
Total Protein: 6.5 g/dL (ref 6.0–8.3)

## 2013-04-30 LAB — LIPID PANEL
Cholesterol: 158 mg/dL (ref 0–200)
HDL: 60.7 mg/dL (ref >39.00)
Total CHOL/HDL Ratio: 3
Triglycerides: 94 mg/dL (ref 0.0–149.0)

## 2013-04-30 LAB — BASIC METABOLIC PANEL
Calcium: 9.6 mg/dL (ref 8.4–10.5)
Creatinine, Ser: 0.9 mg/dL (ref 0.4–1.2)
GFR: 66.15 mL/min (ref >60.00)
Sodium: 139 mEq/L (ref 135–145)

## 2013-04-30 LAB — HEMOGLOBIN A1C: Hgb A1c MFr Bld: 9.1 % — ABNORMAL HIGH (ref 4.6–6.5)

## 2013-04-30 NOTE — Progress Notes (Signed)
  Subjective:    Patient ID: [name], female    DOB: [birth year], 79 y.o.   MRN: [id number]  HPI  Patient has history of type 2 diabetes, CAD, hypertension, temporal arteritis. She is very hard of hearing and communication is difficult  She has type 2 diabetes. We recently increased her Amaryl 4 mg daily. Recent A1c 8.5%. No hypoglycemic symptoms. Denies any polyuria or polydipsia.  Blood pressures have been well controlled with losartan. No recent orthostasis. She takes atorvastatin for hyperlipidemia. No myalgias.  She takes low-dose prednisone 5 mg daily with history of polymyalgia rheumatica and giant cell arteritis. When we've tried tapering off this the past she's had flareups regarding her headaches. She has not had a DEXA scan in quite some time and takes vitamin D and calcium somewhat inconsistently. No recent falls.  Past Medical History  Diagnosis Date  . AODM 11/18/2008  . Carpal tunnel syndrome 08/04/2009  . FASCIITIS, PLANTAR 03/26/2010  . HYPERTENSION, BENIGN SYSTEMIC 10/12/2006  . INCONTINENCE, STRESS, FEMALE 10/12/2006  . Osteoporosis, unspecified 10/12/2006  . RHINITIS, ALLERGIC 10/12/2006  . TEMPORAL ARTERITIS 10/12/2006  . UNSTEADY GAIT 01/22/2007  . Kidney stone   . Inferior MI 8/12   No past surgical history on file.  reports that she has never smoked. She does not have any smokeless tobacco history on file. Her alcohol and drug histories are not on file. family history is not on file. Allergies  Allergen Reactions  . Penicillins Hives  . Sulfadiazine Hives        Review of Systems  Constitutional: Negative for appetite change, fatigue and unexpected weight change.  Eyes: Negative for visual disturbance.  Respiratory: Negative for cough, chest tightness, shortness of breath and wheezing.   Cardiovascular: Negative for chest pain, palpitations and leg swelling.  Endocrine: Negative for polydipsia and polyuria.  Genitourinary: Negative for dysuria.   Musculoskeletal: Positive for arthralgias.  Neurological: Negative for dizziness, seizures, syncope, weakness, light-headedness and headaches.       Objective:   Physical Exam  Constitutional: She appears well-developed and well-nourished.  HENT:  Mouth/Throat: Oropharynx is clear and moist.  Neck: Neck supple. No thyromegaly present.  Cardiovascular: Normal rate and regular rhythm.   Pulmonary/Chest: Effort normal and breath sounds normal. No respiratory distress. She has no wheezes. She has no rales.  Musculoskeletal: She exhibits no edema.          Assessment & Plan:  #1 type 2 diabetes. History of recent suboptimal control. Recheck A1c today #2 hypertension. Stable. Continue losartan #3 hyperlipidemia. Check lipid and hepatic panel #4 health maintenance. Cannot confirm prior Pneumovax. We'll give flu vaccine and Pneumovax today #5 history of temporal arteritis. Chronic low-dose prednisone use. Schedule DEXA scan.

## 2013-05-02 ENCOUNTER — Other Ambulatory Visit: Payer: Self-pay

## 2013-05-02 MED ORDER — METFORMIN HCL 500 MG PO TABS: 500.0000 mg | ORAL_TABLET | Freq: Every day | ORAL | Status: DC

## 2013-05-03 ENCOUNTER — Ambulatory Visit (INDEPENDENT_AMBULATORY_CARE_PROVIDER_SITE_OTHER)
Admission: RE | Admit: 2013-05-03 | Discharge: 2013-05-03 | Disposition: A | Discharge status: Home / Self Care | Payer: Medicare Other | Source: Ambulatory Visit | Attending: Family Medicine | Admitting: Family Medicine

## 2013-05-03 DIAGNOSIS — M810 Age-related osteoporosis without current pathological fracture: Secondary | ICD-10-CM | POA: Diagnosis not present

## 2013-05-04 ENCOUNTER — Other Ambulatory Visit: Payer: Self-pay | Admitting: Family Medicine

## 2013-05-13 DIAGNOSIS — I2510 Atherosclerotic heart disease of native coronary artery without angina pectoris: Secondary | ICD-10-CM | POA: Diagnosis not present

## 2013-05-13 DIAGNOSIS — Q245 Malformation of coronary vessels: Secondary | ICD-10-CM | POA: Diagnosis not present

## 2013-05-13 DIAGNOSIS — I10 Essential (primary) hypertension: Secondary | ICD-10-CM | POA: Diagnosis not present

## 2013-05-13 DIAGNOSIS — I2119 ST elevation (STEMI) myocardial infarction involving other coronary artery of inferior wall: Secondary | ICD-10-CM | POA: Diagnosis not present

## 2013-05-29 DIAGNOSIS — I781 Nevus, non-neoplastic: Secondary | ICD-10-CM | POA: Diagnosis not present

## 2013-05-29 DIAGNOSIS — Z961 Presence of intraocular lens: Secondary | ICD-10-CM | POA: Diagnosis not present

## 2013-05-30 ENCOUNTER — Telehealth: Payer: Self-pay | Admitting: Family Medicine

## 2013-05-30 NOTE — Telephone Encounter (Signed)
lmom for  Pt to call and make appt

## 2013-05-30 NOTE — Telephone Encounter (Signed)
Pt has vertigo and states she has stumbled for a few days now. Need fri appt. Only SD. pls advise.

## 2013-05-30 NOTE — Telephone Encounter (Signed)
Fine to use.  ?

## 2013-05-30 NOTE — Telephone Encounter (Signed)
appt made

## 2013-05-31 ENCOUNTER — Ambulatory Visit (INDEPENDENT_AMBULATORY_CARE_PROVIDER_SITE_OTHER): Payer: Medicare Other | Admitting: Family Medicine

## 2013-05-31 ENCOUNTER — Encounter: Payer: Self-pay | Admitting: Family Medicine

## 2013-05-31 VITALS — BP 128/66 | HR 74 | Temp 97.4°F | Wt 142.0 lb

## 2013-05-31 DIAGNOSIS — R42 Dizziness and giddiness: Secondary | ICD-10-CM | POA: Diagnosis not present

## 2013-05-31 DIAGNOSIS — Z23 Encounter for immunization: Secondary | ICD-10-CM

## 2013-05-31 NOTE — Patient Instructions (Signed)
Vertigo Vertigo means you feel like you or your surroundings are moving when they are not. Vertigo can be dangerous if it occurs when you are at work, driving, or performing difficult activities.  CAUSES  Vertigo occurs when there is a conflict of signals sent to your brain from the visual and sensory systems in your body. There are many different causes of vertigo, including:  Infections, especially in the inner ear.  A bad reaction to a drug or misuse of alcohol and medicines.  Withdrawal from drugs or alcohol.  Rapidly changing positions, such as lying down or rolling over in bed.  A migraine headache.  Decreased blood flow to the brain.  Increased pressure in the brain from a head injury, infection, tumor, or bleeding. SYMPTOMS  You may feel as though the world is spinning around or you are falling to the ground. Because your balance is upset, vertigo can cause nausea and vomiting. You may have involuntary eye movements (nystagmus). DIAGNOSIS  Vertigo is usually diagnosed by physical exam. If the cause of your vertigo is unknown, your caregiver may perform imaging tests, such as an MRI scan (magnetic resonance imaging). TREATMENT  Most cases of vertigo resolve on their own, without treatment. Depending on the cause, your caregiver may prescribe certain medicines. If your vertigo is related to body position issues, your caregiver may recommend movements or procedures to correct the problem. In rare cases, if your vertigo is caused by certain inner ear problems, you may need surgery. HOME CARE INSTRUCTIONS   Follow your caregiver's instructions.  Avoid driving.  Avoid operating heavy machinery.  Avoid performing any tasks that would be dangerous to you or others during a vertigo episode.  Tell your caregiver if you notice that certain medicines seem to be causing your vertigo. Some of the medicines used to treat vertigo episodes can actually make them worse in some people. SEEK  IMMEDIATE MEDICAL CARE IF:   Your medicines do not relieve your vertigo or are making it worse.  You develop problems with talking, walking, weakness, or using your arms, hands, or legs.  You develop severe headaches.  Your nausea or vomiting continues or gets worse.  You develop visual changes.  A family member notices behavioral changes.  Your condition gets worse. MAKE SURE YOU:  Understand these instructions.  Will watch your condition.  Will get help right away if you are not doing well or get worse. Document Released: 05/11/2005 Document Revised: 10/24/2011 Document Reviewed: 02/17/2011 [employer] Patient Information 2014 [employer].  

## 2013-05-31 NOTE — Progress Notes (Signed)
  Subjective:    Patient ID: [name], female    DOB: [birth year], 79 y.o.   MRN: [id number]  HPI Recurrent vertigo symptoms She's had similar symptoms in the past. Symptoms tend to be worse first in the morning when she gets out of bed. Generally improved throughout the day. She is describing vertigo and not syncope or presyncope. No orthostasis. has chronic bilateral hearing loss which is unchanged. No recent falls. She has a walker but does not use this consistently. Denies any speech changes or swallowing difficulties. No focal weakness.  Denies any chest pains or dyspnea. She has type 2 diabetes with recent poor control. We recently added metformin low dosage and she is tolerating without side effects.  Past Medical History  Diagnosis Date  . AODM 11/18/2008  . Carpal tunnel syndrome 08/04/2009  . FASCIITIS, PLANTAR 03/26/2010  . HYPERTENSION, BENIGN SYSTEMIC 10/12/2006  . INCONTINENCE, STRESS, FEMALE 10/12/2006  . Osteoporosis, unspecified 10/12/2006  . RHINITIS, ALLERGIC 10/12/2006  . TEMPORAL ARTERITIS 10/12/2006  . UNSTEADY GAIT 01/22/2007  . Kidney stone   . Inferior MI 8/12   No past surgical history on file.  reports that she has never smoked. She does not have any smokeless tobacco history on file. Her alcohol and drug histories are not on file. family history is not on file. Allergies  Allergen Reactions  . Penicillins Hives  . Sulfadiazine Hives      Review of Systems  Constitutional: Negative for fever, chills, appetite change and unexpected weight change.  Respiratory: Negative for shortness of breath.   Cardiovascular: Negative for chest pain.  Neurological: Positive for dizziness. Negative for seizures, syncope and weakness.  Hematological: Negative for adenopathy.  Psychiatric/Behavioral: Negative for confusion.       Objective:   Physical Exam  Constitutional: She is oriented to person, place, and time. She appears well-developed and well-nourished.   Neck: Neck supple. No thyromegaly present.  Cardiovascular: Normal rate and regular rhythm.   Pulmonary/Chest: Effort normal and breath sounds normal. No respiratory distress. She has no wheezes. She has no rales.  Neurological: She is alert and oriented to person, place, and time. No cranial nerve deficit.  Romberg is negative. No focal weakness. Gait is normal          Assessment & Plan:  Intermittent vertigo. Suspect benign positional vertigo. We've recommended vestibular rehabilitation. She is concerned about transportation. Will try to find a place close to her home. She is encouraged to use walker at all times

## 2013-06-04 ENCOUNTER — Telehealth: Payer: Self-pay | Admitting: Family Medicine

## 2013-06-04 MED ORDER — ATORVASTATIN CALCIUM 20 MG PO TABS: 20.0000 mg | ORAL_TABLET | Freq: Every day | ORAL | Status: DC

## 2013-06-04 NOTE — Telephone Encounter (Signed)
Pt states she is having issues getting her atorvastatin (LIPITOR) 20 MG tablet filled. Can you call and concern her refill is on the way? Pt states she has only 2 pills left and will be out. Pt states if she cannot get in 2 days, she will need to p/u rx at local pharm. pls advise.

## 2013-06-04 NOTE — Telephone Encounter (Signed)
[name] called pt to schedule physical therapy but pt wanted to wait till she felt better and to call back in 1 week.  [name] will call her back on 06/11/13 to see what the pt wants to do.  Thanks a bunch  [employer]

## 2013-06-04 NOTE — Telephone Encounter (Signed)
RX sent to pharmacy and patient is aware

## 2013-06-11 ENCOUNTER — Telehealth: Payer: Self-pay | Admitting: Family Medicine

## 2013-06-11 NOTE — Telephone Encounter (Signed)
Patient stated that she is feeling and doing fine, she is going out and raking leaves in the yard. Pt stated that she feels like she does not need any therapy right now. She feels it is her medication that may be causing her to get dizzy in the morning and its the Metformin medication.

## 2013-06-11 NOTE — Telephone Encounter (Signed)
Whitney from [facility] called to inform us that pt does not want to begin PT just yet, please see note attached to referral.  Also, Whitney suggested that a follow up call be made to the patient, per Whitney, the patient was confused about what her referral was for and also said that she was "waiting for her doctor to call" her before she went to any other appointments.  Alphonzo Lemmings is holding her script for her vertigo medication right now.  Please advise.

## 2013-06-16 NOTE — Telephone Encounter (Signed)
Metformin does not typically cause this side effect.

## 2013-06-17 NOTE — Telephone Encounter (Signed)
Pt called and would like to know if you think the therapy is absolutely necessary. Pt has limited money.

## 2013-06-24 ENCOUNTER — Other Ambulatory Visit: Payer: Self-pay | Admitting: Family Medicine

## 2013-07-31 ENCOUNTER — Encounter: Payer: Self-pay | Admitting: Family Medicine

## 2013-07-31 ENCOUNTER — Ambulatory Visit (INDEPENDENT_AMBULATORY_CARE_PROVIDER_SITE_OTHER): Payer: Medicare Other | Admitting: Family Medicine

## 2013-07-31 ENCOUNTER — Other Ambulatory Visit: Payer: Self-pay

## 2013-07-31 VITALS — BP 130/68 | HR 68 | Temp 98.0°F | Wt 141.0 lb

## 2013-07-31 DIAGNOSIS — E119 Type 2 diabetes mellitus without complications: Secondary | ICD-10-CM | POA: Diagnosis not present

## 2013-07-31 DIAGNOSIS — I10 Essential (primary) hypertension: Secondary | ICD-10-CM

## 2013-07-31 DIAGNOSIS — M353 Polymyalgia rheumatica: Secondary | ICD-10-CM

## 2013-07-31 DIAGNOSIS — E1165 Type 2 diabetes mellitus with hyperglycemia: Secondary | ICD-10-CM

## 2013-07-31 LAB — HEMOGLOBIN A1C: Hgb A1c MFr Bld: 7.8 % — ABNORMAL HIGH (ref 4.6–6.5)

## 2013-07-31 MED ORDER — METFORMIN HCL 500 MG PO TABS: 500.0000 mg | ORAL_TABLET | Freq: Every day | ORAL | Status: DC

## 2013-07-31 MED ORDER — METHOTREXATE 2.5 MG PO TABS: ORAL_TABLET | ORAL | Status: DC

## 2013-07-31 MED ORDER — ATORVASTATIN CALCIUM 20 MG PO TABS: 20.0000 mg | ORAL_TABLET | Freq: Every day | ORAL | Status: DC

## 2013-07-31 MED ORDER — GLIMEPIRIDE 4 MG PO TABS: ORAL_TABLET | ORAL | Status: DC

## 2013-07-31 NOTE — Progress Notes (Signed)
Pre visit review using our clinic review tool, if applicable. No additional management support is needed unless otherwise documented below in the visit note. 

## 2013-07-31 NOTE — Progress Notes (Signed)
   Subjective:    Patient ID: [name], female    DOB: 1923-02-10, 79 y.o.   MRN: [id number]  HPI Patient's chronic problems include history of CAD, type 2 diabetes, hypertension, osteoporosis, history of giant cell arteritis, polymyalgia rheumatica. She has been maintained on low-dose prednisone 5 mg daily and methotrexate 2 weekly. We've tried several times tapering off this low-dose of prednisone but she's had increased headache and increasing sedimentation rate each time.  She's had some chronic vertigo and we had recommend vestibular rehabilitation but she never went.  Type 2 diabetes with recent worsening control. Recent A1c 9.1%. She has normal renal function. We added back metformin 500 mg once daily. She feels somewhat better since then. Not monitoring blood sugars. No symptoms of hyperglycemia.  Past Medical History  Diagnosis Date  . AODM 11/18/2008  . Carpal tunnel syndrome 08/04/2009  . FASCIITIS, PLANTAR 03/26/2010  . HYPERTENSION, BENIGN SYSTEMIC 10/12/2006  . INCONTINENCE, STRESS, FEMALE 10/12/2006  . Osteoporosis, unspecified 10/12/2006  . RHINITIS, ALLERGIC 10/12/2006  . TEMPORAL ARTERITIS 10/12/2006  . UNSTEADY GAIT 01/22/2007  . Kidney stone   . Inferior MI 8/12   No past surgical history on file.  reports that she has never smoked. She does not have any smokeless tobacco history on file. Her alcohol and drug histories are not on file. family history is not on file. Allergies  Allergen Reactions  . Penicillins Hives  . Sulfadiazine Hives      Review of Systems  Constitutional: Positive for fatigue.  Eyes: Negative for visual disturbance.  Respiratory: Negative for cough, chest tightness, shortness of breath and wheezing.   Cardiovascular: Negative for chest pain, palpitations and leg swelling.  Endocrine: Negative for polydipsia and polyuria.  Neurological: Negative for seizures, syncope, weakness and light-headedness.       Objective:   Physical Exam    Constitutional: She is oriented to person, place, and time. She appears well-developed and well-nourished.  Neck: Neck supple. No thyromegaly present.  Cardiovascular: Normal rate.   Pulmonary/Chest: Effort normal and breath sounds normal. No respiratory distress. She has no wheezes. She has no rales.  Neurological: She is alert and oriented to person, place, and time. No cranial nerve deficit.          Assessment & Plan:  #1 type 2 diabetes. History of poor control recently. Recent addition of metformin. Recheck A1c. #2 history of temporal arteritis/polymyalgia. Repeat sed rate. If stable, we'll again try tapering her off her low-dose prednisone

## 2013-09-02 ENCOUNTER — Telehealth: Payer: Self-pay | Admitting: Family Medicine

## 2013-09-02 NOTE — Telephone Encounter (Signed)
Informed pt that all her RX's are sent #90 supply

## 2013-09-02 NOTE — Telephone Encounter (Signed)
Pt would like to ask about why her meds have been switched to [facility]. Pt states her meds [facility] too much at [facility]. Pt states one med was refilled and she double dosed about a week b/c it was automatically refilled.  Pt doesn't remember what med it was. Pt only got a 30 day from [facility] and it cost as much as a 90 day. Pt asked if Dr Elease Hashimoto would call her It appears pt is having problem managing her meds.

## 2013-10-25 ENCOUNTER — Encounter: Payer: Self-pay | Admitting: Family Medicine

## 2013-10-25 ENCOUNTER — Ambulatory Visit (INDEPENDENT_AMBULATORY_CARE_PROVIDER_SITE_OTHER): Payer: Medicare Other | Admitting: Family Medicine

## 2013-10-25 VITALS — BP 130/68 | HR 70 | Wt 138.0 lb

## 2013-10-25 DIAGNOSIS — I10 Essential (primary) hypertension: Secondary | ICD-10-CM | POA: Diagnosis not present

## 2013-10-25 DIAGNOSIS — E1165 Type 2 diabetes mellitus with hyperglycemia: Secondary | ICD-10-CM

## 2013-10-25 DIAGNOSIS — M316 Other giant cell arteritis: Secondary | ICD-10-CM | POA: Diagnosis not present

## 2013-10-25 DIAGNOSIS — E119 Type 2 diabetes mellitus without complications: Secondary | ICD-10-CM

## 2013-10-25 LAB — SEDIMENTATION RATE: Sed Rate: 10 mm/hr (ref 0–22)

## 2013-10-25 LAB — HEMOGLOBIN A1C: Hgb A1c MFr Bld: 8.1 % — ABNORMAL HIGH (ref 4.6–6.5)

## 2013-10-25 NOTE — Progress Notes (Signed)
Pre visit review using our clinic review tool, if applicable. No additional management support is needed unless otherwise documented below in the visit note. 

## 2013-10-25 NOTE — Progress Notes (Signed)
   Subjective:    Patient ID: [name], female    DOB: 1923-02-01, 80 y.o.   MRN: [id number]  HPI Medical followup - type 2 diabetes, history of CAD, hypertension, osteoporosis, temporal arteritis. She's been maintained on low-dose prednisone. Last A1c 7.8%. Not monitoring blood sugars. She does remain on metformin but has had normal renal function and no problems is dullness for quite some time. She takes Amaryl also. No recent hypoglycemia.  Denies any recent falls. No chest pains. No dizziness. Has been more sedentary this winter. No orthostasis.  Past Medical History  Diagnosis Date  . AODM 11/18/2008  . Carpal tunnel syndrome 08/04/2009  . Stryker, [place] 03/26/2010  . HYPERTENSION, BENIGN SYSTEMIC 10/12/2006  . INCONTINENCE, STRESS, FEMALE 10/12/2006  . Osteoporosis, unspecified 10/12/2006  . RHINITIS, ALLERGIC 10/12/2006  . TEMPORAL ARTERITIS 10/12/2006  . UNSTEADY GAIT 01/22/2007  . Kidney stone   . Inferior MI 8/12   No past surgical history on file.  reports that she has never smoked. She does not have any smokeless tobacco history on file. Her alcohol and drug histories are not on file. family history is not on file. Allergies  Allergen Reactions  . Penicillins Hives  . Sulfadiazine Hives      Review of Systems  Constitutional: Positive for fatigue.  Respiratory: Negative for cough, chest tightness, shortness of breath and wheezing.   Cardiovascular: Negative for chest pain, palpitations and leg swelling.  Gastrointestinal: Negative for abdominal pain.  Endocrine: Negative for polydipsia and polyuria.  Genitourinary: Negative for dysuria.  Neurological: Negative for dizziness, seizures, syncope, weakness, light-headedness and headaches.  Hematological: Negative for adenopathy.  Psychiatric/Behavioral: Negative for dysphoric mood.       Objective:   Physical Exam  Constitutional: She is oriented to person, place, and time. She appears well-developed and  well-nourished.  HENT:  Mouth/Throat: Oropharynx is clear and moist.  Neck: Neck supple. No thyromegaly present.  Cardiovascular: Normal rate.  Exam reveals no gallop.   Pulmonary/Chest: Effort normal and breath sounds normal. No respiratory distress. She has no wheezes. She has no rales.  Musculoskeletal: She exhibits no edema.  Neurological: She is alert and oriented to person, place, and time.          Assessment & Plan:  #1 type 2 diabetes. Repeat A1c. She's not monitoring blood sugars at home. We are not aiming for extremely tight control given her age and fact that she lives alone #2 hypertension. Stable. Continue current medications #3 history of temporal arteritis. We tried multiple times tapering her completely off prednisone but she's had recurrence of headaches. Repeat sedimentation rate today.

## 2013-10-28 ENCOUNTER — Telehealth: Payer: Self-pay | Admitting: Family Medicine

## 2013-10-28 NOTE — Telephone Encounter (Signed)
Relevant patient education mailed to patient.  

## 2013-10-31 ENCOUNTER — Ambulatory Visit: Payer: Medicare Other | Admitting: Family Medicine

## 2013-10-31 DIAGNOSIS — R21 Rash and other nonspecific skin eruption: Secondary | ICD-10-CM | POA: Diagnosis not present

## 2013-10-31 DIAGNOSIS — Z7689 Persons encountering health services in other specified circumstances: Secondary | ICD-10-CM | POA: Diagnosis not present

## 2013-11-14 ENCOUNTER — Emergency Department (HOSPITAL_COMMUNITY)
Admission: EM | Admit: 2013-11-14 | Discharge: 2013-11-14 | Disposition: A | Discharge status: Home / Self Care | Payer: Medicare Other | Attending: Emergency Medicine | Admitting: Emergency Medicine

## 2013-11-14 ENCOUNTER — Encounter (HOSPITAL_COMMUNITY): Payer: Self-pay | Admitting: Emergency Medicine

## 2013-11-14 DIAGNOSIS — Z87442 Personal history of urinary calculi: Secondary | ICD-10-CM | POA: Insufficient documentation

## 2013-11-14 DIAGNOSIS — I10 Essential (primary) hypertension: Secondary | ICD-10-CM | POA: Diagnosis not present

## 2013-11-14 DIAGNOSIS — I252 Old myocardial infarction: Secondary | ICD-10-CM | POA: Insufficient documentation

## 2013-11-14 DIAGNOSIS — Z8709 Personal history of other diseases of the respiratory system: Secondary | ICD-10-CM | POA: Diagnosis not present

## 2013-11-14 DIAGNOSIS — W19XXXA Unspecified fall, initial encounter: Secondary | ICD-10-CM

## 2013-11-14 DIAGNOSIS — Z8669 Personal history of other diseases of the nervous system and sense organs: Secondary | ICD-10-CM | POA: Diagnosis not present

## 2013-11-14 DIAGNOSIS — Z79899 Other long term (current) drug therapy: Secondary | ICD-10-CM | POA: Diagnosis not present

## 2013-11-14 DIAGNOSIS — Z880 Allergy status to penicillin: Secondary | ICD-10-CM | POA: Insufficient documentation

## 2013-11-14 DIAGNOSIS — Z7982 Long term (current) use of aspirin: Secondary | ICD-10-CM | POA: Insufficient documentation

## 2013-11-14 DIAGNOSIS — Z8742 Personal history of other diseases of the female genital tract: Secondary | ICD-10-CM | POA: Insufficient documentation

## 2013-11-14 DIAGNOSIS — E119 Type 2 diabetes mellitus without complications: Secondary | ICD-10-CM | POA: Insufficient documentation

## 2013-11-14 DIAGNOSIS — Y929 Unspecified place or not applicable: Secondary | ICD-10-CM | POA: Insufficient documentation

## 2013-11-14 DIAGNOSIS — Y939 Activity, unspecified: Secondary | ICD-10-CM | POA: Insufficient documentation

## 2013-11-14 DIAGNOSIS — M810 Age-related osteoporosis without current pathological fracture: Secondary | ICD-10-CM | POA: Insufficient documentation

## 2013-11-14 DIAGNOSIS — R296 Repeated falls: Secondary | ICD-10-CM | POA: Insufficient documentation

## 2013-11-14 DIAGNOSIS — IMO0002 Reserved for concepts with insufficient information to code with codable children: Secondary | ICD-10-CM | POA: Diagnosis not present

## 2013-11-14 DIAGNOSIS — T148XXA Other injury of unspecified body region, initial encounter: Secondary | ICD-10-CM

## 2013-11-14 LAB — CBC WITH DIFFERENTIAL/PLATELET
Basophils Absolute: 0 10*3/uL (ref 0.0–0.1)
Basophils Relative: 0 % (ref 0–1)
Eosinophils Absolute: 0.1 10*3/uL (ref 0.0–0.7)
Eosinophils Relative: 1 % (ref 0–5)
HCT: 36.7 % (ref 36.0–46.0)
HEMOGLOBIN: 11.9 g/dL — AB (ref 12.0–15.0)
LYMPHS ABS: 2.3 10*3/uL (ref 0.7–4.0)
LYMPHS PCT: 26 % (ref 12–46)
MCH: 32.2 pg (ref 26.0–34.0)
MCHC: 32.4 g/dL (ref 30.0–36.0)
MCV: 99.2 fL (ref 78.0–100.0)
Monocytes Absolute: 0.4 10*3/uL (ref 0.1–1.0)
Monocytes Relative: 5 % (ref 3–12)
Neutro Abs: 6 10*3/uL (ref 1.7–7.7)
Neutrophils Relative %: 68 % (ref 43–77)
PLATELETS: 170 10*3/uL (ref 150–400)
RBC: 3.7 MIL/uL — AB (ref 3.87–5.11)
RDW: 14.2 % (ref 11.5–15.5)
WBC: 8.8 10*3/uL (ref 4.0–10.5)

## 2013-11-14 LAB — URINALYSIS, ROUTINE W REFLEX MICROSCOPIC
Bilirubin Urine: NEGATIVE
Glucose, UA: 250 mg/dL — AB
HGB URINE DIPSTICK: NEGATIVE
Ketones, ur: NEGATIVE mg/dL
Leukocytes, UA: NEGATIVE
Nitrite: NEGATIVE
PH: 5.5 (ref 5.0–8.0)
Protein, ur: NEGATIVE mg/dL
SPECIFIC GRAVITY, URINE: 1.019 (ref 1.005–1.030)
Urobilinogen, UA: 1 mg/dL (ref 0.0–1.0)

## 2013-11-14 LAB — BASIC METABOLIC PANEL
BUN: 18 mg/dL (ref 6–23)
CO2: 27 mEq/L (ref 19–32)
Calcium: 9.9 mg/dL (ref 8.4–10.5)
Chloride: 101 mEq/L (ref 96–112)
Creatinine, Ser: 0.86 mg/dL (ref 0.50–1.10)
GFR calc Af Amer: 68 mL/min — ABNORMAL LOW (ref >90)
GFR calc non Af Amer: 59 mL/min — ABNORMAL LOW (ref >90)
GLUCOSE: 146 mg/dL — AB (ref 70–99)
POTASSIUM: 3.9 meq/L (ref 3.7–5.3)
SODIUM: 140 meq/L (ref 137–147)

## 2013-11-14 NOTE — ED Notes (Signed)
Pt had fall yesterday out walking, pt states she may have gotten dizziness and feel. Now c/o painiright leg with walking. Pt states as long as she is sitting down it doesn't hurt.

## 2013-11-14 NOTE — ED Provider Notes (Signed)
CSN: [id number]     Arrival date & time 11/14/13  1141 History   First MD Initiated Contact with Patient 11/14/13 1251     Chief Complaint  Patient presents with  . Fall     (Consider location/radiation/quality/duration/timing/severity/associated sxs/prior Treatment) Patient is a 80 y.o. female presenting with fall. The history is provided by the patient.  Fall   She reports falling yesterday, when she felt weak and sat down on her buttocks. She did not have pain initially, but this morning, had to use her walker because of left upper leg pain. She did not hit her head. She denies headache, neck pain, back pain current weakness, dizziness, anorexia, nausea, vomiting, chest pain or shortness of breath. She came here by private vehicle, able to ambulate on her own. She has not had any recent illnesses. There are no other known modifying factors.  Past Medical History  Diagnosis Date  . AODM 11/18/2008  . Carpal tunnel syndrome 08/04/2009  . [place] 03/26/2010  . HYPERTENSION, BENIGN SYSTEMIC 10/12/2006  . INCONTINENCE, STRESS, FEMALE 10/12/2006  . Osteoporosis, unspecified 10/12/2006  . RHINITIS, ALLERGIC 10/12/2006  . TEMPORAL ARTERITIS 10/12/2006  . UNSTEADY GAIT 01/22/2007  . Kidney stone   . 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 No pertinent past surgical history. No family history on file. History  Substance Use Topics  . Smoking status: Never Smoker   . Smokeless tobacco: Not on file  . Alcohol Use:    OB History   Grav Para Term Preterm Abortions TAB SAB Ect Mult Living                 Review of Systems  All other systems reviewed and are negative.      Allergies  Penicillins and Sulfadiazine  Home Medications   Current Outpatient Rx  Name  Route  Sig  Dispense  Refill  . aspirin EC 81 MG tablet   Oral   Take 81 mg by mouth daily.           Marland Kitchen atorvastatin (LIPITOR) 20 MG tablet   Oral   Take 1 tablet (20 mg total) by mouth daily.   90 tablet    3   . azelastine (ASTELIN) 137 MCG/SPRAY nasal spray   Nasal   Place 2 sprays into the nose 2 (two) times daily. Use in each nostril as directed   30 mL   3   . [place] Calcium-Magnesium-Vit D [id number] MG-MG-UNIT CAPS   Oral   Take 1 tablet by mouth daily.          Marland Kitchen glimepiride (AMARYL) 4 MG tablet   Oral   Take 4 mg by mouth daily with breakfast.         . losartan (COZAAR) 50 MG tablet   Oral   Take 1 tablet (50 mg total) by mouth daily.   30 tablet   11   . metFORMIN (GLUCOPHAGE) 500 MG tablet   Oral   Take 1 tablet (500 mg total) by mouth daily.   90 tablet   3   . methotrexate (RHEUMATREX) 2.5 MG tablet   Oral   Take 5 mg by mouth once a week. Caution:Chemotherapy. Protect from light. Takes on Tuesday         . metoprolol succinate (TOPROL-XL) 25 MG 24 hr tablet   Oral   Take 25 mg by mouth daily.         . nitroGLYCERIN (NITROSTAT) 0.4 MG SL tablet  Sublingual   Place 0.4 mg under the tongue every 5 (five) minutes as needed.           . predniSONE (DELTASONE) 5 MG tablet   Oral   Take 5 mg by mouth daily with breakfast.         . triamcinolone cream (KENALOG) 0.1 %   Topical   Apply topically as needed.          BP 141/60  Pulse 82  Temp(Src) 98 F (36.7 C) (Oral)  Resp 18  SpO2 99% Physical Exam  Nursing note and vitals reviewed. Constitutional: She is oriented to person, place, and time. She appears well-developed.  Elderly, frail  HENT:  Head: Normocephalic and atraumatic.  Eyes: Conjunctivae and EOM are normal. Pupils are equal, round, and reactive to light.  Neck: Normal range of motion and phonation normal. Neck supple.  Cardiovascular: Normal rate, regular rhythm and intact distal pulses.   Pulmonary/Chest: Effort normal and breath sounds normal. She exhibits no tenderness.  Abdominal: Soft. She exhibits no distension. There is no tenderness. There is no guarding.  Musculoskeletal: Normal range of motion.  Minimal left  medial thigh tenderness, without crepitation or deformity. There is no left hip pain on active or passive range of motion.  Neurological: She is alert and oriented to person, place, and time. She exhibits normal muscle tone.  Normal gait  Skin: Skin is warm and dry.  Psychiatric: She has a normal mood and affect. Her behavior is normal.    ED Course  Procedures (including critical care time) Medications - No data to display  Patient Vitals for the past 24 hrs:  BP Temp Temp src Pulse Resp SpO2  11/14/13 1435 162/68 mmHg - - 74 19 99 %  11/14/13 1153 141/60 mmHg 98 F (36.7 C) Oral 82 18 99 %   Findings discussed with patient, and her daughter; all questions answered.    Labs Review Labs Reviewed  CBC WITH DIFFERENTIAL - Abnormal; Notable for the following:    RBC 3.70 (*)    Hemoglobin 11.9 (*)    All other components within normal limits  BASIC METABOLIC PANEL - Abnormal; Notable for the following:    Glucose, Bld 146 (*)    GFR calc non Af Amer 59 (*)    GFR calc Af Amer 68 (*)    All other components within normal limits  URINALYSIS, ROUTINE W REFLEX MICROSCOPIC - Abnormal; Notable for the following:    Glucose, UA 250 (*)    All other components within normal limits  URINE CULTURE   Imaging Review No results found.   EKG Interpretation None      MDM   Final diagnoses:  Fall  Sprains and strains of joints and adjacent muscles    Fall without serious injury such as fracture or significant muscle strain. I doubt that she has a pelvic fracture or injury. Her injury seems to be confined to the left medial thigh. She is able to angulate. Screening evaluation for medical causes of fall was negative.  Nursing Notes Reviewed/ Care Coordinated Applicable Imaging Reviewed Interpretation of Laboratory Data incorporated into ED treatment  The patient appears reasonably screened and/or stabilized for discharge and I doubt any other medical condition or other [facility]  requiring further screening, evaluation, or treatment in the ED at this time prior to discharge.  Plan: Home Medications- usual; Home Treatments- rest, consider using assistive device for ambulation; return here if the recommended treatment, does not improve  the symptoms; Recommended follow up- PCP 1 week for check up    Richarda Blade, MD 11/14/13 1444

## 2013-11-14 NOTE — Discharge Instructions (Signed)
The tests today, did not reveal anything that contributed to your fall. For pain, take Tylenol. Walk as much as tolerated.  Consider using a cane or walker to make sure that you ambulate safely. CR physician for a checkup, next week  Contusion A contusion is a deep bruise. Contusions are the result of an injury that caused bleeding under the skin. The contusion may turn blue, purple, or yellow. Minor injuries will give you a painless contusion, but more severe contusions may stay painful and swollen for a few weeks.  CAUSES  A contusion is usually caused by a blow, trauma, or direct force to an area of the body. SYMPTOMS   Swelling and redness of the injured area.  Bruising of the injured area.  Tenderness and soreness of the injured area.  Pain. DIAGNOSIS  The diagnosis can be made by taking a history and physical exam. An X-ray, CT scan, or MRI may be needed to determine if there were any associated injuries, such as fractures. TREATMENT  Specific treatment will depend on what area of the body was injured. In general, the best treatment for a contusion is resting, icing, elevating, and applying cold compresses to the injured area. Over-the-counter medicines may also be recommended for pain control. Ask your caregiver what the best treatment is for your contusion. HOME CARE INSTRUCTIONS   Put ice on the injured area.  Put ice in a plastic bag.  Place a towel between your skin and the bag.  Leave the ice on for 15-20 minutes, 03-04 times a day.  Only take over-the-counter or prescription medicines for pain, discomfort, or fever as directed by your caregiver. Your caregiver may recommend avoiding anti-inflammatory medicines (aspirin, ibuprofen, and naproxen) for 48 hours because these medicines may increase bruising.  Rest the injured area.  If possible, elevate the injured area to reduce swelling. SEEK IMMEDIATE MEDICAL CARE IF:   You have increased bruising or  swelling.  You have pain that is getting worse.  Your swelling or pain is not relieved with medicines. MAKE SURE YOU:   Understand these instructions.  Will watch your condition.  Will get help right away if you are not doing well or get worse. Document Released: 05/11/2005 Document Revised: 10/24/2011 Document Reviewed: 06/06/2011 [facility] Patient Information 2014 [place], [place].  Fall Prevention and Home Safety Falls cause injuries and can affect all age groups. It is possible to use preventive measures to significantly decrease the likelihood of falls. There are many simple measures which can make your home safer and prevent falls. OUTDOORS  Repair cracks and edges of walkways and driveways.  Remove high doorway thresholds.  Trim shrubbery on the main path into your home.  Have good outside lighting.  Clear walkways of tools, rocks, debris, and clutter.  Check that handrails are not broken and are securely fastened. Both sides of steps should have handrails.  Have leaves, snow, and ice cleared regularly.  Use sand or salt on walkways during winter months.  In the garage, clean up grease or oil spills. BATHROOM  Install night lights.  Install grab bars by the toilet and in the tub and shower.  Use non-skid mats or decals in the tub or shower.  Place a plastic non-slip stool in the shower to sit on, if needed.  Keep floors dry and clean up all water on the floor immediately.  Remove soap buildup in the tub or shower on a regular basis.  Secure bath mats with non-slip, double-sided rug tape.  Remove throw rugs and tripping hazards from the floors. BEDROOMS  Install night lights.  Make sure a bedside light is easy to reach.  Do not use oversized bedding.  Keep a telephone by your bedside.  Have a firm chair with side arms to use for getting dressed.  Remove throw rugs and tripping hazards from the floor. KITCHEN  Keep handles on pots and pans turned  toward the center of the stove. Use back burners when possible.  Clean up spills quickly and allow time for drying.  Avoid walking on wet floors.  Avoid hot utensils and knives.  Position shelves so they are not too high or low.  Place commonly used objects within easy reach.  If necessary, use a sturdy step stool with a grab bar when reaching.  Keep electrical cables out of the way.  Do not use floor polish or wax that makes floors slippery. If you must use wax, use non-skid floor wax.  Remove throw rugs and tripping hazards from the floor. STAIRWAYS  Never leave objects on stairs.  Place handrails on both sides of stairways and use them. Fix any loose handrails. Make sure handrails on both sides of the stairways are as long as the stairs.  Check carpeting to make sure it is firmly attached along stairs. Make repairs to worn or loose carpet promptly.  Avoid placing throw rugs at the top or bottom of stairways, or properly secure the rug with carpet tape to prevent slippage. Get rid of throw rugs, if possible.  Have an electrician put in a light switch at the top and bottom of the stairs. OTHER FALL PREVENTION TIPS  Wear low-heel or rubber-soled shoes that are supportive and fit well. Wear closed toe shoes.  When using a stepladder, make sure it is fully opened and both spreaders are firmly locked. Do not climb a closed stepladder.  Add color or contrast paint or tape to grab bars and handrails in your home. Place contrasting color strips on first and last steps.  Learn and use mobility aids as needed. Install an electrical emergency response system.  Turn on lights to avoid dark areas. Replace light bulbs that burn out immediately. Get light switches that glow.  Arrange furniture to create clear pathways. Keep furniture in the same place.  Firmly attach carpet with non-skid or double-sided tape.  Eliminate uneven floor surfaces.  Select a carpet pattern that does not  visually hide the edge of steps.  Be aware of all pets. OTHER HOME SAFETY TIPS  Set the water temperature for 120 F (48.8 C).  Keep emergency numbers on or near the telephone.  Keep smoke detectors on every level of the home and near sleeping areas. Document Released: 07/22/2002 Document Revised: 01/31/2012 Document Reviewed: 10/21/2011 [facility] Patient Information 2014 [place].  Muscle Strain A muscle strain (pulled muscle) happens when a muscle is stretched beyond normal length. It happens when a sudden, violent force stretches your muscle too far. Usually, a few of the fibers in your muscle are torn. Muscle strain is common in athletes. Recovery usually takes 1 2 weeks. Complete healing takes 5 6 weeks.  HOME CARE   Follow the PRICE method of treatment to help your injury get better. Do this the first 2 3 days after the injury:  Protect. Protect the muscle to keep it from getting injured again.  Rest. Limit your activity and rest the injured body part.  Ice. Put ice in a plastic bag. Place a towel  between your skin and the bag. Then, apply the ice and leave it on from 15 20 minutes each hour. After the third day, switch to moist heat packs.  Compression. Use a splint or elastic bandage on the injured area for comfort. Do not put it on too tightly.  Elevate. Keep the injured body part above the level of your heart.  Only take medicine as told by your doctor.  Warm up before doing exercise to prevent future muscle strains. GET HELP IF:   You have more pain or puffiness (swelling) in the injured area.  You feel numbness, tingling, or notice a loss of strength in the injured area. MAKE SURE YOU:   Understand these instructions.  Will watch your condition.  Will get help right away if you are not doing well or get worse. Document Released: 05/10/2008 Document Revised: 05/22/2013 Document Reviewed: 02/28/2013 [facility] Patient Information 2014 [place], [place].

## 2013-11-15 LAB — URINE CULTURE

## 2013-11-29 ENCOUNTER — Telehealth: Payer: Self-pay | Admitting: Family Medicine

## 2013-11-29 NOTE — Telephone Encounter (Signed)
Pt fell 2 weeks ago. Went to [facility]. Pt states her leg is sill giving her problems, hurts to walk. They did not do an xray. . Pt needs appt in the am b/c someone has to bring her. Only SD appts ; pls advise. Pt also dizzy at times. States that is probably why she fell.

## 2013-11-29 NOTE — Telephone Encounter (Signed)
That's fine to use a SD slot.

## 2013-12-03 ENCOUNTER — Encounter: Payer: Self-pay | Admitting: Family Medicine

## 2013-12-03 ENCOUNTER — Ambulatory Visit (INDEPENDENT_AMBULATORY_CARE_PROVIDER_SITE_OTHER): Payer: Medicare Other | Admitting: Family Medicine

## 2013-12-03 VITALS — BP 136/72 | HR 69 | Wt 140.0 lb

## 2013-12-03 DIAGNOSIS — R300 Dysuria: Secondary | ICD-10-CM | POA: Diagnosis not present

## 2013-12-03 DIAGNOSIS — Z9181 History of falling: Secondary | ICD-10-CM | POA: Diagnosis not present

## 2013-12-03 DIAGNOSIS — R42 Dizziness and giddiness: Secondary | ICD-10-CM | POA: Diagnosis not present

## 2013-12-03 LAB — POCT URINALYSIS DIPSTICK
Bilirubin, UA: NEGATIVE
Blood, UA: NEGATIVE
Glucose, UA: NEGATIVE
Ketones, UA: NEGATIVE
Leukocytes, UA: NEGATIVE
Nitrite, UA: NEGATIVE
Protein, UA: NEGATIVE
Spec Grav, UA: <=1.005
Urobilinogen, UA: 0.2
pH, UA: 6

## 2013-12-03 MED ORDER — GLIMEPIRIDE 4 MG PO TABS: 4.0000 mg | ORAL_TABLET | Freq: Every day | ORAL | Status: DC

## 2013-12-03 MED ORDER — METFORMIN HCL 500 MG PO TABS: 500.0000 mg | ORAL_TABLET | Freq: Every day | ORAL | Status: DC

## 2013-12-03 NOTE — Telephone Encounter (Signed)
done

## 2013-12-03 NOTE — Patient Instructions (Addendum)
Dr. Bing Plume for eye appointment.  Follow up with ENT doctor for vertigo   Vertigo Vertigo means you feel like you or your surroundings are moving when they are not. Vertigo can be dangerous if it occurs when you are at work, driving, or performing difficult activities.  CAUSES  Vertigo occurs when there is a conflict of signals sent to your brain from the visual and sensory systems in your body. There are many different causes of vertigo, including:  Infections, especially in the inner ear.  A bad reaction to a drug or misuse of alcohol and medicines.  Withdrawal from drugs or alcohol.  Rapidly changing positions, such as lying down or rolling over in bed.  A migraine headache.  Decreased blood flow to the brain.  Increased pressure in the brain from a head injury, infection, tumor, or bleeding. SYMPTOMS  You may feel as though the world is spinning around or you are falling to the ground. Because your balance is upset, vertigo can cause nausea and vomiting. You may have involuntary eye movements (nystagmus). DIAGNOSIS  Vertigo is usually diagnosed by physical exam. If the cause of your vertigo is unknown, your caregiver may perform imaging tests, such as an MRI scan (magnetic resonance imaging). TREATMENT  Most cases of vertigo resolve on their own, without treatment. Depending on the cause, your caregiver may prescribe certain medicines. If your vertigo is related to body position issues, your caregiver may recommend movements or procedures to correct the problem. In rare cases, if your vertigo is caused by certain inner ear problems, you may need surgery. HOME CARE INSTRUCTIONS   Follow your caregiver's instructions.  Avoid driving.  Avoid operating heavy machinery.  Avoid performing any tasks that would be dangerous to you or others during a vertigo episode.  Tell your caregiver if you notice that certain medicines seem to be causing your vertigo. Some of the medicines used to  treat vertigo episodes can actually make them worse in some people. SEEK IMMEDIATE MEDICAL CARE IF:   Your medicines do not relieve your vertigo or are making it worse.  You develop problems with talking, walking, weakness, or using your arms, hands, or legs.  You develop severe headaches.  Your nausea or vomiting continues or gets worse.  You develop visual changes.  A family member notices behavioral changes.  Your condition gets worse. MAKE SURE YOU:  Understand these instructions.  Will watch your condition.  Will get help right away if you are not doing well or get worse. Document Released: 05/11/2005 Document Revised: 10/24/2011 Document Reviewed: 02/17/2011 [facility] Patient Information 2014 [place].   Fall Prevention and Home Safety Falls cause injuries and can affect all age groups. It is possible to use preventive measures to significantly decrease the likelihood of falls. There are many simple measures which can make your home safer and prevent falls. OUTDOORS  Repair cracks and edges of walkways and driveways.  Remove high doorway thresholds.  Trim shrubbery on the main path into your home.  Have good outside lighting.  Clear walkways of tools, rocks, debris, and clutter.  Check that handrails are not broken and are securely fastened. Both sides of steps should have handrails.  Have leaves, snow, and ice cleared regularly.  Use sand or salt on walkways during winter months.  In the garage, clean up grease or oil spills. BATHROOM  Install night lights.  Install grab bars by the toilet and in the tub and shower.  Use non-skid mats or decals  in the tub or shower.  Place a plastic non-slip stool in the shower to sit on, if needed.  Keep floors dry and clean up all water on the floor immediately.  Remove soap buildup in the tub or shower on a regular basis.  Secure bath mats with non-slip, double-sided rug tape.  Remove throw rugs and  tripping hazards from the floors. BEDROOMS  Install night lights.  Make sure a bedside light is easy to reach.  Do not use oversized bedding.  Keep a telephone by your bedside.  Have a firm chair with side arms to use for getting dressed.  Remove throw rugs and tripping hazards from the floor. KITCHEN  Keep handles on pots and pans turned toward the center of the stove. Use back burners when possible.  Clean up spills quickly and allow time for drying.  Avoid walking on wet floors.  Avoid hot utensils and knives.  Position shelves so they are not too high or low.  Place commonly used objects within easy reach.  If necessary, use a sturdy step stool with a grab bar when reaching.  Keep electrical cables out of the way.  Do not use floor polish or wax that makes floors slippery. If you must use wax, use non-skid floor wax.  Remove throw rugs and tripping hazards from the floor. STAIRWAYS  Never leave objects on stairs.  Place handrails on both sides of stairways and use them. Fix any loose handrails. Make sure handrails on both sides of the stairways are as long as the stairs.  Check carpeting to make sure it is firmly attached along stairs. Make repairs to worn or loose carpet promptly.  Avoid placing throw rugs at the top or bottom of stairways, or properly secure the rug with carpet tape to prevent slippage. Get rid of throw rugs, if possible.  Have an electrician put in a light switch at the top and bottom of the stairs. OTHER FALL PREVENTION TIPS  Wear low-heel or rubber-soled shoes that are supportive and fit well. Wear closed toe shoes.  When using a stepladder, make sure it is fully opened and both spreaders are firmly locked. Do not climb a closed stepladder.  Add color or contrast paint or tape to grab bars and handrails in your home. Place contrasting color strips on first and last steps.  Learn and use mobility aids as needed. Install an electrical  emergency response system.  Turn on lights to avoid dark areas. Replace light bulbs that burn out immediately. Get light switches that glow.  Arrange furniture to create clear pathways. Keep furniture in the same place.  Firmly attach carpet with non-skid or double-sided tape.  Eliminate uneven floor surfaces.  Select a carpet pattern that does not visually hide the edge of steps.  Be aware of all pets. OTHER HOME SAFETY TIPS  Set the water temperature for 120 F (48.8 C).  Keep emergency numbers on or near the telephone.  Keep smoke detectors on every level of the home and near sleeping areas. Document Released: 07/22/2002 Document Revised: 01/31/2012 Document Reviewed: 10/21/2011 [facility] Patient Information 2014 [place].

## 2013-12-03 NOTE — Progress Notes (Signed)
Pre visit review using our clinic review tool, if applicable. No additional management support is needed unless otherwise documented below in the visit note. 

## 2013-12-03 NOTE — Progress Notes (Signed)
   Subjective:    Patient ID: [name], female    DOB: 11/25/1922, 80 y.o.   MRN: [id number]  HPI Comments: Patient is a 80 year-old female presenting for ED follow up appointment.  Patient fell in the yard last week. Seen in ED for left leg pain. Resting made the pain better. She is not in any pain at this time. Able to ambulate without assistance.   Felt dizzy for the past week. Feels like head is spinning when she sits up in the bed or gets out of bed too fast. Looking up exacerbates the sensation. Has not tried any other medications but continues to take current medications as prescribed.   Experiencing urinary frequency and urgency for the past week with dysuria. Burning sensation while urinating. No confusion or disorientation.  DM Last ophthalmologist appointment was last year. Needs a new doctor.  Has never been seen by a podiatrist.      Review of Systems  Constitutional: Negative for fever, appetite change and fatigue.  HENT: Positive for ear pain.   Respiratory: Negative for shortness of breath.   Cardiovascular: Negative for chest pain and palpitations.  Gastrointestinal: Positive for constipation. Negative for nausea, vomiting, abdominal pain, diarrhea and blood in stool.  Endocrine: Negative for polydipsia, polyphagia and polyuria.  Genitourinary: Positive for dysuria, urgency and frequency. Negative for hematuria.       Wakes up 3-4 times a night to urinate. Was told she only has 1 kidney.   Neurological: Positive for dizziness. Negative for seizures, syncope, weakness and headaches.  Psychiatric/Behavioral: Negative for confusion.       Objective:   Physical Exam  Constitutional: She appears well-developed and well-nourished.  HENT:  Head: Normocephalic.  Mouth/Throat: Uvula is midline, oropharynx is clear and moist and mucous membranes are normal.  Unable to visualize canal or TM because of bilateral hearing aids.   Eyes: Conjunctivae and EOM are normal.    Cardiovascular: Normal rate, normal heart sounds and intact distal pulses.   No carotid bruits bilaterally.   Pulmonary/Chest: Effort normal and breath sounds normal.  Abdominal: Soft. There is no tenderness.  Musculoskeletal: Normal range of motion.  Lymphadenopathy:    She has no cervical adenopathy.  Neurological: She is alert. She has normal strength. No cranial nerve deficit. Coordination and gait normal.  Foot exam demonstrates no monofilament impairment.   Skin: Skin is warm and dry.          Assessment & Plan:  1. Dizziness Vertigo is the most likely cause. Follow-up with ENT. [facility] in [place] recommended to prevent fall risk. 2. Dysuria Urinalysis is clear. Probably age related. Will not add any medicated at this time.  3. Health Maintenance Patient advised to schedule an appointment with Dr. Bing Plume for eye exam.  Audelia Acton, PA-S   As above.  Pt had recent fall with ED follow up and no x-rays done. She does not have any current pain with ambulation.  UA does not suggest UTI. She has had chronic vertigo in past (suspect BPPV) and we have recommended PT to reduce fall risk.  She did not go in past when we made this referral.  Carolann Littler, MD

## 2013-12-19 ENCOUNTER — Telehealth: Payer: Self-pay | Admitting: Family Medicine

## 2013-12-19 NOTE — Telephone Encounter (Signed)
Ok to set up 

## 2013-12-19 NOTE — Telephone Encounter (Signed)
Pt stated that she does not have a ENT doctor. Is it okay to do a referral for the patient.

## 2013-12-19 NOTE — Telephone Encounter (Signed)
On pt's AVS summary, it states pt needs fup w/ ENT. Pt has not heard concerning this appt. Pt will needs referral to this doc. Pt not sure if she was to go there before Eye doc. Eye doc appt not until June. Pt also confused about when to take her meds and would like nurse to go over this with her. Pt states she still has the ear pain and is looking forward to an ENT visit.

## 2013-12-20 ENCOUNTER — Other Ambulatory Visit: Payer: Self-pay | Admitting: Family Medicine

## 2013-12-20 DIAGNOSIS — H9209 Otalgia, unspecified ear: Secondary | ICD-10-CM

## 2013-12-20 NOTE — Telephone Encounter (Signed)
Referral is ordered

## 2013-12-25 DIAGNOSIS — H9190 Unspecified hearing loss, unspecified ear: Secondary | ICD-10-CM | POA: Diagnosis not present

## 2013-12-25 DIAGNOSIS — H9209 Otalgia, unspecified ear: Secondary | ICD-10-CM | POA: Diagnosis not present

## 2013-12-25 DIAGNOSIS — R42 Dizziness and giddiness: Secondary | ICD-10-CM | POA: Diagnosis not present

## 2013-12-26 ENCOUNTER — Telehealth: Payer: Self-pay | Admitting: Family Medicine

## 2013-12-26 NOTE — Telephone Encounter (Signed)
Pt would like to know if she is supposed to be on predniSONE (DELTASONE) 5 MG tablet.  Pt has been out of this med a week and not sure she is supposed to take or not.

## 2013-12-27 MED ORDER — PREDNISONE 5 MG PO TABS: 5.0000 mg | ORAL_TABLET | Freq: Every day | ORAL | Status: DC

## 2013-12-27 NOTE — Telephone Encounter (Signed)
Pt is at home. Ok to call now.

## 2013-12-27 NOTE — Telephone Encounter (Signed)
Spoke to pt told her Dr. Elease Hashimoto and going to restart Prednisone 5 mg daily and will discuss at next visit about getting off medication. Rx sent to pharmacy. Pt verbalized understanding.

## 2013-12-27 NOTE — Telephone Encounter (Signed)
Left message for patient to return call.

## 2013-12-27 NOTE — Telephone Encounter (Signed)
Spoke to pt asked her if she is having any headaches, body aches or visual changes since off Prednisone. Pt stated I have pain all the time legs, shoulders no change. I went to the ear doctor for my ear. Asked pt again any headaches? Pt state no. Told pt I will call her right back after I talk to Dr. Elease Hashimoto whether she should go back on Prednisone. Pt verbalized understanding.  Discussed with Dr. Elease Hashimoto, told him pt is not understanding me if any changes since off Prednisone. Told him pt states hurst all over. Dr. Elease Hashimoto said have pt go back on Prednisone 5 mg daily and will discuss at next visit about tapering off.

## 2013-12-27 NOTE — Telephone Encounter (Signed)
If she has had no major headaches or body aches or visual changes OK to leave off.  Would normally taper off even 5 mg dose but if she has already been off for one week with no adverse effect probably OK to leave off.

## 2014-01-08 ENCOUNTER — Other Ambulatory Visit: Payer: Self-pay | Admitting: Family Medicine

## 2014-01-09 ENCOUNTER — Encounter: Payer: Self-pay | Admitting: Family Medicine

## 2014-01-09 ENCOUNTER — Ambulatory Visit (INDEPENDENT_AMBULATORY_CARE_PROVIDER_SITE_OTHER): Payer: Medicare Other | Admitting: Family Medicine

## 2014-01-09 VITALS — BP 130/66 | HR 76 | Temp 97.7°F | Wt 139.0 lb

## 2014-01-09 DIAGNOSIS — H9209 Otalgia, unspecified ear: Secondary | ICD-10-CM | POA: Diagnosis not present

## 2014-01-09 DIAGNOSIS — H9201 Otalgia, right ear: Secondary | ICD-10-CM

## 2014-01-09 NOTE — Patient Instructions (Signed)
Increase Astelin to twice daily and consider adding OTC Claritan or Zyrtec daily for allergy symptoms.

## 2014-01-09 NOTE — Progress Notes (Signed)
Pre visit review using our clinic review tool, if applicable. No additional management support is needed unless otherwise documented below in the visit note. 

## 2014-01-09 NOTE — Progress Notes (Signed)
   Subjective:    Patient ID: [name], female    DOB: 1922/10/18, 80 y.o.   MRN: [id number]  Otalgia  Pertinent negatives include no coughing, headaches or sore throat.  Oral Pain  Pertinent negatives include no fever or sinus pressure.    Patient with complaints of right earache. Very poorly localized. She apparently had some recent toothache went to ENT and was told she may have had a dental problem. She then went to dentist and had full x-rays which did not show any abscess or other abnormality. She is now here today to followup. She does have some chronic sinus congestion but no purulent discharge. No recent fevers or chills. Right earache is intermittent. She has frequent postnasal drip but does not take Astelin regularly. Currently not taking any antihistamines. She's had some chronic intermittent vertigo but none currently. She has chronic hearing loss which is unchanged.  Past Medical History  Diagnosis Date  . AODM 11/18/2008  . Carpal tunnel syndrome 08/04/2009  . [place], [place] 03/26/2010  . HYPERTENSION, BENIGN SYSTEMIC 10/12/2006  . INCONTINENCE, STRESS, FEMALE 10/12/2006  . Osteoporosis, unspecified 10/12/2006  . RHINITIS, ALLERGIC 10/12/2006  . TEMPORAL ARTERITIS 10/12/2006  . UNSTEADY GAIT 01/22/2007  . Kidney stone   . Inferior MI 8/12   No past surgical history on file.  reports that she has never smoked. She does not have any smokeless tobacco history on file. Her alcohol and drug histories are not on file. family history is not on file. Allergies  Allergen Reactions  . Penicillins Hives  . Sulfadiazine Hives     Review of Systems  Constitutional: Negative for fever, chills, appetite change and unexpected weight change.  HENT: Positive for ear pain and postnasal drip. Negative for sinus pressure, sore throat and voice change.   Respiratory: Negative for cough and shortness of breath.   Neurological: Negative for headaches.       Objective:   Physical  Exam  Constitutional: She appears well-developed and well-nourished.  HENT:  Right Ear: External ear normal.  Left Ear: External ear normal.  Nose: Nose normal.  Mouth/Throat: Oropharynx is clear and moist.  Neck: Neck supple. No thyromegaly present.  Cardiovascular: Normal rate and regular rhythm.   Pulmonary/Chest: Effort normal and breath sounds normal. No respiratory distress. She has no wheezes. She has no rales.  Lymphadenopathy:    She has no cervical adenopathy.  Neurological: No cranial nerve deficit.  Skin: No rash noted.          Assessment & Plan:  Right otalgia. Unremarkable exam. No signs of infection. Reassurance. She does have some chronic sinus congestion which we suspect is allergic. Try over-the-counter antihistamine such as Claritin or Zyrtec. Increase Astelin to twice daily.

## 2014-01-16 DIAGNOSIS — H47019 Ischemic optic neuropathy, unspecified eye: Secondary | ICD-10-CM | POA: Diagnosis not present

## 2014-01-16 DIAGNOSIS — H40019 Open angle with borderline findings, low risk, unspecified eye: Secondary | ICD-10-CM | POA: Diagnosis not present

## 2014-01-16 DIAGNOSIS — H524 Presbyopia: Secondary | ICD-10-CM | POA: Diagnosis not present

## 2014-02-18 DIAGNOSIS — H40059 Ocular hypertension, unspecified eye: Secondary | ICD-10-CM | POA: Diagnosis not present

## 2014-02-18 DIAGNOSIS — Z961 Presence of intraocular lens: Secondary | ICD-10-CM | POA: Diagnosis not present

## 2014-02-18 DIAGNOSIS — E119 Type 2 diabetes mellitus without complications: Secondary | ICD-10-CM | POA: Diagnosis not present

## 2014-02-18 DIAGNOSIS — H47019 Ischemic optic neuropathy, unspecified eye: Secondary | ICD-10-CM | POA: Diagnosis not present

## 2014-02-18 DIAGNOSIS — H04129 Dry eye syndrome of unspecified lacrimal gland: Secondary | ICD-10-CM | POA: Diagnosis not present

## 2014-02-21 ENCOUNTER — Ambulatory Visit (INDEPENDENT_AMBULATORY_CARE_PROVIDER_SITE_OTHER): Payer: Medicare Other | Admitting: Family Medicine

## 2014-02-21 ENCOUNTER — Encounter: Payer: Self-pay | Admitting: Family Medicine

## 2014-02-21 VITALS — BP 130/70 | HR 73 | Temp 97.8°F | Wt 142.0 lb

## 2014-02-21 DIAGNOSIS — H1130 Conjunctival hemorrhage, unspecified eye: Secondary | ICD-10-CM | POA: Diagnosis not present

## 2014-02-21 DIAGNOSIS — H1132 Conjunctival hemorrhage, left eye: Secondary | ICD-10-CM

## 2014-02-21 NOTE — Patient Instructions (Signed)
Subconjunctival Hemorrhage °A subconjunctival hemorrhage is a bright red patch covering a portion of the white of the eye. The white part of the eye is called the sclera, and it is covered by a thin membrane called the conjunctiva. This membrane is clear, except for tiny blood vessels that you can see with the naked eye. When your eye is irritated or inflamed and becomes red, it is because the vessels in the conjunctiva are swollen. °Sometimes, a blood vessel in the conjunctiva can break and bleed. When this occurs, the blood builds up between the conjunctiva and the sclera, and spreads out to create a red area. The red spot may be very small at first. It may then spread to cover a larger part of the surface of the eye, or even all of the visible white part of the eye. °In almost all cases, the blood will go away and the eye will become white again. Before completely dissolving, however, the red area may spread. It may also become brownish-yellow in color, before going away. If a lot of blood collects under the conjunctiva, it may look like a bulge on the surface of the eye. This looks scary, but it will also eventually flatten out and go away. Subconjunctival hemorrhages do not cause pain, but if swollen, may cause a feeling of irritation. There is no effect on vision.  °CAUSES  °· The most common cause is mild trauma (rubbing the eye, irritation). °· Subconjunctival hemorrhages can happen because of coughing or straining (lifting heavy objects), vomiting, or sneezing. °· In some cases, your doctor may want to check your blood pressure. High blood pressure can also cause a sunconjunctival hemorrhage. °· Severe trauma or blunt injuries. °· Diseases that affect blood clotting (hemophilia, leukemia). °· Abnormalities of blood vessels behind the eye (carotid cavernous sinus fistula). °· Tumors behind the eye. °· Certain drugs (aspirin, coumadin, heparin). °· Recent eye surgery. °HOME CARE INSTRUCTIONS  °· Do not worry  about the appearance of your eye. You may continue your usual activities. °· Often, follow-up is not necessary. °SEEK MEDICAL CARE IF:  °· Your eye becomes painful. °· The bleeding does not disappear within 3 weeks. °· Bleeding occurs elsewhere, for example, under the skin, in the mouth, or in the other eye. °· You have recurring subconjunctival hemorrhages. °SEEK IMMEDIATE MEDICAL CARE IF:  °· Your vision changes or you have difficulty seeing. °· You develop severe headache, persistent vomiting, confusion, or abnormal drowsiness (lethargy). °· Your eye seems to bulge or protrude from the eye socket. °· You notice the sudden appearance of bruises, or have spontaneous bleeding elsewhere on your body. °Document Released: 08/01/2005 Document Revised: 10/24/2011 Document Reviewed: 06/29/2009 °ExitCare® Patient Information ©2015 [employer]. This information is not intended to replace advice given to you by your health care provider. Make sure you discuss any questions you have with your health care provider. ° °

## 2014-02-21 NOTE — Progress Notes (Signed)
Pre visit review using our clinic review tool, if applicable. No additional management support is needed unless otherwise documented below in the visit note. 

## 2014-02-21 NOTE — Progress Notes (Signed)
   Subjective:    Patient ID: [name], female    DOB: [birth year], 80 y.o.   MRN: [id number]  Eye Problem  Associated symptoms include eye redness. Pertinent negatives include no eye discharge, fever or photophobia.   Acute. Left eye redness. She woke up with this today. She actually had eye appointment with the ophthalmologist on Tuesday. History of glaucoma. She denies any left eye injury. No pain. No acute changes in visual acuity. She does not recall rubbing her eye. She has some chronic mild bruising of her upper extremities with mild trauma but no other bleeding competitions recently.  Past Medical History  Diagnosis Date  . AODM 11/18/2008  . Carpal tunnel syndrome 08/04/2009  . [place], [place] 03/26/2010  . HYPERTENSION, BENIGN SYSTEMIC 10/12/2006  . INCONTINENCE, STRESS, FEMALE 10/12/2006  . Osteoporosis, unspecified 10/12/2006  . RHINITIS, ALLERGIC 10/12/2006  . TEMPORAL ARTERITIS 10/12/2006  . UNSTEADY GAIT 01/22/2007  . Kidney stone   . Inferior MI 8/12   No past surgical history on file.  reports that she has never smoked. She does not have any smokeless tobacco history on file. Her alcohol and drug histories are not on file. family history is not on file. Allergies  Allergen Reactions  . Penicillins Hives  . Sulfadiazine Hives      Review of Systems  Constitutional: Negative for fever and chills.  Eyes: Positive for redness. Negative for photophobia, pain, discharge, itching and visual disturbance.       Objective:   Physical Exam  Constitutional: She appears well-developed and well-nourished.  Eyes: EOM are normal. Pupils are equal, round, and reactive to light. Right eye exhibits no discharge. Left eye exhibits no discharge.  Patient has large left subconjunctival hemorrhage. Fundi benign. No hyphema.  Cardiovascular: Normal rate.   Pulmonary/Chest: Effort normal and breath sounds normal. No respiratory distress. She has no wheezes. She has no rales.           Assessment & Plan:  Left subconjunctival hemorrhage. She is reassured that these are generally benign. She does not have any eye pain or change in visual acuity. We explained this should resolve spontaneously.

## 2014-02-25 ENCOUNTER — Other Ambulatory Visit: Payer: Self-pay | Admitting: Family Medicine

## 2014-03-03 ENCOUNTER — Encounter: Payer: Self-pay | Admitting: Family Medicine

## 2014-03-03 ENCOUNTER — Ambulatory Visit (INDEPENDENT_AMBULATORY_CARE_PROVIDER_SITE_OTHER): Payer: Medicare Other | Admitting: Family Medicine

## 2014-03-03 VITALS — BP 130/68 | HR 74 | Wt 137.0 lb

## 2014-03-03 DIAGNOSIS — I10 Essential (primary) hypertension: Secondary | ICD-10-CM

## 2014-03-03 DIAGNOSIS — M316 Other giant cell arteritis: Secondary | ICD-10-CM | POA: Diagnosis not present

## 2014-03-03 DIAGNOSIS — E1165 Type 2 diabetes mellitus with hyperglycemia: Secondary | ICD-10-CM

## 2014-03-03 DIAGNOSIS — E119 Type 2 diabetes mellitus without complications: Secondary | ICD-10-CM | POA: Diagnosis not present

## 2014-03-03 LAB — HEMOGLOBIN A1C: Hgb A1c MFr Bld: 8 % — ABNORMAL HIGH (ref 4.6–6.5)

## 2014-03-03 LAB — SEDIMENTATION RATE: Sed Rate: 44 mm/hr — ABNORMAL HIGH (ref 0–22)

## 2014-03-03 NOTE — Progress Notes (Signed)
   Subjective:    Patient ID: [name], female    DOB: 11-24-1922, 80 y.o.   MRN: [id number]  HPI Patient seen for medical followup. She has history of type 2 diabetes, CAD, hypertension, osteoporosis, temporal arteritis.  Type 2 diabetes. History of poor control. Last A1c 8.1%. Because of her age we elected not to aim for tight control. She currently takes metformin and glimepiride. Not monitoring blood sugars regularly. No polyuria or polydipsia  Hypertension treated with metoprolol and losartan. No recent orthostasis. Denies any chest pains  History of temporal arteritis. We tried multiple times getting her completely off prednisone but she's had breakthrough symptoms. No recent headaches. Takes low dose prednisone 5 mg daily. She has early glaucoma and is followed by ophthalmology.  Past Medical History  Diagnosis Date  . AODM 11/18/2008  . Carpal tunnel syndrome 08/04/2009  . [place], [place] 03/26/2010  . HYPERTENSION, BENIGN SYSTEMIC 10/12/2006  . INCONTINENCE, STRESS, FEMALE 10/12/2006  . Osteoporosis, unspecified 10/12/2006  . RHINITIS, ALLERGIC 10/12/2006  . TEMPORAL ARTERITIS 10/12/2006  . UNSTEADY GAIT 01/22/2007  . Kidney stone   . Inferior MI 8/12   No past surgical history on file.  reports that she has never smoked. She does not have any smokeless tobacco history on file. Her alcohol and drug histories are not on file. family history is not on file. Allergies  Allergen Reactions  . Penicillins Hives  . Sulfadiazine Hives      Review of Systems  Constitutional: Positive for fatigue.  Respiratory: Negative for cough, chest tightness, shortness of breath and wheezing.   Cardiovascular: Negative for chest pain, palpitations and leg swelling.  Neurological: Negative for dizziness, seizures, syncope, weakness, light-headedness and headaches.       Objective:   Physical Exam  Constitutional: She appears well-developed and well-nourished. No distress.    Cardiovascular: Normal rate and regular rhythm.   Pulmonary/Chest: Effort normal and breath sounds normal. No respiratory distress. She has no wheezes. She has no rales.  Musculoskeletal: She exhibits no edema.          Assessment & Plan:  #1 type 2 diabetes. History of marginal control. Recheck A1c #2 hypertension adequately controlled. Continue current medications #3 history of temporal arteritis. Repeat sed rate.

## 2014-03-03 NOTE — Progress Notes (Signed)
Pre visit review using our clinic review tool, if applicable. No additional management support is needed unless otherwise documented below in the visit note. 

## 2014-03-28 ENCOUNTER — Telehealth: Payer: Self-pay | Admitting: Family Medicine

## 2014-03-28 NOTE — Telephone Encounter (Signed)
Pt made appt. No note needed.

## 2014-03-31 ENCOUNTER — Encounter: Payer: Self-pay | Admitting: Family Medicine

## 2014-03-31 ENCOUNTER — Ambulatory Visit (INDEPENDENT_AMBULATORY_CARE_PROVIDER_SITE_OTHER): Payer: Medicare Other | Admitting: Family Medicine

## 2014-03-31 VITALS — BP 130/66 | HR 71 | Temp 97.5°F | Wt 139.0 lb

## 2014-03-31 DIAGNOSIS — H8110 Benign paroxysmal vertigo, unspecified ear: Secondary | ICD-10-CM

## 2014-03-31 NOTE — Progress Notes (Signed)
Pre visit review using our clinic review tool, if applicable. No additional management support is needed unless otherwise documented below in the visit note. 

## 2014-03-31 NOTE — Progress Notes (Signed)
   Subjective:    Patient ID: [name], female    DOB: 08-Oct-1922, 80 y.o.   MRN: [id number]  Dizziness Pertinent negatives include no chest pain, chills, fever, headaches or weakness.   Patient seen with recurrence of chronic problem which is vertigo. She's had this off and on for a least couple of years. Her symptoms tend to be worse frequently early in the morning and when she first gets up and moves around. Somewhat of a poor historian. She describes vertigo symptoms. Not clear if these are related consistently to moving head in one position. No acute visual changes. No headaches. No ataxia. No recent falls. Denies any speech changes or swallowing difficulties. Would try referring her once before to vestibular rehabilitation but she never went.  Past Medical History  Diagnosis Date  . AODM 11/18/2008  . Carpal tunnel syndrome 08/04/2009  . [place], [place] 03/26/2010  . HYPERTENSION, BENIGN SYSTEMIC 10/12/2006  . INCONTINENCE, STRESS, FEMALE 10/12/2006  . Osteoporosis, unspecified 10/12/2006  . RHINITIS, ALLERGIC 10/12/2006  . TEMPORAL ARTERITIS 10/12/2006  . UNSTEADY GAIT 01/22/2007  . Kidney stone   . Inferior MI 8/12   No past surgical history on file.  reports that she has never smoked. She does not have any smokeless tobacco history on file. Her alcohol and drug histories are not on file. family history is not on file. Allergies  Allergen Reactions  . Penicillins Hives  . Sulfadiazine Hives      Review of Systems  Constitutional: Negative for fever and chills.  Eyes: Negative for visual disturbance.  Cardiovascular: Negative for chest pain.  Neurological: Positive for dizziness. Negative for syncope, weakness and headaches.       Objective:   Physical Exam  Constitutional: She is oriented to person, place, and time. She appears well-developed and well-nourished. No distress.  Cardiovascular: Normal rate and regular rhythm.   Pulmonary/Chest: Effort normal and breath  sounds normal. No respiratory distress. She has no wheezes. She has no rales.  Neurological: She is alert and oriented to person, place, and time. No cranial nerve deficit. Coordination normal.  Strength full throughout. No ataxia.          Assessment & Plan:  Chronic vertigo. Suspect benign peripheral positional vertigo. No red flags for worrisome vertigo. We explained medications are usually not helpful. Set up vestibular rehabilitation

## 2014-03-31 NOTE — Patient Instructions (Signed)
Benign Positional Vertigo Vertigo means you feel like you or your surroundings are moving when they are not. Benign positional vertigo is the most common form of vertigo. Benign means that the cause of your condition is not serious. Benign positional vertigo is more common in older adults. CAUSES  Benign positional vertigo is the result of an upset in the labyrinth system. This is an area in the middle ear that helps control your balance. This may be caused by a viral infection, head injury, or repetitive motion. However, often no specific cause is found. SYMPTOMS  Symptoms of benign positional vertigo occur when you move your head or eyes in different directions. Some of the symptoms may include:  Loss of balance and falls.  Vomiting.  Blurred vision.  Dizziness.  Nausea.  Involuntary eye movements (nystagmus). DIAGNOSIS  Benign positional vertigo is usually diagnosed by physical exam. If the specific cause of your benign positional vertigo is unknown, your caregiver may perform imaging tests, such as magnetic resonance imaging (MRI) or computed tomography (CT). TREATMENT  Your caregiver may recommend movements or procedures to correct the benign positional vertigo. Medicines such as meclizine, benzodiazepines, and medicines for nausea may be used to treat your symptoms. In rare cases, if your symptoms are caused by certain conditions that affect the inner ear, you may need surgery. HOME CARE INSTRUCTIONS   Follow your caregiver's instructions.  Move slowly. Do not make sudden body or head movements.  Avoid driving.  Avoid operating heavy machinery.  Avoid performing any tasks that would be dangerous to you or others during a vertigo episode.  Drink enough fluids to keep your urine clear or pale yellow. SEEK IMMEDIATE MEDICAL CARE IF:   You develop problems with walking, weakness, numbness, or using your arms, hands, or legs.  You have difficulty speaking.  You develop  severe headaches.  Your nausea or vomiting continues or gets worse.  You develop visual changes.  Your family or friends notice any behavioral changes.  Your condition gets worse.  You have a fever.  You develop a stiff neck or sensitivity to light. MAKE SURE YOU:   Understand these instructions.  Will watch your condition.  Will get help right away if you are not doing well or get worse. Document Released: 05/09/2006 Document Revised: 10/24/2011 Document Reviewed: 04/21/2011 ExitCare Patient Information 2015 [employer]. This information is not intended to replace advice given to you by your health care provider. Make sure you discuss any questions you have with your health care provider.    

## 2014-04-23 DIAGNOSIS — M6281 Muscle weakness (generalized): Secondary | ICD-10-CM | POA: Diagnosis not present

## 2014-04-23 DIAGNOSIS — R42 Dizziness and giddiness: Secondary | ICD-10-CM | POA: Diagnosis not present

## 2014-04-23 DIAGNOSIS — R269 Unspecified abnormalities of gait and mobility: Secondary | ICD-10-CM | POA: Diagnosis not present

## 2014-04-23 DIAGNOSIS — H8110 Benign paroxysmal vertigo, unspecified ear: Secondary | ICD-10-CM | POA: Diagnosis not present

## 2014-05-02 DIAGNOSIS — M6281 Muscle weakness (generalized): Secondary | ICD-10-CM | POA: Diagnosis not present

## 2014-05-02 DIAGNOSIS — H8110 Benign paroxysmal vertigo, unspecified ear: Secondary | ICD-10-CM | POA: Diagnosis not present

## 2014-05-02 DIAGNOSIS — R42 Dizziness and giddiness: Secondary | ICD-10-CM | POA: Diagnosis not present

## 2014-05-02 DIAGNOSIS — R269 Unspecified abnormalities of gait and mobility: Secondary | ICD-10-CM | POA: Diagnosis not present

## 2014-05-22 ENCOUNTER — Encounter: Payer: Self-pay | Admitting: Interventional Cardiology

## 2014-05-23 ENCOUNTER — Other Ambulatory Visit: Payer: Self-pay | Admitting: Family Medicine

## 2014-05-26 ENCOUNTER — Other Ambulatory Visit: Payer: Self-pay | Admitting: Family Medicine

## 2014-06-03 ENCOUNTER — Ambulatory Visit: Payer: Medicare Other | Admitting: Interventional Cardiology

## 2014-06-11 ENCOUNTER — Encounter: Payer: Self-pay | Admitting: Family Medicine

## 2014-06-11 ENCOUNTER — Ambulatory Visit (INDEPENDENT_AMBULATORY_CARE_PROVIDER_SITE_OTHER): Payer: Medicare Other | Admitting: Family Medicine

## 2014-06-11 VITALS — BP 128/68 | HR 75 | Temp 97.4°F | Wt 137.0 lb

## 2014-06-11 DIAGNOSIS — R51 Headache: Secondary | ICD-10-CM | POA: Diagnosis not present

## 2014-06-11 DIAGNOSIS — K5909 Other constipation: Secondary | ICD-10-CM

## 2014-06-11 DIAGNOSIS — R5381 Other malaise: Secondary | ICD-10-CM

## 2014-06-11 DIAGNOSIS — R42 Dizziness and giddiness: Secondary | ICD-10-CM

## 2014-06-11 DIAGNOSIS — R5383 Other fatigue: Secondary | ICD-10-CM | POA: Diagnosis not present

## 2014-06-11 DIAGNOSIS — R519 Headache, unspecified: Secondary | ICD-10-CM

## 2014-06-11 LAB — TSH: TSH: 0.34 u[IU]/mL — ABNORMAL LOW (ref 0.35–4.50)

## 2014-06-11 LAB — BASIC METABOLIC PANEL
BUN: 14 mg/dL (ref 6–23)
CHLORIDE: 102 meq/L (ref 96–112)
CO2: 26 meq/L (ref 19–32)
Calcium: 9.6 mg/dL (ref 8.4–10.5)
Creatinine, Ser: 1 mg/dL (ref 0.4–1.2)
GFR: 54.81 mL/min — ABNORMAL LOW (ref >60.00)
Glucose, Bld: 283 mg/dL — ABNORMAL HIGH (ref 70–99)
Potassium: 4.6 mEq/L (ref 3.5–5.1)
Sodium: 138 mEq/L (ref 135–145)

## 2014-06-11 NOTE — Progress Notes (Signed)
   Subjective:    Patient ID: [name], female    DOB: 10-08-1922, 80 y.o.   MRN: [id number]  Constipation Pertinent negatives include no nausea or vomiting.  Dizziness Associated symptoms include headaches. Pertinent negatives include no chest pain, coughing, nausea or vomiting.   Patient seen with constipation. She states last week she had diarrhea for 3 or 4 days with several stools per day. She denies any bloody stools. She has some chronic constipation generally and frequently takes MiraLAX up until her diarrhea last week. She is now constipated and had no bowel movement in 3 days. No reported nausea or vomiting. No reported abdominal pain. She has chronic vertigo which is unchanged. She has been through vestibular rehabilitation without much improvement. She has been evaluated by ENT in past for her vertigo.  She has chronic hearing loss. She has had some recent occipital headaches.  No recent head injury.  No acute visual changes. No confusion.  In general, she feels fatigued. She has not had TSH in about 3 years. She is not taking any anticholinergic medications.  Past Medical History  Diagnosis Date  . AODM 11/18/2008  . Carpal tunnel syndrome 08/04/2009  . [place], [place] 03/26/2010  . HYPERTENSION, BENIGN SYSTEMIC 10/12/2006  . INCONTINENCE, STRESS, FEMALE 10/12/2006  . Osteoporosis, unspecified 10/12/2006  . RHINITIS, ALLERGIC 10/12/2006  . TEMPORAL ARTERITIS 10/12/2006  . UNSTEADY GAIT 01/22/2007  . Kidney stone   . Inferior MI 8/12   No past surgical history on file.  reports that she has never smoked. She does not have any smokeless tobacco history on file. Her alcohol and drug histories are not on file. family history is not on file. Allergies  Allergen Reactions  . Penicillins Hives  . Sulfadiazine Hives      Review of Systems  Constitutional: Negative for appetite change and unexpected weight change.  Respiratory: Negative for cough and shortness of breath.     Cardiovascular: Negative for chest pain.  Gastrointestinal: Positive for constipation. Negative for nausea and vomiting.  Genitourinary: Negative for dysuria.  Neurological: Positive for dizziness and headaches.  Psychiatric/Behavioral: Negative for confusion.       Objective:   Physical Exam  Constitutional: She is oriented to person, place, and time. She appears well-developed and well-nourished.  HENT:  Mouth/Throat: Oropharynx is clear and moist.  Eyes: EOM are normal.  Neck: Neck supple.  Cardiovascular: Normal rate and regular rhythm.  Exam reveals no gallop.   Pulmonary/Chest: Effort normal and breath sounds normal. No respiratory distress. She has no wheezes. She has no rales.  Genitourinary:  No impaction.  No mass noted.  Neurological: She is alert and oriented to person, place, and time. No cranial nerve deficit.  No focal weakness.  When attempting to get pt up from supine position couple of times she had fairly severe vertigo symptoms.  Psychiatric: She has a normal mood and affect.          Assessment & Plan:  #1 Constipation. We have discussed measures to reduce. Check TSH with chronic intermittent constipation and general malaise. Check basic metabolic panel with recent diarrhea symptoms. #2 chronic vertigo.  She has unsteady gait and high risk for falls. She has seen ENT and has had vestibular rehab with no improvement.  She has had some recent occipital headaches.  Set up CT head to further evaluate.

## 2014-06-11 NOTE — Progress Notes (Signed)
Pre visit review using our clinic review tool, if applicable. No additional management support is needed unless otherwise documented below in the visit note. 

## 2014-06-11 NOTE — Patient Instructions (Signed)

## 2014-06-12 ENCOUNTER — Other Ambulatory Visit: Payer: Self-pay | Admitting: Family Medicine

## 2014-06-12 DIAGNOSIS — R7989 Other specified abnormal findings of blood chemistry: Secondary | ICD-10-CM

## 2014-06-13 ENCOUNTER — Ambulatory Visit: Payer: Medicare Other | Admitting: Interventional Cardiology

## 2014-06-13 ENCOUNTER — Inpatient Hospital Stay: Admission: RE | Admit: 2014-06-13 | Payer: Medicare Other | Source: Ambulatory Visit

## 2014-06-20 ENCOUNTER — Ambulatory Visit (INDEPENDENT_AMBULATORY_CARE_PROVIDER_SITE_OTHER)
Admission: RE | Admit: 2014-06-20 | Discharge: 2014-06-20 | Disposition: A | Discharge status: Home / Self Care | Payer: Medicare Other | Source: Ambulatory Visit | Attending: Family Medicine | Admitting: Family Medicine

## 2014-06-20 ENCOUNTER — Other Ambulatory Visit: Payer: Medicare Other

## 2014-06-20 DIAGNOSIS — R5381 Other malaise: Secondary | ICD-10-CM

## 2014-06-20 DIAGNOSIS — D329 Benign neoplasm of meninges, unspecified: Secondary | ICD-10-CM | POA: Diagnosis not present

## 2014-06-20 DIAGNOSIS — R5383 Other fatigue: Secondary | ICD-10-CM

## 2014-06-20 DIAGNOSIS — R51 Headache: Secondary | ICD-10-CM | POA: Diagnosis not present

## 2014-06-20 DIAGNOSIS — R42 Dizziness and giddiness: Secondary | ICD-10-CM

## 2014-06-23 ENCOUNTER — Other Ambulatory Visit: Payer: Self-pay

## 2014-06-23 MED ORDER — PREDNISONE 5 MG PO TABS: 5.0000 mg | ORAL_TABLET | Freq: Every day | ORAL | Status: DC

## 2014-06-27 ENCOUNTER — Other Ambulatory Visit (INDEPENDENT_AMBULATORY_CARE_PROVIDER_SITE_OTHER): Payer: Medicare Other

## 2014-06-27 DIAGNOSIS — R946 Abnormal results of thyroid function studies: Secondary | ICD-10-CM | POA: Diagnosis not present

## 2014-06-27 DIAGNOSIS — R7989 Other specified abnormal findings of blood chemistry: Secondary | ICD-10-CM

## 2014-06-27 LAB — T4, FREE: FREE T4: 0.94 ng/dL (ref 0.60–1.60)

## 2014-06-27 LAB — TSH: TSH: 0.96 u[IU]/mL (ref 0.35–4.50)

## 2014-07-01 ENCOUNTER — Telehealth: Payer: Self-pay | Admitting: Family Medicine

## 2014-07-01 ENCOUNTER — Ambulatory Visit: Payer: Medicare Other | Admitting: Interventional Cardiology

## 2014-07-01 NOTE — Telephone Encounter (Signed)
Pt has already been informed about lab results.

## 2014-07-01 NOTE — Telephone Encounter (Signed)
Pt would results of labs.  Pt states she is still having dizzy spells.

## 2014-07-03 NOTE — Telephone Encounter (Signed)
Her dizziness has been CHRONIC for many months/years.  I don't know anything else to offer at this point.  She has already seen ENT and had vestibular rehab.  We could offer neurology referral.

## 2014-07-03 NOTE — Telephone Encounter (Signed)
Do you want pt to schedule an appt? I received a message from pt and she is still experiencing dizzy issues.

## 2014-07-04 ENCOUNTER — Ambulatory Visit (INDEPENDENT_AMBULATORY_CARE_PROVIDER_SITE_OTHER): Payer: Medicare Other | Admitting: Physician Assistant

## 2014-07-04 ENCOUNTER — Other Ambulatory Visit: Payer: Self-pay | Admitting: Family Medicine

## 2014-07-04 ENCOUNTER — Encounter: Payer: Self-pay | Admitting: Physician Assistant

## 2014-07-04 VITALS — BP 120/60 | HR 70 | Ht 65.0 in | Wt 136.4 lb

## 2014-07-04 DIAGNOSIS — I2510 Atherosclerotic heart disease of native coronary artery without angina pectoris: Secondary | ICD-10-CM | POA: Diagnosis not present

## 2014-07-04 DIAGNOSIS — E1159 Type 2 diabetes mellitus with other circulatory complications: Secondary | ICD-10-CM | POA: Diagnosis not present

## 2014-07-04 DIAGNOSIS — I10 Essential (primary) hypertension: Secondary | ICD-10-CM | POA: Diagnosis not present

## 2014-07-04 DIAGNOSIS — H8110 Benign paroxysmal vertigo, unspecified ear: Secondary | ICD-10-CM

## 2014-07-04 DIAGNOSIS — Z905 Acquired absence of kidney: Secondary | ICD-10-CM

## 2014-07-04 DIAGNOSIS — E119 Type 2 diabetes mellitus without complications: Secondary | ICD-10-CM | POA: Insufficient documentation

## 2014-07-04 DIAGNOSIS — R42 Dizziness and giddiness: Secondary | ICD-10-CM

## 2014-07-04 NOTE — Progress Notes (Signed)
[place], [place],   [id number] Phone: [phone] Fax:  [phone]  Date:  07/04/2014   Patient ID:  [name], DOB 1923-03-22, MRN [id number]   PCP:  Eulas Post, MD  Cardiologist: Dr. Tamala Julian?    History of Present Illness: [name] is a 80 y.o. female with inferior STEMI 03/2011 s/p BMS to RCA (EF 50%), diabetes mellitus, single left kidney by prior imaging, thyroid goiter who presents to clinic for follow-up evaluation. She was last seen by Dr. Tamala Julian 04/2013 at which time she was doing well. She is here for annual recall. Lipids are followed by PCP.  She has been doing well without CV symptoms.  She lives alone and remains active doing yard work and working around [employer].  She specifically denies chest pain, shortness of breath, orthopnea, PND, pre-syncope or syncope.  She does have occasional LE edema that resolves with elevation of legs.  She has had problems with BPPV for which she was referred to vestibular rehab, but she has been unable to participate due to transportation issues. She tends to have dizziness when laying on her right side or when looking up very quickly.  She reports compliance with her medications.  She follows a heart healthy diet and avoids salt.  She does not have a regular exercise routine but remains active as above.  She does not smoke.  Lipids and other labs are followed by PCP.   Recent Labs: 11/14/2013: Hemoglobin 11.9* 06/11/2014: Creatinine 1.0; Potassium 4.6 06/27/2014: TSH 0.96  Wt Readings from Last 3 Encounters:  07/04/14 136 lb 6.4 oz (61.871 kg)  06/11/14 137 lb (62.143 kg)  03/31/14 139 lb (63.05 kg)     Past Medical History  Diagnosis Date  . Diabetes mellitus   . Carpal tunnel syndrome 08/04/2009  . [place], [place] 03/26/2010  . Essential hypertension   . INCONTINENCE, STRESS, FEMALE 10/12/2006  . Osteoporosis, unspecified 10/12/2006  . RHINITIS, ALLERGIC 10/12/2006  . TEMPORAL ARTERITIS  10/12/2006  . UNSTEADY GAIT 01/22/2007  . Kidney stone   . CAD (coronary artery disease)     a. Inferior STEMI s/p BMSx2 to RCA. Coronary anomaly with single coronary ostium arising from right sinus of Valsalva and likely LM course between PA and aorta.  . Single kidney     a. Absence of right kidney by prior imaging.  Marland Kitchen BPPV (benign paroxysmal positional vertigo)   . Multinodular goiter   . 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 H/o Left eye hemianopsia (longstanding).    Current Outpatient Prescriptions  Medication Sig Dispense Refill  . aspirin EC 81 MG tablet Take 81 mg by mouth daily.      Marland Kitchen atorvastatin (LIPITOR) 20 MG tablet Take 1 tablet (20 mg total) by mouth daily. 90 tablet 3  . azelastine (ASTELIN) 0.1 % nasal spray USE 2 SPRAYS IN EACH NOSTRILS TWICE DAILY AS DIRECTED 30 mL 3  . Coral Calcium-Magnesium-Vit D [phone] MG-MG-UNIT CAPS Take 1 tablet by mouth daily.     Marland Kitchen glimepiride (AMARYL) 4 MG tablet Take 1 tablet (4 mg total) by mouth daily with breakfast. 90 tablet 3  . losartan (COZAAR) 50 MG tablet TAKE 1 TABLET BY MOUTH EVERY DAY 30 tablet 11  . metFORMIN (GLUCOPHAGE) 500 MG tablet Take 1 tablet (500 mg total) by mouth daily. 90 tablet 3  . methotrexate (RHEUMATREX) 2.5 MG tablet Take 5 mg by mouth once a week. Caution:Chemotherapy.  Protect from light. Takes on Tuesday    . metoprolol succinate (TOPROL-XL) 25 MG 24 hr tablet TAKE 1 TABLET (25 MG TOTAL) BY MOUTH DAILY. 30 tablet 8  . nitroGLYCERIN (NITROSTAT) 0.4 MG SL tablet Place 0.4 mg under the tongue every 5 (five) minutes as needed.      . predniSONE (DELTASONE) 5 MG tablet Take 1 tablet (5 mg total) by mouth daily with breakfast. 90 tablet 1  . TRAVATAN Z 0.004 % SOLN ophthalmic solution Place 1 drop into both eyes at bedtime.   4  . triamcinolone cream (KENALOG) 0.1 % Apply topically as needed.     No current facility-administered medications for this visit.    Allergies:   Penicillins and Sulfadiazine     Social History:  The patient  reports that she has never smoked. She does not have any smokeless tobacco history on file.   Family History:  The patient's family history is notable for CVA  ROS:  Please see the history of present illness.  All other systems reviewed and negative.   PHYSICAL EXAM:  VS:  BP 120/60 mmHg  Pulse 70  Ht 5\' 5"  (1.651 m)  Wt 136 lb 6.4 oz (61.871 kg)  BMI 22.70 kg/m2 Elderly, alert WF, well nourished, well developed, in no acute distress HEENT: normal, HOH Neck: no JVD Cardiac:  normal S1, S2; RRR; no murmur Lungs:  clear to auscultation bilaterally, no wheezing, rhonchi or rales Abd: soft, nontender, no hepatomegaly Ext: no edema, 2+ DP/PT pulses Skin: warm and dry Neuro:  moves all extremities spontaneously, no focal abnormalities noted  EKG:  NSR 70bpm, prior inferior infarct, nonspecific ST upsloping diffusely similar to 04/2011, somewhat improved.  ASSESSMENT AND PLAN:  1. CAD with hx of inferior STEMI/PCI in 03/2011 2. Essential hypertension 3. Diabetes mellitus, followed by PCP 4. H/o single kidney by prior imaging 5. Hyperlipidemia 6. BPPV, followed by PCP  [name] is doing well s/p PCI in 2012.  She has had no recurrence of chest pain and remains active living independently.  Her lipids and other labs are followed by her PCP. She is tolerating her medications well without adverse effects and reports compliance.  We have made no med changes today.  She is aware to call the office with any recurrence of chest pain or anginal symptoms.    Dispo: F/u in 6 months with Dr Tamala Julian.   Signed, Melina Copa, PA-C  Patient seen and examined with Chanetta Marshall NP-S, changes made where appropriate above. 07/04/2014 2:54 PM

## 2014-07-04 NOTE — Patient Instructions (Signed)
Your physician recommends that you continue on your current medications as directed. Please refer to the Current Medication list given to you today.   Your physician wants you to follow-up in: 6 months with Dr. Tamala Julian.  You will receive a reminder letter in the mail two months in advance. If you don't receive a letter, please call our office to schedule the follow-up appointment @ [phone]

## 2014-07-04 NOTE — Telephone Encounter (Signed)
Pt informed. Referral is ordered  

## 2014-07-08 ENCOUNTER — Telehealth: Payer: Self-pay | Admitting: Neurology

## 2014-07-08 NOTE — Telephone Encounter (Signed)
Pt called to r/s her appt. She had a NP appt on 07/09/14 and r/s to 1/12. Dr. Elease Hashimoto referring provider was notified.

## 2014-07-09 ENCOUNTER — Ambulatory Visit: Payer: Medicare Other | Admitting: Neurology

## 2014-07-28 ENCOUNTER — Telehealth: Payer: Self-pay | Admitting: Family Medicine

## 2014-07-28 NOTE — Telephone Encounter (Signed)
Pt called to find out why her appt w/ neuro has been rescheduled out so far.. Pt states she has been living with this dizziness and its getting very difficult. However, according to Dr Georgie Chard note, pt is the one that rescheduled. Pt states her family is on her to get some help w/ her dizziness.

## 2014-07-29 NOTE — Telephone Encounter (Signed)
Left message on Pt Vm informing patient that she will need to call Dr. Tomi Mcclain office to set up appt and to see if she can get on the cancellation list.

## 2014-08-08 ENCOUNTER — Other Ambulatory Visit: Payer: Self-pay | Admitting: Family Medicine

## 2014-08-11 ENCOUNTER — Other Ambulatory Visit: Payer: Self-pay | Admitting: Family Medicine

## 2014-08-26 ENCOUNTER — Ambulatory Visit: Payer: Medicare Other | Admitting: Neurology

## 2014-09-04 ENCOUNTER — Encounter: Payer: Self-pay | Admitting: *Deleted

## 2014-09-08 ENCOUNTER — Ambulatory Visit (INDEPENDENT_AMBULATORY_CARE_PROVIDER_SITE_OTHER): Payer: Medicare Other | Admitting: Neurology

## 2014-09-08 ENCOUNTER — Encounter: Payer: Self-pay | Admitting: Neurology

## 2014-09-08 VITALS — BP 130/60 | HR 78 | Ht 64.0 in | Wt 136.0 lb

## 2014-09-08 DIAGNOSIS — H8112 Benign paroxysmal vertigo, left ear: Secondary | ICD-10-CM | POA: Diagnosis not present

## 2014-09-08 DIAGNOSIS — D329 Benign neoplasm of meninges, unspecified: Secondary | ICD-10-CM

## 2014-09-08 NOTE — Patient Instructions (Signed)
I don't see anything worrisome on exam.  The CT of the head shows a meningioma, which is typically benign and an incidental finding.  Take time getting up or laying down in bed.  Follow up as needed.

## 2014-09-08 NOTE — Progress Notes (Signed)
NEUROLOGY CONSULTATION NOTE  [name] MRN: [id number] DOB: 1922/09/11  Referring provider: Dr. Elease Hashimoto Primary care provider: Dr. Elease Hashimoto  Reason for consult:  Vertigo  HISTORY OF PRESENT ILLNESS: [name] is an 81 year old right-handed woman with type II diabetes mellitus, hypertension, CAD, and prior temporal arteritis who presents for dizziness.  Records, labs and CT of the head reviewed.  She is accompanied by her daughter who provides some history.  She has had episodes of vertigo for several years.  It is positional and tends to be triggered when she lays down at night and turns on her left.  It will last a few seconds to a minute.  There is no associated nausea.  She has hearing loss and notes ringing in her ears.  She also notes some pain in the left ear as well.  She has tried vestibular exercises in the past, which did not work.  At this time, she is feeling fine and it isn't too bad.  CT of the head was performed on 06/20/14, which revealed mild to moderate atrophy, mild chronic small vessel ischemic changes and incidental right frontal meningioma.  Labs from 06/27/14 showed TSH 60f 0.96 and free T4 of 0.94.  Labs from 03/03/14 showed Sed Rage 44 and Hgb A1c of 8.  PAST MEDICAL HISTORY: Past Medical History  Diagnosis Date  . Diabetes mellitus   . Carpal tunnel syndrome 08/04/2009  . [place], [place] 03/26/2010  . Essential hypertension   . INCONTINENCE, STRESS, FEMALE 10/12/2006  . Osteoporosis, unspecified 10/12/2006  . RHINITIS, ALLERGIC 10/12/2006  . TEMPORAL ARTERITIS 10/12/2006  . UNSTEADY GAIT 01/22/2007  . Kidney stone   . CAD (coronary artery disease)     a. Inferior STEMI s/p BMSx2 to RCA. Coronary anomaly with single coronary ostium arising from right sinus of Valsalva and likely LM course between PA and aorta.  . Single kidney     a. Absence of right kidney by prior imaging.  Marland Kitchen BPPV (benign paroxysmal positional vertigo)   . Multinodular goiter   .  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 H/o Left eye hemianopsia (longstanding).    PAST SURGICAL HISTORY: No past surgical history on file.  MEDICATIONS: Current Outpatient Prescriptions on File Prior to Visit  Medication Sig Dispense Refill  . aspirin EC 81 MG tablet Take 81 mg by mouth daily.      Marland Kitchen atorvastatin (LIPITOR) 20 MG tablet TAKE 1 TABLET (20 MG TOTAL) BY MOUTH DAILY. 90 tablet 3  . azelastine (ASTELIN) 0.1 % nasal spray USE 2 SPRAYS IN EACH NOSTRILS TWICE DAILY AS DIRECTED 30 mL 3  . Coral Calcium-Magnesium-Vit D [phone] MG-MG-UNIT CAPS Take 1 tablet by mouth daily.     Marland Kitchen glimepiride (AMARYL) 4 MG tablet Take 1 tablet (4 mg total) by mouth daily with breakfast. 90 tablet 3  . losartan (COZAAR) 50 MG tablet TAKE 1 TABLET BY MOUTH EVERY DAY 30 tablet 11  . metFORMIN (GLUCOPHAGE) 500 MG tablet Take 1 tablet (500 mg total) by mouth daily. 90 tablet 3  . methotrexate (RHEUMATREX) 2.5 MG tablet Take 5 mg by mouth once a week. Caution:Chemotherapy. Protect from light. Takes on Tuesday    . metoprolol succinate (TOPROL-XL) 25 MG 24 hr tablet TAKE 1 TABLET (25 MG TOTAL) BY MOUTH DAILY. 30 tablet 8  . predniSONE (DELTASONE) 5 MG tablet Take 1 tablet (5 mg total) by mouth daily with breakfast. 90 tablet 1  . TRAVATAN  Z 0.004 % SOLN ophthalmic solution Place 1 drop into both eyes at bedtime.   4  . triamcinolone cream (KENALOG) 0.1 % Apply topically as needed.    . nitroGLYCERIN (NITROSTAT) 0.4 MG SL tablet Place 0.4 mg under the tongue every 5 (five) minutes as needed.       No current facility-administered medications on file prior to visit.    ALLERGIES: Allergies  Allergen Reactions  . Penicillins Hives  . Sulfadiazine Hives    FAMILY HISTORY: Family History  Problem Relation Age of Onset  . Heart attack Father   . Stroke Mother     SOCIAL HISTORY: History   Social History  . Marital Status: Widowed    Spouse Name: N/A    Number of Children: N/A  . Years  of Education: N/A   Occupational History  . Not on file.   Social History Main Topics  . Smoking status: Never Smoker   . Smokeless tobacco: Not on file  . Alcohol Use: No  . Drug Use: No  . Sexual Activity: Not on file   Other Topics Concern  . Not on file   Social History Narrative    REVIEW OF SYSTEMS: Constitutional: No fevers, chills, or sweats, no generalized fatigue, change in appetite Eyes: No visual changes, double vision, eye pain Ear, nose and throat: Hearing loss.  nasal congestion, sore throat Cardiovascular: No chest pain, palpitations Respiratory:  No shortness of breath at rest or with exertion, wheezes GastrointestinaI: No nausea, vomiting, diarrhea, abdominal pain, fecal incontinence Genitourinary:  No dysuria, urinary retention or frequency Musculoskeletal:  No neck pain, back pain Integumentary: No rash, pruritus, skin lesions Neurological: as above Psychiatric: No depression, insomnia, anxiety Endocrine: No palpitations, fatigue, diaphoresis, mood swings, change in appetite, change in weight, increased thirst Hematologic/Lymphatic:  No anemia, purpura, petechiae. Allergic/Immunologic: no itchy/runny eyes, nasal congestion, recent allergic reactions, rashes  PHYSICAL EXAM: Filed Vitals:   09/08/14 1335  BP: 130/60  Pulse: 78   General: No acute distress Head:  Normocephalic/atraumatic Eyes:  fundi unremarkable, without vessel changes, exudates, hemorrhages or papilledema. Neck: supple, no paraspinal tenderness, full range of motion Back: No paraspinal tenderness Heart: regular rate and rhythm Lungs: Clear to auscultation bilaterally. Vascular: No carotid bruits. Neurological Exam: Mental status: alert and oriented to person, place, and time, recent and remote memory intact, fund of knowledge intact, attention and concentration intact, speech fluent and not dysarthric, language intact. Cranial nerves: CN I: not tested CN II: pupils equal, round  and reactive to light, visual fields intact, fundi unremarkable, without vessel changes, exudates, hemorrhages or papilledema. CN III, IV, VI:  full range of motion, no nystagmus, no ptosis CN V: facial sensation intact CN VII: upper and lower face symmetric CN VIII: hearing intact CN IX, X: gag intact, uvula midline CN XI: sternocleidomastoid and trapezius muscles intact CN XII: tongue midline Bulk & Tone: normal, no fasciculations. Motor:  5/5 throughout Sensation:  Pinprick and vibration intact Deep Tendon Reflexes:  1+ throughout, toes downgoing. Finger to nose testing:  No dysmetria Gait:  Mildly cautious stride but no ataxia. Romberg with sway.  IMPRESSION: Benign paroxysmal positional vertigo, left Meningioma, incidental finding  PLAN: There really isn't too much to do treatment wise except for having her take care and time getting up and laying down.  The meningioma found on CT is an incidental finding and likely not clinical relevant.  She may follow up as needed.  45 minutes spent with patient, over 50% spent  discussing diagnosis and CT results.  Thank you for allowing me to take part in the care of this patient.  Metta Clines, DO  CC: Carolann Littler, MD

## 2014-09-16 ENCOUNTER — Telehealth: Payer: Self-pay | Admitting: Family Medicine

## 2014-09-16 NOTE — Telephone Encounter (Signed)
i did get note from neurologist.  They did not have any suggestions other than for her to change positions slowly to reduce risk of falls.

## 2014-09-16 NOTE — Telephone Encounter (Signed)
Pt wants to know if you have heard anything from neuro md.  Pt states he did not do anything for her. Would like to know what to do next. Pt is still dizzy from time to time.  Last night was pretty bad.  Pt states her head will hurt from time to time.

## 2014-09-17 NOTE — Telephone Encounter (Signed)
Pt informed

## 2014-09-22 DIAGNOSIS — H4011X1 Primary open-angle glaucoma, mild stage: Secondary | ICD-10-CM | POA: Diagnosis not present

## 2014-09-22 DIAGNOSIS — H47012 Ischemic optic neuropathy, left eye: Secondary | ICD-10-CM | POA: Diagnosis not present

## 2014-09-22 DIAGNOSIS — E119 Type 2 diabetes mellitus without complications: Secondary | ICD-10-CM | POA: Diagnosis not present

## 2014-09-22 DIAGNOSIS — Z961 Presence of intraocular lens: Secondary | ICD-10-CM | POA: Diagnosis not present

## 2014-10-03 ENCOUNTER — Other Ambulatory Visit: Payer: Self-pay | Admitting: Family Medicine

## 2014-10-09 ENCOUNTER — Ambulatory Visit: Payer: PRIVATE HEALTH INSURANCE | Admitting: Family Medicine

## 2014-10-10 ENCOUNTER — Encounter: Payer: Self-pay | Admitting: Family Medicine

## 2014-10-10 ENCOUNTER — Ambulatory Visit (INDEPENDENT_AMBULATORY_CARE_PROVIDER_SITE_OTHER): Payer: Medicare Other | Admitting: Family Medicine

## 2014-10-10 VITALS — BP 120/62 | HR 75 | Temp 97.4°F | Wt 135.0 lb

## 2014-10-10 DIAGNOSIS — M316 Other giant cell arteritis: Secondary | ICD-10-CM | POA: Diagnosis not present

## 2014-10-10 DIAGNOSIS — E119 Type 2 diabetes mellitus without complications: Secondary | ICD-10-CM | POA: Diagnosis not present

## 2014-10-10 DIAGNOSIS — E1165 Type 2 diabetes mellitus with hyperglycemia: Secondary | ICD-10-CM

## 2014-10-10 DIAGNOSIS — M25512 Pain in left shoulder: Secondary | ICD-10-CM | POA: Diagnosis not present

## 2014-10-10 DIAGNOSIS — R42 Dizziness and giddiness: Secondary | ICD-10-CM

## 2014-10-10 LAB — HEMOGLOBIN A1C: Hgb A1c MFr Bld: 8.2 % — ABNORMAL HIGH (ref 4.6–6.5)

## 2014-10-10 LAB — SEDIMENTATION RATE: SED RATE: 40 mm/h — AB (ref 0–22)

## 2014-10-10 MED ORDER — METHYLPREDNISOLONE ACETATE 40 MG/ML IJ SUSP
40.0000 mg | Freq: Once | INTRAMUSCULAR | Status: AC
  Administered 2014-10-10: 40 mg via INTRA_ARTICULAR

## 2014-10-10 MED ORDER — PREDNISONE 1 MG PO TABS
ORAL_TABLET | ORAL | Status: DC
Start: 2014-10-10 — End: 2014-11-28

## 2014-10-10 NOTE — Progress Notes (Signed)
Subjective:    Patient ID: [name], female    DOB: [birth year], 81 y.o.   MRN: [id number]  HPI Patient seen for multiple issues as follows  New issue of left shoulder pain. Onset about 3-4 weeks ago. Denies any injury. She has occasional neck pain but no classic radiculopathy symptoms. No upper extremity numbness or weakness. Pain is worse with internal rotation and to some extent external rotation and abduction.  History of temporal arteritis. She has been for many years on prednisone. We have multiple times in the past tried tapering this back below 5 mg but she's had breakthrough headaches. She is very interested in trying to come off this altogether at this point. She has not had any recent visual changes or headache.  Type 2 diabetes. Last A1c 8.0%. She remains on metformin and glimepiride. No recent hypoglycemia  She has chronic vertigo and his had extensive workup with neurology and ENT. She continues to have issues with this intermittently  Past Medical History  Diagnosis Date  . Diabetes mellitus   . Carpal tunnel syndrome 08/04/2009  . [place], [place] 03/26/2010  . Essential hypertension   . INCONTINENCE, STRESS, FEMALE 10/12/2006  . Osteoporosis, unspecified 10/12/2006  . RHINITIS, ALLERGIC 10/12/2006  . TEMPORAL ARTERITIS 10/12/2006  . UNSTEADY GAIT 01/22/2007  . Kidney stone   . CAD (coronary artery disease)     a. Inferior STEMI s/p BMSx2 to RCA. Coronary anomaly with single coronary ostium arising from right sinus of Valsalva and likely LM course between PA and aorta.  . Single kidney     a. Absence of right kidney by prior imaging.  Marland Kitchen BPPV (benign paroxysmal positional vertigo)   . Multinodular goiter   . 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 H/o Left eye hemianopsia (longstanding).   No past surgical history on file.  reports that she has never smoked. She does not have any smokeless tobacco history on file. She reports that she does not drink alcohol or  use illicit drugs. family history includes Heart attack in her father; Stroke in her mother. Allergies  Allergen Reactions  . Penicillins Hives  . Sulfadiazine Hives      Review of Systems  Constitutional: Positive for fatigue. Negative for appetite change and unexpected weight change.  Eyes: Negative for visual disturbance.  Respiratory: Negative for cough, chest tightness, shortness of breath and wheezing.   Cardiovascular: Negative for chest pain, palpitations and leg swelling.  Endocrine: Negative for polydipsia and polyuria.  Neurological: Positive for dizziness. Negative for seizures, syncope, weakness, light-headedness and headaches.       Objective:   Physical Exam  Constitutional: She appears well-developed and well-nourished.  Cardiovascular: Normal rate and regular rhythm.   Pulmonary/Chest: Effort normal and breath sounds normal. No respiratory distress. She has no wheezes. She has no rales.  Musculoskeletal: She exhibits no edema.  Left shoulder reveals pain with external rotation and internal rotation. She has mild before meals joint tenderness but also some subacromial tenderness. Pain with abducting greater than 90. No biceps tenderness  Neurological:  No strength deficits upper extremity. Symmetric reflexes.          Assessment & Plan:  #1 left shoulder pain. Suspect rotator cuff tendinitis/bursitis.  Discussed risks and benefits of corticosteroid injection and patient consented.  After prepping skin with betadine, injected 40 mg depomedrol and 2 cc of plain xylocaine with 23 gauge one and one half inch needle using posterior lateral approach  and pt tolerated well. Instructed in general range of motion to avoid adhesive capsulitis.  Touch base in 2 weeks if not improved. #2 history of type 2 diabetes. Recheck A1c. Hopefully can further taper off prednisone which should help her control. We are not aiming for extremely tight control with her age of 60 #3  temporal arteritis. We tried multiple times tapering her completely off prednisone. Prescription for 1 mg prednisone and she'll try reducing this to 4 mg and taper regimen given.. Check sedimentation rate

## 2014-10-10 NOTE — Progress Notes (Signed)
Pre visit review using our clinic review tool, if applicable. No additional management support is needed unless otherwise documented below in the visit note. 

## 2014-10-12 DIAGNOSIS — R42 Dizziness and giddiness: Secondary | ICD-10-CM | POA: Insufficient documentation

## 2014-10-28 DIAGNOSIS — S0006XA Insect bite (nonvenomous) of scalp, initial encounter: Secondary | ICD-10-CM | POA: Diagnosis not present

## 2014-10-28 DIAGNOSIS — S0086XA Insect bite (nonvenomous) of other part of head, initial encounter: Secondary | ICD-10-CM | POA: Diagnosis not present

## 2014-10-28 DIAGNOSIS — S1096XA Insect bite of unspecified part of neck, initial encounter: Secondary | ICD-10-CM | POA: Diagnosis not present

## 2014-11-28 ENCOUNTER — Encounter: Payer: Self-pay | Admitting: Family Medicine

## 2014-11-28 ENCOUNTER — Ambulatory Visit (INDEPENDENT_AMBULATORY_CARE_PROVIDER_SITE_OTHER): Payer: Medicare Other | Admitting: Family Medicine

## 2014-11-28 VITALS — BP 126/72 | HR 83 | Temp 97.7°F | Wt 138.0 lb

## 2014-11-28 DIAGNOSIS — M791 Myalgia: Secondary | ICD-10-CM

## 2014-11-28 DIAGNOSIS — M609 Myositis, unspecified: Secondary | ICD-10-CM

## 2014-11-28 DIAGNOSIS — R5383 Other fatigue: Secondary | ICD-10-CM

## 2014-11-28 DIAGNOSIS — IMO0001 Reserved for inherently not codable concepts without codable children: Secondary | ICD-10-CM

## 2014-11-28 LAB — CK: Total CK: 42 U/L (ref 7–177)

## 2014-11-28 LAB — VITAMIN D 25 HYDROXY (VIT D DEFICIENCY, FRACTURES): VITD: 30.62 ng/mL (ref 30.00–100.00)

## 2014-11-28 LAB — SEDIMENTATION RATE: SED RATE: 82 mm/h — AB (ref 0–22)

## 2014-11-28 NOTE — Progress Notes (Signed)
Pre visit review using our clinic review tool, if applicable. No additional management support is needed unless otherwise documented below in the visit note. 

## 2014-11-28 NOTE — Progress Notes (Signed)
   Subjective:    Patient ID: [name], female    DOB: 06-22-1923, 81 y.o.   MRN: [id number]  HPI Patient seen with body aches especially neck and upper back and shoulders. She has history of temporal arteritis many years ago and was recently tapered her completely off prednisone. She thinks she's had some the symptoms for a few weeks now. She had recent TSH which was normal. She does take a statin with Lipitor but has been on this for a few years. She has occasional aches in her legs but mostly upper trunk. No acute visual changes.  No exacerbating or alleviating factors.  No recent fever.  Occasional stiffness.  Chronic vertigo which is essentially unchanged. She has type 2 diabetes currently treated with glimepiride and metformin.  Past Medical History  Diagnosis Date  . Diabetes mellitus   . Carpal tunnel syndrome 08/04/2009  . [place], [place] 03/26/2010  . Essential hypertension   . INCONTINENCE, STRESS, FEMALE 10/12/2006  . Osteoporosis, unspecified 10/12/2006  . RHINITIS, ALLERGIC 10/12/2006  . TEMPORAL ARTERITIS 10/12/2006  . UNSTEADY GAIT 01/22/2007  . Kidney stone   . CAD (coronary artery disease)     a. Inferior STEMI s/p BMSx2 to RCA. Coronary anomaly with single coronary ostium arising from right sinus of Valsalva and likely LM course between PA and aorta.  . Single kidney     a. Absence of right kidney by prior imaging.  Marland Kitchen BPPV (benign paroxysmal positional vertigo)   . Multinodular goiter   . 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 H/o Left eye hemianopsia (longstanding).   No past surgical history on file.  reports that she has never smoked. She does not have any smokeless tobacco history on file. She reports that she does not drink alcohol or use illicit drugs. family history includes Heart attack in her father; Stroke in her mother. Allergies  Allergen Reactions  . Penicillins Hives  . Sulfadiazine Hives      Review of Systems  Constitutional: Positive  for fatigue. Negative for fever and chills.  Respiratory: Negative for cough and shortness of breath.   Cardiovascular: Negative for chest pain.  Gastrointestinal: Negative for abdominal pain.  Genitourinary: Negative for dysuria.  Musculoskeletal: Positive for myalgias and neck pain. Negative for back pain.  Neurological: Negative for headaches.  Hematological: Negative for adenopathy.       Objective:   Physical Exam  Constitutional: She is oriented to person, place, and time. She appears well-developed and well-nourished. No distress.  Eyes: Pupils are equal, round, and reactive to light.  Neck: Neck supple.  Cardiovascular: Normal rate and regular rhythm.   Pulmonary/Chest: Effort normal and breath sounds normal. No respiratory distress. She has no wheezes. She has no rales.  Musculoskeletal: She exhibits no edema.  Lymphadenopathy:    She has no cervical adenopathy.  Neurological: She is alert and oriented to person, place, and time.  Skin: No rash noted.  Psychiatric: She has a normal mood and affect.          Assessment & Plan:  Myalgias. Difficult to sort out. She does have history of temporal arteritis several years ago. Question PMR. She did recently taper off prednisone completely after over 10 years of therapy. Recent TSH normal. Doubt related to statin. Check labs with creatinine kinase, 25 hydroxy vitamin D, repeat sedimentation rate. If sedimentation rate has climbed back up significantly consider starting back prednisone though patient has been reluctant.

## 2014-12-01 ENCOUNTER — Other Ambulatory Visit: Payer: Self-pay

## 2014-12-01 MED ORDER — PREDNISONE 10 MG PO TABS: ORAL_TABLET | ORAL | Status: DC

## 2014-12-17 ENCOUNTER — Encounter: Payer: Self-pay | Admitting: Family Medicine

## 2014-12-17 ENCOUNTER — Ambulatory Visit (INDEPENDENT_AMBULATORY_CARE_PROVIDER_SITE_OTHER): Payer: Medicare Other | Admitting: Family Medicine

## 2014-12-17 VITALS — BP 130/70 | HR 77 | Temp 97.6°F | Wt 138.0 lb

## 2014-12-17 DIAGNOSIS — M353 Polymyalgia rheumatica: Secondary | ICD-10-CM

## 2014-12-17 LAB — SEDIMENTATION RATE: Sed Rate: 23 mm/hr — ABNORMAL HIGH (ref 0–22)

## 2014-12-17 NOTE — Progress Notes (Signed)
   Subjective:    Patient ID: [name], female    DOB: 1923-06-21, 81 y.o.   MRN: [id number]  HPI Patient was recently seen with myalgias. Her CK levels normal and vitamin D level XXX. Sedimentation rate 82. We suspected probable polymyalgia rheumatica. She was placed on prednisone 20 mg daily and symptoms almost totally resolved almost immediately. She has remote history of temporal arteritis. She is currently on 15 mg of prednisone daily and symptoms are relatively stable. She's had only very minimal neck pains intermittently but no significant shoulder or back pains at this point. Overall, feels much better. No fevers or chills. Appetite is stable  Past Medical History  Diagnosis Date  . Diabetes mellitus   . Carpal tunnel syndrome 08/04/2009  . [place], [place] 03/26/2010  . Essential hypertension   . INCONTINENCE, STRESS, FEMALE 10/12/2006  . Osteoporosis, unspecified 10/12/2006  . RHINITIS, ALLERGIC 10/12/2006  . TEMPORAL ARTERITIS 10/12/2006  . UNSTEADY GAIT 01/22/2007  . Kidney stone   . CAD (coronary artery disease)     a. Inferior STEMI s/p BMSx2 to RCA. Coronary anomaly with single coronary ostium arising from right sinus of Valsalva and likely LM course between PA and aorta.  . Single kidney     a. Absence of right kidney by prior imaging.  Marland Kitchen BPPV (benign paroxysmal positional vertigo)   . Multinodular goiter   . 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 H/o Left eye hemianopsia (longstanding).   No past surgical history on file.  reports that she has never smoked. She does not have any smokeless tobacco history on file. She reports that she does not drink alcohol or use illicit drugs. family history includes Heart attack in her father; Stroke in her mother. Allergies  Allergen Reactions  . Penicillins Hives  . Sulfadiazine Hives      Review of Systems  Constitutional: Negative for fever and chills.  Respiratory: Negative for shortness of breath.     Cardiovascular: Negative for chest pain.       Objective:   Physical Exam  Constitutional: She appears well-developed and well-nourished.  Cardiovascular: Normal rate and regular rhythm.   Pulmonary/Chest: Effort normal and breath sounds normal. No respiratory distress. She has no wheezes. She has no rales.          Assessment & Plan:  Myalgias. Strongly suspect polymyalgia rheumatica. She is currently on prednisone 15 mg a day and symptoms are stable. Repeat sedimentation rate. Will consider titration of 12.5 mg. Routine follow-up 2 months. Monitor blood sugars more closely.

## 2014-12-17 NOTE — Patient Instructions (Signed)
Polymyalgia Rheumatica Polymyalgia rheumatica (also called PMR or polymyalgia) is a rheumatologic (arthritic) condition that causes pain and morning stiffness in your neck, shoulders, and hips. It is an inflammatory condition. In some people, inflammation of certain structures in the shoulder, hips, or other joints can be seen on special testing. It does not cause joint destruction, as occurs in other arthritic conditions. It usually occurs after 79 years of age, and is more common as you age. It can be confused with several other diseases, but it is usually easily treated. People with PMR often have, or can develop, a more severe rheumatologic condition called giant cell arteritis (also called CGA or temporal arteritis).  CAUSES  The exact cause of PMR is not known.   There are genetic factors involved.  Viruses have been suspected in the cause of PMR. This has not been proven. SYMPTOMS   Aching, pain, and morning stiffness your neck, both shoulders, or both hips.  Symptoms usually start slowly and build gradually.  Morning stiffness usually lasts at least 30 minutes.  Swelling and tenderness in other joints of the arms, hands, legs, and feet may occur.  Swelling and inflammation in the wrists can cause nerve inflammation at the wrist (carpal tunnel syndrome).  You may also have low grade fever, fatigue, weakness, decreased appetite and weight loss. DIAGNOSIS   Your caregiver may suspect that you have PMR based on your description of your symptoms and on your exam.  Your caregiver will examine you to be sure you do not have diseases that can be confused with PMR. These diseases include rheumatoid arthritis, fibromyalgia, or thyroid disease.  Your caregiver should check for signs of giant cell arteritis. This can cause serious complications such as blindness.  Lab tests can help confirm that you have PMR and not other diseases, but are sometimes inconclusive.  X-rays cannot show PMR.  However, it can identify other diseases like rheumatoid arthritis. Your caregiver may have you see a specialist in arthritis and inflammatory diseases (rheumatologist). TREATMENT  The goal of treatment is relief of symptoms. Treatment does not shorten the course of the illness or prevent complications. With proper treatment, you usually feel better almost right away.   The initial treatment of PMR is usually a cortisone (steroid) medication. Your caregiver will help determine a starting dose. The dose is gradually reduced every few weeks to months. Treatment usually lasts one to three years.  Other stronger medications are rarely needed. They will only be prescribed if your symptoms do not get better on cortisone medication alone, or if they recur as the dose is reduced.  Cortisone medication can have different side effects. With the doses of cortisone needed for PMR, the side effects can affect bones and joints, blood sugar control in diabetes, and mood changes. Discuss this with your caregiver.  Your caregiver will evaluate you regularly during your treatment. They will do this in order to assess progress and to check for complications of the illness or treatment.  Physical therapy is sometimes useful. This is especially true if your joints are still stiff after other symptoms have improved. HOME CARE INSTRUCTIONS   Follow your caregiver's instructions. Do not change your dose of cortisone medication on your own.  Keep your appointments for follow-up lab tests and caregiver visits. Your lab tests need to be monitored. You must get checked periodically for giant cell arteritis.  Follow your caregiver's guidance regarding physical activity (usually no restrictions are needed) or physical therapy.  Your caregiver  may have instructions to prevent or check for side effects from cortisone medication (including bone density testing or treatment). Follow their instructions carefully. SEEK MEDICAL  CARE IF:   You develop any side effects from treatment. Side effects can include:  Elevated blood pressure.  High blood sugar (or worsening of diabetes, if you are diabetic).  Difficulty fighting off infections.  Weight gain.  Weakness of the bones (osteoporosis).  Your aches, pains, morning stiffness, or other symptoms get worse with time. This is especially true after your dose of cortisone is reduced.  You develop new joint symptoms (pain, swelling, etc.) SEEK IMMEDIATE MEDICAL CARE IF:   You develop a severe headache.  You start vomiting.  You have problems with your vision.  You have an oral temperature above 102 F (38.9 C), not controlled by medicine. Document Released: 09/08/2004 Document Revised: 07/18/2012 Document Reviewed: 12/22/2008 [facility] Patient Information 2015 [place], [place]. This information is not intended to replace advice given to you by your health care provider. Make sure you discuss any questions you have with your health care provider.

## 2014-12-17 NOTE — Progress Notes (Signed)
Pre visit review using our clinic review tool, if applicable. No additional management support is needed unless otherwise documented below in the visit note. 

## 2014-12-18 ENCOUNTER — Other Ambulatory Visit: Payer: Self-pay

## 2014-12-18 MED ORDER — PREDNISONE 5 MG PO TABS: ORAL_TABLET | ORAL | Status: DC

## 2015-01-01 ENCOUNTER — Ambulatory Visit (INDEPENDENT_AMBULATORY_CARE_PROVIDER_SITE_OTHER): Payer: Medicare Other | Admitting: Interventional Cardiology

## 2015-01-01 ENCOUNTER — Encounter: Payer: Self-pay | Admitting: Interventional Cardiology

## 2015-01-01 VITALS — BP 140/60 | HR 73 | Ht 64.0 in | Wt 137.6 lb

## 2015-01-01 DIAGNOSIS — E1165 Type 2 diabetes mellitus with hyperglycemia: Secondary | ICD-10-CM

## 2015-01-01 DIAGNOSIS — E119 Type 2 diabetes mellitus without complications: Secondary | ICD-10-CM

## 2015-01-01 DIAGNOSIS — I2510 Atherosclerotic heart disease of native coronary artery without angina pectoris: Secondary | ICD-10-CM | POA: Diagnosis not present

## 2015-01-01 DIAGNOSIS — I10 Essential (primary) hypertension: Secondary | ICD-10-CM | POA: Diagnosis not present

## 2015-01-01 NOTE — Patient Instructions (Signed)

## 2015-01-01 NOTE — Progress Notes (Signed)
Cardiology Office Note   Date:  01/01/2015   ID:  [name], DOB 1922-10-06, MRN [id number]  PCP:  Eulas Post, MD  Cardiologist:   Sinclair Grooms, MD   Chief Complaint  Patient presents with  . Coronary Artery Disease      History of Present Illness: [name] is a 81 y.o. female who presents for diabetes mellitus, hyperlipidemia, CAD, previous right coronary bare metal stent during inferior STEMI, diabetes mellitus, and frailty.  Patient is doing relatively well. She is accompanied by daughter. There is some memory/cognitive impairment. She still lives independently. She is able to get out in her yard and do some work. She does all of her household chores. She sleeps well. She denies angina. She has some aches and pains in her legs and back. She denies lower extremity swelling.    Past Medical History  Diagnosis Date  . Diabetes mellitus   . Carpal tunnel syndrome 08/04/2009  . [place], [place] 03/26/2010  . Essential hypertension   . INCONTINENCE, STRESS, FEMALE 10/12/2006  . Osteoporosis, unspecified 10/12/2006  . RHINITIS, ALLERGIC 10/12/2006  . TEMPORAL ARTERITIS 10/12/2006  . UNSTEADY GAIT 01/22/2007  . Kidney stone   . CAD (coronary artery disease)     a. Inferior STEMI s/p BMSx2 to RCA. Coronary anomaly with single coronary ostium arising from right sinus of Valsalva and likely LM course between PA and aorta.  . Single kidney     a. Absence of right kidney by prior imaging.  Marland Kitchen BPPV (benign paroxysmal positional vertigo)   . Multinodular goiter   . 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 H/o Left eye hemianopsia (longstanding).    No past surgical history on file.   Current Outpatient Prescriptions  Medication Sig Dispense Refill  . aspirin EC 81 MG tablet Take 81 mg by mouth daily.      Marland Kitchen atorvastatin (LIPITOR) 20 MG tablet TAKE 1 TABLET (20 MG TOTAL) BY MOUTH DAILY. 90 tablet 3  . azelastine (ASTELIN) 0.1 % nasal spray USE 2 SPRAYS IN  EACH NOSTRILS TWICE DAILY AS DIRECTED 30 mL 3  . Cholecalciferol (VITAMIN D) 2000 UNITS CAPS Take 2,000 Units by mouth daily.    . [place] Calcium-Magnesium-Vit D [phone] MG-MG-UNIT CAPS Take 1 tablet by mouth daily.     Marland Kitchen glimepiride (AMARYL) 4 MG tablet TAKE 1 TABLET BY MOUTH EVERY DAY WITH BREAKFAST 90 tablet 1  . losartan (COZAAR) 50 MG tablet TAKE 1 TABLET BY MOUTH EVERY DAY 30 tablet 11  . metFORMIN (GLUCOPHAGE) 500 MG tablet TAKE 1 TABLET BY MOUTH EVERY DAY 90 tablet 1  . methotrexate (RHEUMATREX) 2.5 MG tablet Take 5 mg by mouth once a week. Caution:Chemotherapy. Protect from light. Takes on Tuesday    . metoprolol succinate (TOPROL-XL) 25 MG 24 hr tablet TAKE 1 TABLET (25 MG TOTAL) BY MOUTH DAILY. 30 tablet 8  . nitroGLYCERIN (NITROSTAT) 0.4 MG SL tablet Place 0.4 mg under the tongue every 5 (five) minutes as needed.      . predniSONE (DELTASONE) 5 MG tablet Take 2 and one-half tablets daily. 90 tablet 0  . TRAVATAN Z 0.004 % SOLN ophthalmic solution Place 1 drop into both eyes at bedtime.   4  . triamcinolone cream (KENALOG) 0.1 % Apply topically as needed.     No current facility-administered medications for this visit.    Allergies:   Penicillins and Sulfadiazine    Social History:  The  patient  reports that she has never smoked. She does not have any smokeless tobacco history on file. She reports that she does not drink alcohol or use illicit drugs.   Family History:  The patient's family history includes Heart attack in her father; Stroke in her mother.    ROS:  Please see the history of present illness.   Otherwise, review of systems are positive for .   All other systems are reviewed and negative.    PHYSICAL EXAM: VS:  BP 140/60 mmHg  Pulse 73  Ht 5\' 4"  (1.626 m)  Wt 137 lb 9.6 oz (62.415 kg)  BMI 23.61 kg/m2  SpO2 95% , BMI Body mass index is 23.61 kg/(m^2). GEN: Well nourished, well developed, in no acute distress HEENT: normal Neck: no JVD, carotid bruits,  or masses Cardiac: RRR; no murmurs, rubs, or gallops,no edema  Respiratory:  clear to auscultation bilaterally, normal work of breathing GI: soft, nontender, nondistended, + BS MS: no deformity or atrophy Skin: warm and dry, no rash Neuro:  Strength and sensation are intact Psych: euthymic mood, full affect   EKG:  EKG is  notordered today.    Recent Labs: 06/11/2014: BUN 14; Creatinine 1.0; Potassium 4.6; Sodium 138 06/27/2014: TSH 0.96    Lipid Panel    Component Value Date/Time   CHOL 158 04/30/2013 1154   TRIG 94.0 04/30/2013 1154   HDL 60.70 04/30/2013 1154   CHOLHDL 3 04/30/2013 1154   VLDL 18.8 04/30/2013 1154   LDLCALC 79 04/30/2013 1154      Wt Readings from Last 3 Encounters:  01/01/15 137 lb 9.6 oz (62.415 kg)  12/17/14 138 lb (62.596 kg)  11/28/14 138 lb (62.596 kg)      Other studies Reviewed: Additional studies/ records that were reviewed today include: .   ASSESSMENT AND PLAN:  Coronary artery disease involving native coronary artery of native heart without angina pectoris: Denies angina  Essential hypertension: Under control  Poorly controlled type 2 diabetes mellitus: Questionable control  Hyperlipidemia: Followed by Dr. Elease Hashimoto   Current medicines are reviewed at length with the patient today.  The patient does not have concerns regarding medicines.  The following changes have been made:  no change  Labs/ tests ordered today include:  No orders of the defined types were placed in this encounter.     Disposition:   FU with HS in 1 years  Signed, Sinclair Grooms, MD  01/01/2015 4:23 PM    [address], [place], [place]  [address] Phone: [phone]; Fax: [phone]

## 2015-01-08 ENCOUNTER — Ambulatory Visit: Payer: PRIVATE HEALTH INSURANCE | Admitting: Family Medicine

## 2015-01-13 ENCOUNTER — Other Ambulatory Visit: Payer: Self-pay | Admitting: Family Medicine

## 2015-01-16 ENCOUNTER — Telehealth: Payer: Self-pay | Admitting: Family Medicine

## 2015-01-16 ENCOUNTER — Other Ambulatory Visit: Payer: Self-pay | Admitting: Family Medicine

## 2015-01-16 MED ORDER — METFORMIN HCL ER 500 MG PO TB24: 500.0000 mg | ORAL_TABLET | Freq: Every day | ORAL | Status: DC

## 2015-01-16 MED ORDER — PREDNISONE 5 MG PO TABS: ORAL_TABLET | ORAL | Status: DC

## 2015-01-16 NOTE — Telephone Encounter (Signed)
She should definitely stay on the prednisone and refill #100 with 5 refills. I suggest change metformin to Metformin 500 mg ER once daily.  This could help to reduce some side effects.

## 2015-01-16 NOTE — Telephone Encounter (Signed)
Rx's sent to pharmacy. Pt is aware.

## 2015-01-16 NOTE — Telephone Encounter (Signed)
Pt is finished w/ her predniSONE (DELTASONE) 5 MG tablet tomorrow would like to know if she needs to get refilled  Pt also wants to know if the metFORMIN (GLUCOPHAGE) 500 MG tablet may be making her dizzy and stomach pains Pt states her head is stopped up and ears feel stopped up.    [facility]

## 2015-01-20 DIAGNOSIS — H26493 Other secondary cataract, bilateral: Secondary | ICD-10-CM | POA: Diagnosis not present

## 2015-01-20 DIAGNOSIS — H4011X1 Primary open-angle glaucoma, mild stage: Secondary | ICD-10-CM | POA: Diagnosis not present

## 2015-01-26 ENCOUNTER — Ambulatory Visit (INDEPENDENT_AMBULATORY_CARE_PROVIDER_SITE_OTHER): Payer: Medicare Other | Admitting: Family Medicine

## 2015-01-26 ENCOUNTER — Encounter: Payer: Self-pay | Admitting: Family Medicine

## 2015-01-26 VITALS — BP 120/60 | HR 83 | Temp 98.3°F | Wt 136.0 lb

## 2015-01-26 DIAGNOSIS — I2510 Atherosclerotic heart disease of native coronary artery without angina pectoris: Secondary | ICD-10-CM | POA: Diagnosis not present

## 2015-01-26 DIAGNOSIS — M546 Pain in thoracic spine: Secondary | ICD-10-CM

## 2015-01-26 DIAGNOSIS — M549 Dorsalgia, unspecified: Secondary | ICD-10-CM

## 2015-01-26 NOTE — Progress Notes (Signed)
   Subjective:    Patient ID: [name], female    DOB: 01-20-1923, 81 y.o.   MRN: [id number]  HPI 73 your old female with mild upper back pain following fall 2 days ago. She was out pulling weeds in 95 heat. She felt slightly lightheaded. No loss of consciousness. She fell down she states on her buttock. She has had no hip or upper or lower extremity pain as some diffuse soreness across her upper back bilaterally. No confusion. No head injury. No falls since then. She generally ambulates with cane. Long history of intermittent vertigo but denies any recent flares with her vertigo  Past Medical History  Diagnosis Date  . Diabetes mellitus   . Carpal tunnel syndrome 08/04/2009  . [place], [place] 03/26/2010  . Essential hypertension   . INCONTINENCE, STRESS, FEMALE 10/12/2006  . Osteoporosis, unspecified 10/12/2006  . RHINITIS, ALLERGIC 10/12/2006  . TEMPORAL ARTERITIS 10/12/2006  . UNSTEADY GAIT 01/22/2007  . Kidney stone   . CAD (coronary artery disease)     a. Inferior STEMI s/p BMSx2 to RCA. Coronary anomaly with single coronary ostium arising from right sinus of Valsalva and likely LM course between PA and aorta.  . Single kidney     a. Absence of right kidney by prior imaging.  Marland Kitchen BPPV (benign paroxysmal positional vertigo)   . Multinodular goiter   . 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 H/o Left eye hemianopsia (longstanding).   No past surgical history on file.  reports that she has never smoked. She does not have any smokeless tobacco history on file. She reports that she does not drink alcohol or use illicit drugs. family history includes Heart attack in her father; Stroke in her mother. Allergies  Allergen Reactions  . Penicillins Hives  . Sulfadiazine Hives      Review of Systems  Constitutional: Negative for fever and chills.  Respiratory: Negative for shortness of breath.   Cardiovascular: Negative for chest pain.  Genitourinary: Negative for dysuria.    Neurological: Positive for dizziness. Negative for seizures, syncope and headaches.  Psychiatric/Behavioral: Negative for confusion.       Objective:   Physical Exam  Constitutional: She is oriented to person, place, and time. She appears well-developed and well-nourished.  HENT:  Head: Normocephalic and atraumatic.  Neck: Neck supple.  She has some mild bilateral trapezius tenderness. No spinal tenderness.  Cardiovascular: Normal rate.   Pulmonary/Chest: Effort normal and breath sounds normal. No respiratory distress. She has no wheezes. She has no rales.  Musculoskeletal:  Full range of motion both shoulders. Good range of motion cervical spine-considering her age  Neurological: She is alert and oriented to person, place, and time. No cranial nerve deficit.  No upper extremity weakness          Assessment & Plan:  Diffuse upper back pain following fall. No indication for x-rays. She is cautioned about avoiding the heat. Observe for now. She'll take Tylenol if needed.

## 2015-01-26 NOTE — Progress Notes (Signed)
Pre visit review using our clinic review tool, if applicable. No additional management support is needed unless otherwise documented below in the visit note. 

## 2015-01-28 ENCOUNTER — Other Ambulatory Visit: Payer: Self-pay | Admitting: Family Medicine

## 2015-01-30 ENCOUNTER — Telehealth: Payer: Self-pay | Admitting: Family Medicine

## 2015-01-30 NOTE — Telephone Encounter (Signed)
Pt's niece states pt has been falling a lot and they are trying to get her mailbox moved off of [address] If they can get a statement saying that pt needs her mailbox to her door or to a "safe place".  They have spoken to the postmaster, and if they can get this note, they can get this box moved.  Then if you can mail this letter to the pt's home

## 2015-01-30 NOTE — Telephone Encounter (Signed)
Letter done

## 2015-01-30 NOTE — Telephone Encounter (Signed)
[name] is aware. Letter is being mailed out 01/30/15

## 2015-02-27 ENCOUNTER — Encounter: Payer: Self-pay | Admitting: Family Medicine

## 2015-02-27 ENCOUNTER — Ambulatory Visit (INDEPENDENT_AMBULATORY_CARE_PROVIDER_SITE_OTHER): Payer: Medicare Other | Admitting: Family Medicine

## 2015-02-27 VITALS — BP 120/72 | HR 68 | Temp 97.7°F | Ht 64.0 in | Wt 133.5 lb

## 2015-02-27 DIAGNOSIS — M316 Other giant cell arteritis: Secondary | ICD-10-CM

## 2015-02-27 DIAGNOSIS — M353 Polymyalgia rheumatica: Secondary | ICD-10-CM

## 2015-02-27 DIAGNOSIS — I2510 Atherosclerotic heart disease of native coronary artery without angina pectoris: Secondary | ICD-10-CM | POA: Diagnosis not present

## 2015-02-27 DIAGNOSIS — I10 Essential (primary) hypertension: Secondary | ICD-10-CM

## 2015-02-27 DIAGNOSIS — E119 Type 2 diabetes mellitus without complications: Secondary | ICD-10-CM | POA: Diagnosis not present

## 2015-02-27 DIAGNOSIS — E1165 Type 2 diabetes mellitus with hyperglycemia: Secondary | ICD-10-CM

## 2015-02-27 LAB — BASIC METABOLIC PANEL
BUN: 20 mg/dL (ref 6–23)
CALCIUM: 9.4 mg/dL (ref 8.4–10.5)
CO2: 29 mEq/L (ref 19–32)
CREATININE: 1.03 mg/dL (ref 0.40–1.20)
Chloride: 100 mEq/L (ref 96–112)
GFR: 53.5 mL/min — ABNORMAL LOW (ref >60.00)
GLUCOSE: 353 mg/dL — AB (ref 70–99)
Potassium: 4 mEq/L (ref 3.5–5.1)
SODIUM: 137 meq/L (ref 135–145)

## 2015-02-27 LAB — SEDIMENTATION RATE: Sed Rate: 14 mm/hr (ref 0–22)

## 2015-02-27 LAB — HEMOGLOBIN A1C: Hgb A1c MFr Bld: 9.6 % — ABNORMAL HIGH (ref 4.6–6.5)

## 2015-02-27 NOTE — Progress Notes (Signed)
Pre visit review using our clinic review tool, if applicable. No additional management support is needed unless otherwise documented below in the visit note. 

## 2015-02-27 NOTE — Progress Notes (Signed)
   Subjective:    Patient ID: [name], female    DOB: 15-Aug-1923, 81 y.o.   MRN: [id number]  HPI Patient seen for medical follow-up. She has history of temporal arteritis several years ago. We have been for years trying to taper off prednisone and then she had recent flare of back pain, neck pain and myalgias. Sedimentation rate 82. Suspected PMR. Symptoms did improve greatly with prednisone and currently down to 12.5 mg daily and stable.  Type 2 diabetes. History of somewhat poor control. Last A1c 8.1%. Takes low-dose metformin 500 mg once daily and we could not confirm whether she is taking Amaryl 4 mg daily. Does not monitor sugars at home. No polyuria or polydipsia.  Hypertension treated with losartan and metoprolol. No recent dizziness or falls. No chest pains  Past Medical History  Diagnosis Date  . Diabetes mellitus   . Carpal tunnel syndrome 08/04/2009  . [place], [place] 03/26/2010  . Essential hypertension   . INCONTINENCE, STRESS, FEMALE 10/12/2006  . Osteoporosis, unspecified 10/12/2006  . RHINITIS, ALLERGIC 10/12/2006  . TEMPORAL ARTERITIS 10/12/2006  . UNSTEADY GAIT 01/22/2007  . Kidney stone   . CAD (coronary artery disease)     a. Inferior STEMI s/p BMSx2 to RCA. Coronary anomaly with single coronary ostium arising from right sinus of Valsalva and likely LM course between PA and aorta.  . Single kidney     a. Absence of right kidney by prior imaging.  Marland Kitchen BPPV (benign paroxysmal positional vertigo)   . Multinodular goiter   . 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 H/o Left eye hemianopsia (longstanding).   No past surgical history on file.  reports that she has never smoked. She does not have any smokeless tobacco history on file. She reports that she does not drink alcohol or use illicit drugs. family history includes Heart attack in her father; Stroke in her mother. Allergies  Allergen Reactions  . Penicillins Hives  . Sulfadiazine Hives      Review of  Systems  Constitutional: Negative for fatigue.  Eyes: Negative for visual disturbance.  Respiratory: Negative for cough, chest tightness, shortness of breath and wheezing.   Cardiovascular: Negative for chest pain, palpitations and leg swelling.  Neurological: Negative for dizziness, seizures, syncope, weakness, light-headedness and headaches.       Objective:   Physical Exam  Constitutional: She appears well-developed and well-nourished.  Neck: Neck supple. No thyromegaly present.  Cardiovascular: Normal rate and regular rhythm.  Exam reveals no gallop.   No murmur heard. Pulmonary/Chest: Effort normal and breath sounds normal. No respiratory distress. She has no wheezes. She has no rales.  Musculoskeletal: She exhibits no edema.          Assessment & Plan:  #1 probable PMR. Symptomatically stable. Repeat sedimentation rate. Consider further tapering of prednisone to 10 mg if sedimentation rate remains stable and then would proceed with very slow taper at 1 mg increments from the 10 #2 type 2 diabetes. History of poor control. Recheck sedimentation rate #3 hypertension stable and at goal

## 2015-03-02 ENCOUNTER — Other Ambulatory Visit: Payer: Self-pay

## 2015-03-02 MED ORDER — GLIMEPIRIDE 4 MG PO TABS: ORAL_TABLET | ORAL | Status: DC

## 2015-03-11 ENCOUNTER — Other Ambulatory Visit: Payer: Self-pay | Admitting: Family Medicine

## 2015-04-28 ENCOUNTER — Ambulatory Visit (INDEPENDENT_AMBULATORY_CARE_PROVIDER_SITE_OTHER): Payer: Medicare Other | Admitting: Family Medicine

## 2015-04-28 DIAGNOSIS — L02215 Cutaneous abscess of perineum: Secondary | ICD-10-CM | POA: Diagnosis not present

## 2015-04-28 DIAGNOSIS — S81802A Unspecified open wound, left lower leg, initial encounter: Secondary | ICD-10-CM

## 2015-04-28 DIAGNOSIS — I2510 Atherosclerotic heart disease of native coronary artery without angina pectoris: Secondary | ICD-10-CM

## 2015-04-28 NOTE — Progress Notes (Signed)
Subjective:    Patient ID: [name], female    DOB: 04-27-1923, 81 y.o.   MRN: [id number]  HPI  acute visit for laceration left lower lateral leg about 3 weeks ago. She was out in her yard and apparently stepped against the large stick. No known foreign body. Last tetanus September 2013. She's had slow to heal wound. She initially was using some topical alcohol and then subsequently hydrogen peroxide. No fevers or chills. She states this looks somewhat better than a week ago. Minimal pain.  Patient also complains of a separate issue. For 2 weeks she had frequent loose stools and these have finally resolved. Nonbloody stools. No recent antibiotics. She noticed some pain around the perineum region with wiping. She thought she may have hemorrhoids. No bloody stools. No fever or chills.  Past Medical History  Diagnosis Date  . Diabetes mellitus   . Carpal tunnel syndrome 08/04/2009  . [place], [place] 03/26/2010  . Essential hypertension   . INCONTINENCE, STRESS, FEMALE 10/12/2006  . Osteoporosis, unspecified 10/12/2006  . RHINITIS, ALLERGIC 10/12/2006  . TEMPORAL ARTERITIS 10/12/2006  . UNSTEADY GAIT 01/22/2007  . Kidney stone   . CAD (coronary artery disease)     a. Inferior STEMI s/p BMSx2 to RCA. Coronary anomaly with single coronary ostium arising from right sinus of Valsalva and likely LM course between PA and aorta.  . Single kidney     a. Absence of right kidney by prior imaging.  Marland Kitchen BPPV (benign paroxysmal positional vertigo)   . Multinodular goiter   . 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 H/o Left eye hemianopsia (longstanding).   No past surgical history on file.  reports that she has never smoked. She does not have any smokeless tobacco history on file. She reports that she does not drink alcohol or use illicit drugs. family history includes Heart attack in her father; Stroke in her mother. Allergies  Allergen Reactions  . Penicillins Hives  . Sulfadiazine Hives       Review of Systems  Constitutional: Negative for fever, chills, appetite change and unexpected weight change.  Respiratory: Negative for shortness of breath.   Cardiovascular: Negative for chest pain.  Gastrointestinal: Negative for nausea, vomiting, abdominal pain, diarrhea, constipation and abdominal distention.  Genitourinary: Negative for dysuria.  Neurological: Negative for dizziness.  Psychiatric/Behavioral: Negative for confusion.       Objective:   Physical Exam  Constitutional: She appears well-developed and well-nourished.  Cardiovascular: Normal rate and regular rhythm.   Pulmonary/Chest: Effort normal and breath sounds normal. No respiratory distress. She has no wheezes. She has no rales.  Skin:     She has some external skin tags perianal region but no evidence for thrombosed hemorrhoid. Right perineum region she has area of swelling and fluctuance about 2 x 2 centimeters with some minimal purulence draining out of this region. Minimal erythema. Minimally tender. She has several small epidermal cysts in this region          Assessment & Plan:   Open wound/laceration left lower leg. No signs of secondary infection. Avoid hydroperoxide and alcohol. Keep clean daily with soap and water. Continue topical antibiotics. Reassess in one week. Tetanus up-to-date. At this point, there are no cellulitis changes.  Second issue is right perianal abscess. This is starting to drain slightly. Still has some fluctuance. We recommended incision drainage. Patient consented. Skin prepped with Betadine. Anesthesia 1% plain Xylocaine. Using #11 blade made a small  proximal 1 cm linear incision over area fluctuance. We expressed further purulence and contents of sebaceous cyst sac. Probed wound with curved hemostats. Packing with quarter-inch iodoform gauze. Minimal bleeding. Outer dressing applied. Recheck in 2-3 days for packing removal and reassess.

## 2015-04-28 NOTE — Progress Notes (Signed)
Pre visit review using our clinic review tool, if applicable. No additional management support is needed unless otherwise documented below in the visit note. 

## 2015-04-28 NOTE — Patient Instructions (Addendum)
AVOID hydrogen peroxide or alcohol to wound Keep clean daily with soap and water or normal saline Keep covered until healed. Follow up for increased redness, fever, pus drainage.    Abscess An abscess is an infected area that contains a collection of pus and debris.It can occur in almost any part of the body. An abscess is also known as a furuncle or boil. CAUSES  An abscess occurs when tissue gets infected. This can occur from blockage of oil or sweat glands, infection of hair follicles, or a minor injury to the skin. As the body tries to fight the infection, pus collects in the area and creates pressure under the skin. This pressure causes pain. People with weakened immune systems have difficulty fighting infections and get certain abscesses more often.  SYMPTOMS Usually an abscess develops on the skin and becomes a painful mass that is red, warm, and tender. If the abscess forms under the skin, you may feel a moveable soft area under the skin. Some abscesses break open (rupture) on their own, but most will continue to get worse without care. The infection can spread deeper into the body and eventually into the bloodstream, causing you to feel ill.  DIAGNOSIS  Your caregiver will take your medical history and perform a physical exam. A sample of fluid may also be taken from the abscess to determine what is causing your infection. TREATMENT  Your caregiver may prescribe antibiotic medicines to fight the infection. However, taking antibiotics alone usually does not cure an abscess. Your caregiver may need to make a small cut (incision) in the abscess to drain the pus. In some cases, gauze is packed into the abscess to reduce pain and to continue draining the area. HOME CARE INSTRUCTIONS   Only take over-the-counter or prescription medicines for pain, discomfort, or fever as directed by your caregiver.  If you were prescribed antibiotics, take them as directed. Finish them even if you start to  feel better.  If gauze is used, follow your caregiver's directions for changing the gauze.  To avoid spreading the infection:  Keep your draining abscess covered with a bandage.  Wash your hands well.  Do not share personal care items, towels, or whirlpools with others.  Avoid skin contact with others.  Keep your skin and clothes clean around the abscess.  Keep all follow-up appointments as directed by your caregiver. SEEK MEDICAL CARE IF:   You have increased pain, swelling, redness, fluid drainage, or bleeding.  You have muscle aches, chills, or a general ill feeling.  You have a fever. MAKE SURE YOU:   Understand these instructions.  Will watch your condition.  Will get help right away if you are not doing well or get worse. Document Released: 05/11/2005 Document Revised: 01/31/2012 Document Reviewed: 10/14/2011 [facility] Patient Information 2015 [place], [place]. This information is not intended to replace advice given to you by your health care provider. Make sure you discuss any questions you have with your health care provider.

## 2015-05-01 ENCOUNTER — Encounter: Payer: Self-pay | Admitting: Family Medicine

## 2015-05-01 ENCOUNTER — Ambulatory Visit (INDEPENDENT_AMBULATORY_CARE_PROVIDER_SITE_OTHER): Payer: Medicare Other | Admitting: Family Medicine

## 2015-05-01 VITALS — BP 120/54 | HR 67 | Temp 97.8°F | Ht 64.0 in | Wt 133.9 lb

## 2015-05-01 DIAGNOSIS — S81802D Unspecified open wound, left lower leg, subsequent encounter: Secondary | ICD-10-CM

## 2015-05-01 DIAGNOSIS — I2510 Atherosclerotic heart disease of native coronary artery without angina pectoris: Secondary | ICD-10-CM

## 2015-05-01 DIAGNOSIS — L02215 Cutaneous abscess of perineum: Secondary | ICD-10-CM

## 2015-05-01 NOTE — Progress Notes (Signed)
Pre visit review using our clinic review tool, if applicable. No additional management support is needed unless otherwise documented below in the visit note. 

## 2015-05-01 NOTE — Progress Notes (Signed)
   Subjective:    Patient ID: [name], female    DOB: [birth year], 81 y.o.   MRN: [id number]  HPI Patient here for follow-up regarding leg wound and recent right-sided perineal abscess. She has done better with regard to her perineal pain. Less drainage. No fevers or chills. We performed incision and drainage couple days ago. This was packed with iodoform gauze.  Past Medical History  Diagnosis Date  . Diabetes mellitus   . Carpal tunnel syndrome 08/04/2009  . [place], [place] 03/26/2010  . Essential hypertension   . INCONTINENCE, STRESS, FEMALE 10/12/2006  . Osteoporosis, unspecified 10/12/2006  . RHINITIS, ALLERGIC 10/12/2006  . TEMPORAL ARTERITIS 10/12/2006  . UNSTEADY GAIT 01/22/2007  . Kidney stone   . CAD (coronary artery disease)     a. Inferior STEMI s/p BMSx2 to RCA. Coronary anomaly with single coronary ostium arising from right sinus of Valsalva and likely LM course between PA and aorta.  . Single kidney     a. Absence of right kidney by prior imaging.  Marland Kitchen BPPV (benign paroxysmal positional vertigo)   . Multinodular goiter   . 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 H/o Left eye hemianopsia (longstanding).   No past surgical history on file.  reports that she has never smoked. She does not have any smokeless tobacco history on file. She reports that she does not drink alcohol or use illicit drugs. family history includes Heart attack in her father; Stroke in her mother. Allergies  Allergen Reactions  . Penicillins Hives  . Sulfadiazine Hives      Review of Systems  Constitutional: Negative for fever and chills.       Objective:   Physical Exam  Constitutional: She appears well-developed and well-nourished. No distress.  Cardiovascular: Normal rate and regular rhythm.   Pulmonary/Chest: Effort normal and breath sounds normal. No respiratory distress. She has no wheezes. She has no rales.  Skin:  Incision site from recent incision and drainage of right  perineum abscess-looks good. Less erythema. No fluctuance. No drainage. Nontender  Left leg wound is healing well. Good granulation tissue. No cellulitis changes. No drainage.          Assessment & Plan:  #1 left leg wound healing by secondary intention. No signs of secondary infection. Continue to leave off hydroperoxide alcohol. Clean with soap and water. Reassess 3 weeks #2 perineum abscess. Improved following incision/ drainage. Packing is already out. Keep clean with soap and water. Follow-up as needed.

## 2015-05-04 ENCOUNTER — Ambulatory Visit: Payer: Medicare Other | Admitting: Family Medicine

## 2015-06-02 ENCOUNTER — Encounter: Payer: Self-pay | Admitting: Family Medicine

## 2015-06-02 ENCOUNTER — Ambulatory Visit (INDEPENDENT_AMBULATORY_CARE_PROVIDER_SITE_OTHER): Payer: Medicare Other | Admitting: Family Medicine

## 2015-06-02 ENCOUNTER — Telehealth: Payer: Self-pay | Admitting: Family Medicine

## 2015-06-02 VITALS — BP 120/60 | HR 81 | Temp 98.0°F | Wt 131.0 lb

## 2015-06-02 DIAGNOSIS — S70352D Superficial foreign body, left thigh, subsequent encounter: Secondary | ICD-10-CM

## 2015-06-02 DIAGNOSIS — I10 Essential (primary) hypertension: Secondary | ICD-10-CM | POA: Diagnosis not present

## 2015-06-02 DIAGNOSIS — I2510 Atherosclerotic heart disease of native coronary artery without angina pectoris: Secondary | ICD-10-CM | POA: Diagnosis not present

## 2015-06-02 DIAGNOSIS — E785 Hyperlipidemia, unspecified: Secondary | ICD-10-CM | POA: Insufficient documentation

## 2015-06-02 DIAGNOSIS — S90552D Superficial foreign body, left ankle, subsequent encounter: Secondary | ICD-10-CM

## 2015-06-02 DIAGNOSIS — IMO0001 Reserved for inherently not codable concepts without codable children: Secondary | ICD-10-CM

## 2015-06-02 DIAGNOSIS — E1165 Type 2 diabetes mellitus with hyperglycemia: Secondary | ICD-10-CM | POA: Diagnosis not present

## 2015-06-02 DIAGNOSIS — S70252D Superficial foreign body, left hip, subsequent encounter: Secondary | ICD-10-CM

## 2015-06-02 DIAGNOSIS — M353 Polymyalgia rheumatica: Secondary | ICD-10-CM | POA: Diagnosis not present

## 2015-06-02 DIAGNOSIS — S80852D Superficial foreign body, left lower leg, subsequent encounter: Secondary | ICD-10-CM

## 2015-06-02 LAB — LIPID PANEL
Cholesterol: 167 mg/dL (ref 0–200)
HDL: 76.6 mg/dL (ref >39.00)
LDL Cholesterol: 68 mg/dL (ref 0–99)
NonHDL: 89.99
Total CHOL/HDL Ratio: 2
Triglycerides: 112 mg/dL (ref 0.0–149.0)
VLDL: 22.4 mg/dL (ref 0.0–40.0)

## 2015-06-02 LAB — BASIC METABOLIC PANEL
BUN: 19 mg/dL (ref 6–23)
CALCIUM: 10 mg/dL (ref 8.4–10.5)
CHLORIDE: 102 meq/L (ref 96–112)
CO2: 33 meq/L — AB (ref 19–32)
CREATININE: 0.96 mg/dL (ref 0.40–1.20)
GFR: 57.99 mL/min — ABNORMAL LOW (ref >60.00)
Glucose, Bld: 205 mg/dL — ABNORMAL HIGH (ref 70–99)
Potassium: 4.4 mEq/L (ref 3.5–5.1)
Sodium: 141 mEq/L (ref 135–145)

## 2015-06-02 LAB — HEMOGLOBIN A1C: HEMOGLOBIN A1C: 9.4 % — AB (ref 4.6–6.5)

## 2015-06-02 LAB — HEPATIC FUNCTION PANEL
ALK PHOS: 57 U/L (ref 39–117)
ALT: 33 U/L (ref 0–35)
AST: 31 U/L (ref 0–37)
Albumin: 3.7 g/dL (ref 3.5–5.2)
BILIRUBIN DIRECT: 0.1 mg/dL (ref 0.0–0.3)
BILIRUBIN TOTAL: 0.6 mg/dL (ref 0.2–1.2)
TOTAL PROTEIN: 6 g/dL (ref 6.0–8.3)

## 2015-06-02 LAB — SEDIMENTATION RATE: SED RATE: 15 mm/h (ref 0–22)

## 2015-06-02 MED ORDER — GLIMEPIRIDE 4 MG PO TABS: ORAL_TABLET | ORAL | Status: DC

## 2015-06-02 NOTE — Progress Notes (Signed)
   Subjective:    Patient ID: [name], female    DOB: [birth year], 81 y.o.   MRN: [id number]  HPI Follow-up multiple issues  Recent left leg wound. Healing well. No pain. No drainage. No signs of infection  Type 2 diabetes. Patient on chronic prednisone. Does not monitor blood sugars at home. Last A1c 9.6%. No polyuria or polydipsia  Hyperlipidemia. She has history of CAD. No recent chest pains. On Lipitor. Overdue for lipids. No myalgias. She has significant diffuse arthralgias  Temporal arteritis/PMR. She's been on prednisone for many years and has not tolerated tapering in the past. Currently on 10 mg. Recent sedimentation rate stable. No recent change in visual symptoms.  Past Medical History  Diagnosis Date  . Diabetes mellitus ([place])   . Carpal tunnel syndrome 08/04/2009  . [place], [place] 03/26/2010  . Essential hypertension   . INCONTINENCE, STRESS, FEMALE 10/12/2006  . Osteoporosis, unspecified 10/12/2006  . RHINITIS, ALLERGIC 10/12/2006  . TEMPORAL ARTERITIS 10/12/2006  . UNSTEADY GAIT 01/22/2007  . Kidney stone   . CAD (coronary artery disease)     a. Inferior STEMI s/p BMSx2 to RCA. Coronary anomaly with single coronary ostium arising from right sinus of Valsalva and likely LM course between PA and aorta.  . Single kidney     a. Absence of right kidney by prior imaging.  Marland Kitchen BPPV (benign paroxysmal positional vertigo)   . Multinodular goiter   . 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 H/o Left eye hemianopsia (longstanding).   No past surgical history on file.  reports that she has never smoked. She does not have any smokeless tobacco history on file. She reports that she does not drink alcohol or use illicit drugs. family history includes Heart attack in her father; Stroke in her mother. Allergies  Allergen Reactions  . Penicillins Hives  . Sulfadiazine Hives      Review of Systems  Constitutional: Negative for fever, chills, appetite change and  unexpected weight change.  HENT: Negative for sore throat.   Respiratory: Negative for cough and shortness of breath.   Cardiovascular: Negative for chest pain.  Endocrine: Negative for polydipsia and polyuria.  Musculoskeletal: Positive for arthralgias.       Objective:   Physical Exam  Constitutional: She appears well-developed and well-nourished.  Neck: Neck supple. No thyromegaly present.  Cardiovascular: Normal rate and regular rhythm.   Pulmonary/Chest: Effort normal and breath sounds normal. No respiratory distress. She has no wheezes. She has no rales.  Musculoskeletal: She exhibits edema.  Trace edema legs bilaterally  Skin:  Superficial 1 x 1 cm wound left lower lateral leg. Healing well. No signs of secondary infection          Assessment & Plan:  #1 left leg wound. Almost fully healed. Continue good local wound care #2 type 2 diabetes. History of recent poor control. Recheck A1c #3 polymyalgia rheumatica. Recheck sedimentation rate. If stable continue tapering at 1 mg increments #4 dyslipidemia. Recheck lipid and hepatic panel #5 hypertension stable and at goal

## 2015-06-02 NOTE — Telephone Encounter (Signed)
Patient daughter would like to speaking with the MD regarding her competency. Patient daughter wanted to know if a appointment has to be schedule to discuss before her next appointment.

## 2015-06-02 NOTE — Progress Notes (Signed)
Pre visit review using our clinic review tool, if applicable. No additional management support is needed unless otherwise documented below in the visit note. 

## 2015-06-03 NOTE — Telephone Encounter (Signed)
Left message with caller to call back.  IF she has several questions regarding her mother would give her option of scheduling appt to discuss.

## 2015-06-03 NOTE — Telephone Encounter (Signed)
Patient came in after getting MD voicemail to schedule appointment to speak with the MD. Patient daughter wanted to know if the patient had to be present to speak with the MD. Per Estill Bamberg advised the daughter that the mother had to be present in the room due not having HCPOA once the daughter found out that the mother had to present the appointment was cancelled.

## 2015-06-05 ENCOUNTER — Telehealth: Payer: Self-pay | Admitting: Family Medicine

## 2015-06-05 NOTE — Telephone Encounter (Signed)
Daughter call  to ask if someone had call her Mom to discuss a change in her medicine. Daughter [name] would like for some one to call her if there is a change in her Mom medicine

## 2015-06-08 ENCOUNTER — Ambulatory Visit: Payer: Medicare Other | Admitting: Family Medicine

## 2015-06-09 MED ORDER — METFORMIN HCL ER 750 MG PO TB24: 750.0000 mg | ORAL_TABLET | Freq: Every day | ORAL | Status: DC

## 2015-06-09 NOTE — Addendum Note (Signed)
Addended by: Ailene Rud E on: 06/09/2015 10:52 AM   Modules accepted: Orders

## 2015-07-22 ENCOUNTER — Other Ambulatory Visit: Payer: Self-pay | Admitting: Family Medicine

## 2015-07-22 MED ORDER — METOPROLOL SUCCINATE ER 25 MG PO TB24: ORAL_TABLET | ORAL | Status: DC

## 2015-08-06 DIAGNOSIS — H6123 Impacted cerumen, bilateral: Secondary | ICD-10-CM | POA: Diagnosis not present

## 2015-08-12 ENCOUNTER — Other Ambulatory Visit: Payer: Self-pay | Admitting: Family Medicine

## 2015-08-12 NOTE — Telephone Encounter (Signed)
Refill with 2 additional refills. 

## 2015-09-02 ENCOUNTER — Ambulatory Visit (INDEPENDENT_AMBULATORY_CARE_PROVIDER_SITE_OTHER): Payer: Medicare Other | Admitting: Family Medicine

## 2015-09-02 ENCOUNTER — Other Ambulatory Visit: Payer: Self-pay | Admitting: Family Medicine

## 2015-09-02 ENCOUNTER — Encounter: Payer: Self-pay | Admitting: Family Medicine

## 2015-09-02 DIAGNOSIS — M316 Other giant cell arteritis: Secondary | ICD-10-CM

## 2015-09-02 DIAGNOSIS — I10 Essential (primary) hypertension: Secondary | ICD-10-CM

## 2015-09-02 DIAGNOSIS — Z23 Encounter for immunization: Secondary | ICD-10-CM

## 2015-09-02 DIAGNOSIS — E1165 Type 2 diabetes mellitus with hyperglycemia: Secondary | ICD-10-CM

## 2015-09-02 LAB — POCT GLYCOSYLATED HEMOGLOBIN (HGB A1C): HEMOGLOBIN A1C: 8.4

## 2015-09-02 MED ORDER — PREDNISONE 1 MG PO TABS: ORAL_TABLET | ORAL | Status: DC

## 2015-09-02 MED ORDER — PREDNISONE 5 MG PO TABS: ORAL_TABLET | ORAL | Status: DC

## 2015-09-02 NOTE — Patient Instructions (Signed)
Decrease Prednisone to 9 mg daily ( one 5 mg plus four 1 mg tablets once daily)

## 2015-09-02 NOTE — Addendum Note (Signed)
Addended by: Elio Forget on: 09/02/2015 01:50 PM   Modules accepted: Orders

## 2015-09-02 NOTE — Progress Notes (Signed)
Pre visit review using our clinic review tool, if applicable. No additional management support is needed unless otherwise documented below in the visit note. 

## 2015-09-02 NOTE — Progress Notes (Signed)
Subjective:    Patient ID: [name], female    DOB: [birth year], 82 y.o.   MRN: [id number]  HPI  medical follow-up   Type 2 diabetes. Recent poor control. On chronic prednisone with history of temporal arteritis. She has been difficult to taper off prednisone. Currently taking prednisone 10 mg daily though she was supposed to be on 9 mg. Recent sedimentation rate stable. Not monitoring blood sugars regularly at home. We increased her metformin to 750 mg. She remains on Amaryl. No hypoglycemic symptoms   Long history of temporal arteritis. We tried multiple times over the years tapering her off prednisone without much success. She has some chronic vision loss in her left eye related to this.   Hypertension which is treated with losartan and metoprolol. No recent chest pains.  Past Medical History  Diagnosis Date  . Diabetes mellitus ([place])   . Carpal tunnel syndrome 08/04/2009  . [place], [place] 03/26/2010  . Essential hypertension   . INCONTINENCE, STRESS, FEMALE 10/12/2006  . Osteoporosis, unspecified 10/12/2006  . RHINITIS, ALLERGIC 10/12/2006  . TEMPORAL ARTERITIS 10/12/2006  . UNSTEADY GAIT 01/22/2007  . Kidney stone   . CAD (coronary artery disease)     a. Inferior STEMI s/p BMSx2 to RCA. Coronary anomaly with single coronary ostium arising from right sinus of Valsalva and likely LM course between PA and aorta.  . Single kidney     a. Absence of right kidney by prior imaging.  Marland Kitchen BPPV (benign paroxysmal positional vertigo)   . Multinodular goiter   . 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 H/o Left eye hemianopsia (longstanding).   No past surgical history on file.  reports that she has never smoked. She does not have any smokeless tobacco history on file. She reports that she does not drink alcohol or use illicit drugs. family history includes Heart attack in her father; Stroke in her mother. Allergies  Allergen Reactions  . Penicillins Hives  . Sulfadiazine Hives       Review of Systems  Constitutional: Negative for appetite change, fatigue and unexpected weight change.  Eyes: Negative for visual disturbance.  Respiratory: Negative for cough, chest tightness, shortness of breath and wheezing.   Cardiovascular: Negative for chest pain, palpitations and leg swelling.  Neurological: Negative for dizziness, seizures, syncope, weakness, light-headedness and headaches.       Objective:   Physical Exam  Constitutional: She appears well-developed and well-nourished.  Eyes: Pupils are equal, round, and reactive to light.  Neck: Neck supple. No JVD present. No thyromegaly present.  Cardiovascular: Normal rate and regular rhythm.  Exam reveals no gallop.   Pulmonary/Chest: Effort normal and breath sounds normal. No respiratory distress. She has no wheezes. She has no rales.  Musculoskeletal: She exhibits no edema.  Neurological: She is alert.          Assessment & Plan:   #1 temporal arteritis. She has had issues for several years and difficulties tapering off prednisone with recurrent headaches with tapering in the past. They did not taper to 9 mg as we had instructed back in October. Reduce prednisone 9 mg daily and we went over instructions with patient and daughter and recheck sedimentation rate in one month. Plan slow 1 mg increment taper   #2 type 2 diabetes. History of poor control. Repeat A1c today 8.4%. Continue current regimen. Hopefully will improve as we taper off prednisone   #3 hypertension. Stable and at goal. Continue medications above

## 2015-09-04 ENCOUNTER — Telehealth: Payer: Self-pay | Admitting: Family Medicine

## 2015-09-04 NOTE — Telephone Encounter (Signed)
Daughter is aware that we currently do not have any messages for pt.

## 2015-09-04 NOTE — Telephone Encounter (Signed)
Pt could not understand what message was left.  Daughter would like you to call her instead.

## 2015-09-24 ENCOUNTER — Emergency Department (HOSPITAL_COMMUNITY): Payer: Medicare Other

## 2015-09-24 ENCOUNTER — Inpatient Hospital Stay (HOSPITAL_COMMUNITY)
Admission: EM | Admit: 2015-09-24 | Discharge: 2015-09-25 | DRG: 689 | Disposition: A | Discharge status: Home Health | Payer: Medicare Other | Attending: Internal Medicine | Admitting: Internal Medicine

## 2015-09-24 ENCOUNTER — Encounter (HOSPITAL_COMMUNITY): Payer: Self-pay | Admitting: Emergency Medicine

## 2015-09-24 DIAGNOSIS — E1165 Type 2 diabetes mellitus with hyperglycemia: Secondary | ICD-10-CM | POA: Diagnosis present

## 2015-09-24 DIAGNOSIS — I10 Essential (primary) hypertension: Secondary | ICD-10-CM | POA: Diagnosis present

## 2015-09-24 DIAGNOSIS — G9340 Encephalopathy, unspecified: Secondary | ICD-10-CM | POA: Diagnosis present

## 2015-09-24 DIAGNOSIS — R509 Fever, unspecified: Secondary | ICD-10-CM | POA: Diagnosis present

## 2015-09-24 DIAGNOSIS — Y92009 Unspecified place in unspecified non-institutional (private) residence as the place of occurrence of the external cause: Secondary | ICD-10-CM

## 2015-09-24 DIAGNOSIS — M791 Myalgia: Secondary | ICD-10-CM | POA: Diagnosis not present

## 2015-09-24 DIAGNOSIS — H53462 Homonymous bilateral field defects, left side: Secondary | ICD-10-CM | POA: Diagnosis present

## 2015-09-24 DIAGNOSIS — Z882 Allergy status to sulfonamides status: Secondary | ICD-10-CM

## 2015-09-24 DIAGNOSIS — Z7952 Long term (current) use of systemic steroids: Secondary | ICD-10-CM

## 2015-09-24 DIAGNOSIS — M353 Polymyalgia rheumatica: Secondary | ICD-10-CM | POA: Diagnosis not present

## 2015-09-24 DIAGNOSIS — Z7982 Long term (current) use of aspirin: Secondary | ICD-10-CM | POA: Diagnosis not present

## 2015-09-24 DIAGNOSIS — H9190 Unspecified hearing loss, unspecified ear: Secondary | ICD-10-CM | POA: Diagnosis not present

## 2015-09-24 DIAGNOSIS — S81812A Laceration without foreign body, left lower leg, initial encounter: Secondary | ICD-10-CM | POA: Diagnosis present

## 2015-09-24 DIAGNOSIS — Z7984 Long term (current) use of oral hypoglycemic drugs: Secondary | ICD-10-CM | POA: Diagnosis not present

## 2015-09-24 DIAGNOSIS — R531 Weakness: Secondary | ICD-10-CM

## 2015-09-24 DIAGNOSIS — Z79899 Other long term (current) drug therapy: Secondary | ICD-10-CM

## 2015-09-24 DIAGNOSIS — Z955 Presence of coronary angioplasty implant and graft: Secondary | ICD-10-CM

## 2015-09-24 DIAGNOSIS — E118 Type 2 diabetes mellitus with unspecified complications: Secondary | ICD-10-CM | POA: Diagnosis not present

## 2015-09-24 DIAGNOSIS — N390 Urinary tract infection, site not specified: Principal | ICD-10-CM | POA: Diagnosis present

## 2015-09-24 DIAGNOSIS — I252 Old myocardial infarction: Secondary | ICD-10-CM | POA: Diagnosis not present

## 2015-09-24 DIAGNOSIS — W19XXXA Unspecified fall, initial encounter: Secondary | ICD-10-CM | POA: Diagnosis not present

## 2015-09-24 DIAGNOSIS — R0602 Shortness of breath: Secondary | ICD-10-CM | POA: Diagnosis not present

## 2015-09-24 DIAGNOSIS — R0902 Hypoxemia: Secondary | ICD-10-CM | POA: Diagnosis present

## 2015-09-24 DIAGNOSIS — Z9181 History of falling: Secondary | ICD-10-CM | POA: Diagnosis not present

## 2015-09-24 DIAGNOSIS — R52 Pain, unspecified: Secondary | ICD-10-CM | POA: Diagnosis not present

## 2015-09-24 DIAGNOSIS — Z880 Allergy status to penicillin: Secondary | ICD-10-CM

## 2015-09-24 DIAGNOSIS — I2510 Atherosclerotic heart disease of native coronary artery without angina pectoris: Secondary | ICD-10-CM | POA: Diagnosis not present

## 2015-09-24 LAB — BASIC METABOLIC PANEL
Anion gap: 8 (ref 5–15)
BUN: 16 mg/dL (ref 6–20)
CO2: 28 mmol/L (ref 22–32)
Calcium: 9.4 mg/dL (ref 8.9–10.3)
Chloride: 100 mmol/L — ABNORMAL LOW (ref 101–111)
Creatinine, Ser: 0.71 mg/dL (ref 0.44–1.00)
GFR calc Af Amer: >60 mL/min (ref >60)
GFR calc non Af Amer: >60 mL/min (ref >60)
Glucose, Bld: 174 mg/dL — ABNORMAL HIGH (ref 65–99)
Potassium: 4.3 mmol/L (ref 3.5–5.1)
Sodium: 136 mmol/L (ref 135–145)

## 2015-09-24 LAB — URINE MICROSCOPIC-ADD ON

## 2015-09-24 LAB — URINALYSIS, ROUTINE W REFLEX MICROSCOPIC
Bilirubin Urine: NEGATIVE
Glucose, UA: 100 mg/dL — AB
Hgb urine dipstick: NEGATIVE
Ketones, ur: NEGATIVE mg/dL
Nitrite: NEGATIVE
Protein, ur: 30 mg/dL — AB
Specific Gravity, Urine: 1.024 (ref 1.005–1.030)
pH: 7.5 (ref 5.0–8.0)

## 2015-09-24 LAB — TROPONIN I: Troponin I: <0.03 ng/mL (ref <0.031)

## 2015-09-24 LAB — CBC
HCT: 35.5 % — ABNORMAL LOW (ref 36.0–46.0)
Hemoglobin: 11.5 g/dL — ABNORMAL LOW (ref 12.0–15.0)
MCH: 32.8 pg (ref 26.0–34.0)
MCHC: 32.4 g/dL (ref 30.0–36.0)
MCV: 101.1 fL — ABNORMAL HIGH (ref 78.0–100.0)
Platelets: 187 10*3/uL (ref 150–400)
RBC: 3.51 MIL/uL — ABNORMAL LOW (ref 3.87–5.11)
RDW: 13.7 % (ref 11.5–15.5)
WBC: 9.1 10*3/uL (ref 4.0–10.5)

## 2015-09-24 LAB — HEPATIC FUNCTION PANEL
ALT: 27 U/L (ref 14–54)
AST: 32 U/L (ref 15–41)
Albumin: 3.6 g/dL (ref 3.5–5.0)
Alkaline Phosphatase: 57 U/L (ref 38–126)
BILIRUBIN DIRECT: 0.2 mg/dL (ref 0.1–0.5)
BILIRUBIN INDIRECT: 0.8 mg/dL (ref 0.3–0.9)
BILIRUBIN TOTAL: 1 mg/dL (ref 0.3–1.2)
Total Protein: 6.4 g/dL — ABNORMAL LOW (ref 6.5–8.1)

## 2015-09-24 LAB — CBG MONITORING, ED: Glucose-Capillary: 154 mg/dL — ABNORMAL HIGH (ref 65–99)

## 2015-09-24 LAB — GLUCOSE, CAPILLARY
GLUCOSE-CAPILLARY: 112 mg/dL — AB (ref 65–99)
GLUCOSE-CAPILLARY: 166 mg/dL — AB (ref 65–99)

## 2015-09-24 LAB — INFLUENZA PANEL BY PCR (TYPE A & B)
H1N1 flu by pcr: NOT DETECTED
INFLBPCR: NEGATIVE
Influenza A By PCR: NEGATIVE

## 2015-09-24 LAB — BRAIN NATRIURETIC PEPTIDE: B Natriuretic Peptide: 109.2 pg/mL — ABNORMAL HIGH (ref 0.0–100.0)

## 2015-09-24 MED ORDER — LATANOPROST 0.005 % OP SOLN
1.0000 [drp] | Freq: Every day | OPHTHALMIC | Status: DC
  Administered 2015-09-24: 1 [drp] via OPHTHALMIC
  Filled 2015-09-24: qty 2.5

## 2015-09-24 MED ORDER — ACETAMINOPHEN 500 MG PO TABS
1000.0000 mg | ORAL_TABLET | Freq: Four times a day (QID) | ORAL | Status: DC | PRN
Start: 2015-09-24 — End: 2015-09-25

## 2015-09-24 MED ORDER — SODIUM CHLORIDE 0.9 % IV SOLN
INTRAVENOUS | Status: DC
  Administered 2015-09-24 – 2015-09-25 (×2): via INTRAVENOUS

## 2015-09-24 MED ORDER — NITROGLYCERIN 0.4 MG SL SUBL: 0.4000 mg | SUBLINGUAL_TABLET | SUBLINGUAL | Status: DC | PRN

## 2015-09-24 MED ORDER — ENOXAPARIN SODIUM 30 MG/0.3ML ~~LOC~~ SOLN
30.0000 mg | SUBCUTANEOUS | Status: DC
  Administered 2015-09-24: 30 mg via SUBCUTANEOUS
  Filled 2015-09-24: qty 0.3

## 2015-09-24 MED ORDER — VITAMIN D 1000 UNITS PO TABS
2000.0000 [IU] | ORAL_TABLET | Freq: Every day | ORAL | Status: DC
  Administered 2015-09-24 – 2015-09-25 (×2): 2000 [IU] via ORAL
  Filled 2015-09-24 (×2): qty 2

## 2015-09-24 MED ORDER — ONDANSETRON HCL 4 MG/2ML IJ SOLN
4.0000 mg | Freq: Four times a day (QID) | INTRAMUSCULAR | Status: DC | PRN
Start: 2015-09-24 — End: 2015-09-25

## 2015-09-24 MED ORDER — METOPROLOL SUCCINATE ER 25 MG PO TB24
25.0000 mg | ORAL_TABLET | Freq: Every day | ORAL | Status: DC
  Administered 2015-09-24 – 2015-09-25 (×2): 25 mg via ORAL
  Filled 2015-09-24 (×2): qty 1

## 2015-09-24 MED ORDER — INSULIN ASPART 100 UNIT/ML ~~LOC~~ SOLN: 0.0000 [IU] | Freq: Three times a day (TID) | SUBCUTANEOUS | Status: DC

## 2015-09-24 MED ORDER — ACETAMINOPHEN 325 MG PO TABS: 650.0000 mg | ORAL_TABLET | Freq: Four times a day (QID) | ORAL | Status: DC | PRN

## 2015-09-24 MED ORDER — AZELASTINE HCL 0.1 % NA SOLN
2.0000 | Freq: Two times a day (BID) | NASAL | Status: DC
  Administered 2015-09-24 – 2015-09-25 (×2): 2 via NASAL
  Filled 2015-09-24: qty 30

## 2015-09-24 MED ORDER — INSULIN ASPART 100 UNIT/ML ~~LOC~~ SOLN: 0.0000 [IU] | Freq: Every day | SUBCUTANEOUS | Status: DC

## 2015-09-24 MED ORDER — ACETAMINOPHEN 650 MG RE SUPP: 650.0000 mg | Freq: Four times a day (QID) | RECTAL | Status: DC | PRN

## 2015-09-24 MED ORDER — CALCIUM CARBONATE-VITAMIN D 500-200 MG-UNIT PO TABS
0.5000 | ORAL_TABLET | Freq: Every day | ORAL | Status: DC
  Administered 2015-09-25: 0.5 via ORAL
  Filled 2015-09-24: qty 1

## 2015-09-24 MED ORDER — MAGNESIUM OXIDE 400 (241.3 MG) MG PO TABS
200.0000 mg | ORAL_TABLET | Freq: Every day | ORAL | Status: DC
  Administered 2015-09-25: 200 mg via ORAL
  Filled 2015-09-24: qty 1

## 2015-09-24 MED ORDER — ONDANSETRON HCL 4 MG PO TABS: 4.0000 mg | ORAL_TABLET | Freq: Four times a day (QID) | ORAL | Status: DC | PRN

## 2015-09-24 MED ORDER — LOSARTAN POTASSIUM 50 MG PO TABS
50.0000 mg | ORAL_TABLET | Freq: Every day | ORAL | Status: DC
  Administered 2015-09-24 – 2015-09-25 (×2): 50 mg via ORAL
  Filled 2015-09-24 (×2): qty 1

## 2015-09-24 MED ORDER — ALUM & MAG HYDROXIDE-SIMETH 200-200-20 MG/5ML PO SUSP: 30.0000 mL | Freq: Four times a day (QID) | ORAL | Status: DC | PRN

## 2015-09-24 MED ORDER — POLYETHYLENE GLYCOL 3350 17 G PO PACK: 17.0000 g | PACK | Freq: Every day | ORAL | Status: DC | PRN

## 2015-09-24 MED ORDER — ASPIRIN EC 81 MG PO TBEC
81.0000 mg | DELAYED_RELEASE_TABLET | Freq: Every day | ORAL | Status: DC
  Administered 2015-09-24 – 2015-09-25 (×2): 81 mg via ORAL
  Filled 2015-09-24 (×3): qty 1

## 2015-09-24 MED ORDER — CORAL CALCIUM-MAGNESIUM-VIT D 250-125-200 MG-MG-UNIT PO CAPS: 1.0000 | ORAL_CAPSULE | Freq: Every day | ORAL | Status: DC

## 2015-09-24 MED ORDER — ACETAMINOPHEN 325 MG PO TABS
650.0000 mg | ORAL_TABLET | Freq: Once | ORAL | Status: AC
  Administered 2015-09-24: 650 mg via ORAL
  Filled 2015-09-24: qty 2

## 2015-09-24 NOTE — H&P (Addendum)
History and Physical:    [name]   [id number] DOB: [birth year] DOA: 09/24/2015  Referring MD/provider: Dr. Wilson Singer PCP: Eulas Post, MD   Chief Complaint: "I couldn't get out of bed ".  History of Present Illness:   [name] is an 82 y.o. female with PMH of diabetes, CAD status post inferior STEMI with bare metal stents 2 to the RCA, hypertension, lives alone who was brought to the hospital with a chief complaint of generalized weakness and myalgias. Patient was found by her daughter this morning unable to move in with a laceration to her left lower leg. There has not been any history of shortness of breath or cough. No nausea or vomiting. EMS reported that the patient's oxygen saturations were 83% on arrival, and increased to 97% on oxygen. Review of systems is limited secondary to patient's extreme hearing loss and inability to understand by questions.  ROS:   Review of Systems  Constitutional: Positive for fever. Negative for chills.  HENT: Positive for hearing loss.   Eyes:       Left eye hemianopsia  Respiratory: Negative.   Cardiovascular: Negative.   Gastrointestinal: Negative.   Genitourinary: Negative.   Musculoskeletal: Positive for myalgias and falls.  Skin:       Wound left lower leg  Neurological: Positive for weakness.  Endo/Heme/Allergies: Bruises/bleeds easily.  Psychiatric/Behavioral: Negative.      Past Medical History:   Past Medical History  Diagnosis Date  . Diabetes mellitus ([place])   . Carpal tunnel syndrome 08/04/2009  . [place], [place] 03/26/2010  . Essential hypertension   . INCONTINENCE, STRESS, FEMALE 10/12/2006  . Osteoporosis, unspecified 10/12/2006  . RHINITIS, ALLERGIC 10/12/2006  . TEMPORAL ARTERITIS 10/12/2006  . UNSTEADY GAIT 01/22/2007  . Kidney stone   . CAD (coronary artery disease)     a. Inferior STEMI s/p BMSx2 to RCA. Coronary anomaly with single coronary ostium arising from right sinus of Valsalva and likely  LM course between PA and aorta.  . Single kidney     a. Absence of right kidney by prior imaging.  Marland Kitchen BPPV (benign paroxysmal positional vertigo)   . Multinodular goiter   . 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 H/o Left eye hemianopsia (longstanding).  . Inferior MI ([place]) 04/21/2011    Past Surgical History:   Past Surgical History  Procedure Laterality Date  . Coronary angioplasty    . Cataract extraction, bilateral      Social History:   Social History   Social History  . Marital Status: Widowed    Spouse Name: N/A  . Number of Children: N/A  . Years of Education: N/A   Occupational History  . Not on file.   Social History Main Topics  . Smoking status: Never Smoker   . Smokeless tobacco: Never Used  . Alcohol Use: No  . Drug Use: No  . Sexual Activity: No   Other Topics Concern  . Not on file   Social History Narrative    Family history:   Family History  Problem Relation Age of Onset  . Heart attack Father   . Stroke Mother     Allergies   Penicillins and Sulfadiazine  Current Medications:   Prior to Admission medications   Medication Sig Start Date End Date Taking? Authorizing Provider  acetaminophen (TYLENOL) 500 MG tablet Take 1,000 mg by mouth every 6 (six) hours as needed for mild pain.   Yes Historical  Provider, MD  aspirin EC 81 MG tablet Take 81 mg by mouth daily.     Yes Historical Provider, MD  atorvastatin (LIPITOR) 20 MG tablet TAKE 1 TABLET (20 MG TOTAL) BY MOUTH DAILY. 08/11/14  Yes Eulas Post, MD  azelastine (ASTELIN) 0.1 % nasal spray USE 2 SPRAYS IN EACH NOSTRILS TWICE DAILY AS DIRECTED Patient taking differently: USE 2 SPRAYS IN EACH NOSTRILS TWICE DAILY AS Needed for stuffy nose 01/28/15  Yes Eulas Post, MD  Cholecalciferol (VITAMIN D) 2000 UNITS CAPS Take 2,000 Units by mouth daily.   Yes Historical Provider, MD  Coral Calcium-Magnesium-Vit D [phone] MG-MG-UNIT CAPS Take 1 tablet by mouth daily.    Yes  Historical Provider, MD  glimepiride (AMARYL) 4 MG tablet TAKE 1 TABLET BY MOUTH EVERY DAY WITH BREAKFAST 06/02/15  Yes Eulas Post, MD  losartan (COZAAR) 50 MG tablet TAKE 1 TABLET BY MOUTH EVERY DAY 07/22/15  Yes Eulas Post, MD  metFORMIN (GLUCOPHAGE-XR) 750 MG 24 hr tablet TAKE 1 TABLET (750 MG TOTAL) BY MOUTH DAILY WITH BREAKFAST. 09/02/15  Yes Eulas Post, MD  methotrexate (RHEUMATREX) 2.5 MG tablet TAKE 2 TABLETS BY MOUTH ONCE A WEEK Patient taking differently: TAKE 2 TABLETS BY MOUTH ONCE A WEEK on Thursday 09/03/15  Yes Eulas Post, MD  metoprolol succinate (TOPROL-XL) 25 MG 24 hr tablet TAKE 1 TABLET (25 MG TOTAL) BY MOUTH DAILY. 07/22/15  Yes Eulas Post, MD  nitroGLYCERIN (NITROSTAT) 0.4 MG SL tablet Place 0.4 mg under the tongue every 5 (five) minutes as needed.     Yes Historical Provider, MD  predniSONE (DELTASONE) 1 MG tablet Take 4 tablets once daily. Patient taking differently: Take 4 mg by mouth daily. Take 4 tablets once daily. 09/02/15  Yes Eulas Post, MD  predniSONE (DELTASONE) 5 MG tablet Take one daily Patient taking differently: Take 5 mg by mouth daily.  09/02/15  Yes Eulas Post, MD  TRAVATAN Z 0.004 % SOLN ophthalmic solution Place 1 drop into both eyes at bedtime.  05/26/14  Yes Historical Provider, MD  triamcinolone cream (KENALOG) 0.1 % Apply 1 application topically as needed (for rash).  08/29/12  Yes Eulas Post, MD    Physical Exam:   Filed Vitals:   09/24/15 1142 09/24/15 1223 09/24/15 1319 09/24/15 1501  BP:  155/62 152/61 121/59  Pulse:  89 92 82  Temp: 100.9 F (38.3 C)   99.4 F (37.4 C)  TempSrc: Rectal   Rectal  Resp:  20 18 16   SpO2:  99% 97% 96%     Physical Exam: Blood pressure 121/59, pulse 82, temperature 99.4 F (37.4 C), temperature source Rectal, resp. rate 16, SpO2 96 %. Gen: No acute distress. Somnolent, extremely hard of hearing. Head: Normocephalic, atraumatic. Eyes: PERRL, EOMI, sclerae  nonicteric. Mouth: Oropharynx clear. Neck: Supple, no thyromegaly, no lymphadenopathy, no jugular venous distention. Chest: Lungs clear to auscultation bilaterally. CV: Heart sounds are regular. No murmurs, rubs, or gallops. Abdomen: Soft, nontender, nondistended with normal active bowel sounds. Extremities: Extremities are without clubbing, edema, or cyanosis. Skin: Warm and dry. Left lower leg laceration. Some surrounding erythema. Neuro: Lethargic and oriented times 2; grossly nonfocal. Psych: Mood and affect flat.   Data Review:    Labs: Basic Metabolic Panel:  Recent Labs Lab 09/24/15 1031  NA 136  K 4.3  CL 100*  CO2 28  GLUCOSE 174*  BUN 16  CREATININE 0.71  CALCIUM 9.4   Liver Function  Tests: No results for input(s): AST, ALT, ALKPHOS, BILITOT, PROT, ALBUMIN in the last 168 hours. No results for input(s): LIPASE, AMYLASE in the last 168 hours. No results for input(s): AMMONIA in the last 168 hours. CBC:  Recent Labs Lab 09/24/15 1031  WBC 9.1  HGB 11.5*  HCT 35.5*  MCV 101.1*  PLT 187   Cardiac Enzymes:  Recent Labs Lab 09/24/15 1031  TROPONINI <0.03    BNP (last 3 results) No results for input(s): PROBNP in the last 8760 hours. CBG:  Recent Labs Lab 09/24/15 1031  GLUCAP 154*    Radiographic Studies: Dg Chest 2 View  09/24/2015  CLINICAL DATA:  Shortness of breath and altered mental status EXAM: CHEST  2 VIEW COMPARISON:  October 27, 2010 FINDINGS: There is no edema or consolidation. The heart is upper normal in size with pulmonary vascularity within normal limits. There is atherosclerotic calcification in the aorta. There is no apparent adenopathy. There is pectus carinatum. There is degenerative change in the thoracic spine. IMPRESSION: No edema or consolidation. Electronically Signed   By: Lowella Grip III M.D.   On: 09/24/2015 12:35   *I have personally reviewed the images above*  EKG: Independently reviewed. Sinus rhythm at 87 bpm.  Nonspecific ST wave changes.   Assessment/Plan:   Principal Problem:   Fever, myalgia, and generalized weakness/acute encephalopathy/transient hypoxia - Admit for observation. Continue supplemental oxygen as needed to maintain oxygen saturations. - Check influenza studies. - Patient presents with AMS and lethargy consistent with acute encephalopathy, likely toxic. - Febrile, but no current indication of sepsis. - Urinalysis with leukocytes and rare bacteria. Culture sent. - Chest x-ray clear. - PT/OT evaluations requested.  Active Problems:   CAD (coronary artery disease) - Continue aspirin. Continue statin.    Poorly controlled type 2 diabetes mellitus (HCC) - Hold metformin and use insulin sensitive SSI.    Essential hypertension - Continue metoprolol and Cozaar.    PMR (polymyalgia rheumatica) (HCC) - Hold methotrexate and prednisone given fever.    DVT prophylaxis - Lovenox ordered.  Code Status / Family Communication / Disposition Plan:   Code Status: Full. Family Communication: No family currently at the bedside. Message left for Sharene Butters (daughter) on cell phone. Disposition Plan: Home when stable.   Time spent: One hour.  RAMA,CHRISTINA [facility] Pager [phone] Cell: [phone]   If 7PM-7AM, please contact night-coverage [email or web address] Password [email or web address] 09/24/2015, 3:04 PM

## 2015-09-24 NOTE — Progress Notes (Addendum)
   09/24/15 0000  CM Assessment  Expected Discharge [place]  In-house Referral NA  Discharge Planning Services CM Consult  [facility] Choice Home Health  Choice offered to / list presented to  Patient  Status of Service In process, will continue to follow  Discharge Disposition [place]   82 yr old medicare/[employer] pt ([place]) Pt HOH noted hearing aid in left ear but pt informs CM she thinks both ears "are bad. I do better with reading lips" Pt Alert and oriented x 4  Pt states she called her adopted daughter when she was in bed and was weak this morning. Adopted daughter states she was going to take her to pcp but unable to.  Confirms pt lives alone and has not used home health services Pt and adopted daughter states that the adopted son "is in charge" Pt winked her right eye at Cm after saying this and the adopted daughter said " oh I'm not even going to talked about that" and laughed Pt confirms having a cane, and walker at home but no bedside commode nor a w/c CM discussed Cm consult to discussed home care options- home health & PDN CM reviewed in details medicare guidelines, home health [facility]) (length of stay in home, types of [facility] staff available, coverage, primary caregiver, up to 24 hrs before services may be started) and Private duty nursing (PDN-coverage, length of stay in the home types of staff available).  CM provided pt/family with a list of [place] home health agencies and PDN.  Pt when asked if she would like HH or PDN stated "no not right now" Adopted daughter reminded pt of "sue.  Her niece" who per pt lives "[place], [place] " I have stayed with her before about a week" " we will discuss it" Dr Wilson Singer updated about pt offered options He wants to get pt evaluated for admission Cm discussed with pt that EDP is awaiting further tests and wants pt to discuss her options with her family

## 2015-09-24 NOTE — ED Notes (Addendum)
Lives at home alone, daughter found her today, patient wasn't able to move around on her own d/t generalized body aches. Laceration to left lower leg that happened at some point in the past, the daughter wants it examined. Per EMS, no coughing/vomiting recently. 83% O2 on RA on EMS arrival. Placed on O2 and came up to 97%. On RA in triage at 93%

## 2015-09-24 NOTE — Progress Notes (Signed)
Entered in d/c instructions [name] Go on 10/05/2015 you have a 10/05/15 1: 15 pom one month follow up appointment with Dr Elease Hashimoto [address] [place] [address] [phone]

## 2015-09-24 NOTE — ED Notes (Signed)
Bed: [id number] Expected date:  Expected time:  Means of arrival:  Comments: 89yoF/body aches/weakness

## 2015-09-24 NOTE — ED Provider Notes (Signed)
CSN: [id number]     Arrival date & time 09/24/15  1002 History   First MD Initiated Contact with Patient 09/24/15 1015     Chief Complaint  Patient presents with  . Weakness  . Generalized Body Aches     (Consider location/radiation/quality/duration/timing/severity/associated sxs/prior Treatment) HPI   82yf brought in by daughter for evaluation after fell at home. Pt is not sure why she fell. She is hard of hearing and difficult to get a clear history from. Daughter seems to think it happened sometimes this morning. She reports the patient is at her baseline mental status. Recent laceration to leg which daughter wants examined. It preceded this fall though. No vomiting or diarrhea. No cough. Pt has not voiced specific acute complaints to daughter recently.   Past Medical History  Diagnosis Date  . Diabetes mellitus ([place])   . Carpal tunnel syndrome 08/04/2009  . [place], [place] 03/26/2010  . Essential hypertension   . INCONTINENCE, STRESS, FEMALE 10/12/2006  . Osteoporosis, unspecified 10/12/2006  . RHINITIS, ALLERGIC 10/12/2006  . TEMPORAL ARTERITIS 10/12/2006  . UNSTEADY GAIT 01/22/2007  . Kidney stone   . CAD (coronary artery disease)     a. Inferior STEMI s/p BMSx2 to RCA. Coronary anomaly with single coronary ostium arising from right sinus of Valsalva and likely LM course between PA and aorta.  . Single kidney     a. Absence of right kidney by prior imaging.  Marland Kitchen BPPV (benign paroxysmal positional vertigo)   . Multinodular goiter   . 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 H/o Left eye hemianopsia (longstanding).   History reviewed. No pertinent past surgical history. Family History  Problem Relation Age of Onset  . Heart attack Father   . Stroke Mother    Social History  Substance Use Topics  . Smoking status: Never Smoker   . Smokeless tobacco: None  . Alcohol Use: No   OB History    No data available     Review of Systems  All systems reviewed and negative,  other than as noted in HPI.   Allergies  Penicillins and Sulfadiazine  Home Medications   Prior to Admission medications   Medication Sig Start Date End Date Taking? Authorizing Provider  acetaminophen (TYLENOL) 500 MG tablet Take 1,000 mg by mouth every 6 (six) hours as needed for mild pain.   Yes Historical Provider, MD  aspirin EC 81 MG tablet Take 81 mg by mouth daily.     Yes Historical Provider, MD  atorvastatin (LIPITOR) 20 MG tablet TAKE 1 TABLET (20 MG TOTAL) BY MOUTH DAILY. 08/11/14  Yes Eulas Post, MD  azelastine (ASTELIN) 0.1 % nasal spray USE 2 SPRAYS IN EACH NOSTRILS TWICE DAILY AS DIRECTED Patient taking differently: USE 2 SPRAYS IN EACH NOSTRILS TWICE DAILY AS Needed for stuffy nose 01/28/15  Yes Eulas Post, MD  Cholecalciferol (VITAMIN D) 2000 UNITS CAPS Take 2,000 Units by mouth daily.   Yes Historical Provider, MD  Coral Calcium-Magnesium-Vit D [phone] MG-MG-UNIT CAPS Take 1 tablet by mouth daily.    Yes Historical Provider, MD  glimepiride (AMARYL) 4 MG tablet TAKE 1 TABLET BY MOUTH EVERY DAY WITH BREAKFAST 06/02/15  Yes Eulas Post, MD  losartan (COZAAR) 50 MG tablet TAKE 1 TABLET BY MOUTH EVERY DAY 07/22/15  Yes Eulas Post, MD  metFORMIN (GLUCOPHAGE-XR) 750 MG 24 hr tablet TAKE 1 TABLET (750 MG TOTAL) BY MOUTH DAILY WITH BREAKFAST. 09/02/15  Yes Bruce  Dan Europe, MD  methotrexate (RHEUMATREX) 2.5 MG tablet TAKE 2 TABLETS BY MOUTH ONCE A WEEK Patient taking differently: TAKE 2 TABLETS BY MOUTH ONCE A WEEK on Thursday 09/03/15  Yes Eulas Post, MD  metoprolol succinate (TOPROL-XL) 25 MG 24 hr tablet TAKE 1 TABLET (25 MG TOTAL) BY MOUTH DAILY. 07/22/15  Yes Eulas Post, MD  nitroGLYCERIN (NITROSTAT) 0.4 MG SL tablet Place 0.4 mg under the tongue every 5 (five) minutes as needed.     Yes Historical Provider, MD  predniSONE (DELTASONE) 1 MG tablet Take 4 tablets once daily. Patient taking differently: Take 4 mg by mouth daily. Take 4  tablets once daily. 09/02/15  Yes Eulas Post, MD  predniSONE (DELTASONE) 5 MG tablet Take one daily Patient taking differently: Take 5 mg by mouth daily.  09/02/15  Yes Eulas Post, MD  TRAVATAN Z 0.004 % SOLN ophthalmic solution Place 1 drop into both eyes at bedtime.  05/26/14  Yes Historical Provider, MD  triamcinolone cream (KENALOG) 0.1 % Apply 1 application topically as needed (for rash).  08/29/12  Yes Eulas Post, MD   BP 155/62 mmHg  Pulse 89  Temp(Src) 100.9 F (38.3 C) (Rectal)  Resp 20  SpO2 99% Physical Exam  Constitutional: She appears well-developed and well-nourished. No distress.  HENT:  Head: Normocephalic and atraumatic.  Eyes: Conjunctivae are normal. Right eye exhibits no discharge. Left eye exhibits no discharge.  Neck: Neck supple.  Cardiovascular: Normal rate, regular rhythm and normal heart sounds.  Exam reveals no gallop and no friction rub.   No murmur heard. Pulmonary/Chest: Effort normal and breath sounds normal. No respiratory distress.  Abdominal: Soft. She exhibits no distension. There is no tenderness.  Musculoskeletal: She exhibits no edema or tenderness.  Subacute appearing laceration to R shin. Mild surrounding erythema. No bleeding. No purulence. No increased warmth. No abscess. No midlines spinal tenderness. No apparent pain with ROM of large joints.   Neurological: She is alert. She exhibits normal muscle tone. Coordination normal.  Skin: Skin is warm and dry.  Psychiatric: She has a normal mood and affect. Her behavior is normal.  Nursing note and vitals reviewed.   ED Course  Procedures (including critical care time) Labs Review Labs Reviewed  BASIC METABOLIC PANEL - Abnormal; Notable for the following:    Chloride 100 (*)    Glucose, Bld 174 (*)    All other components within normal limits  CBC - Abnormal; Notable for the following:    RBC 3.51 (*)    Hemoglobin 11.5 (*)    HCT 35.5 (*)    MCV 101.1 (*)    All other  components within normal limits  URINALYSIS, ROUTINE W REFLEX MICROSCOPIC (NOT AT [facility]) - Abnormal; Notable for the following:    APPearance CLOUDY (*)    Glucose, UA 100 (*)    Protein, ur 30 (*)    Leukocytes, UA MODERATE (*)    All other components within normal limits  BRAIN NATRIURETIC PEPTIDE - Abnormal; Notable for the following:    B Natriuretic Peptide 109.2 (*)    All other components within normal limits  URINE MICROSCOPIC-ADD ON - Abnormal; Notable for the following:    Squamous Epithelial / LPF 0-5 (*)    Bacteria, UA RARE (*)    All other components within normal limits  CBG MONITORING, ED - Abnormal; Notable for the following:    Glucose-Capillary 154 (*)    All other components within normal limits  URINE CULTURE  TROPONIN I    Imaging Review Dg Chest 2 View  09/24/2015  CLINICAL DATA:  Shortness of breath and altered mental status EXAM: CHEST  2 VIEW COMPARISON:  October 27, 2010 FINDINGS: There is no edema or consolidation. The heart is upper normal in size with pulmonary vascularity within normal limits. There is atherosclerotic calcification in the aorta. There is no apparent adenopathy. There is pectus carinatum. There is degenerative change in the thoracic spine. IMPRESSION: No edema or consolidation. Electronically Signed   By: Lowella Grip III M.D.   On: 09/24/2015 12:35   I have personally reviewed and evaluated these images and lab results as part of my medical decision-making.   EKG Interpretation   Date/Time:  Thursday September 24 2015 10:28:14 EST Ventricular Rate:  87 PR Interval:  163 QRS Duration: 98 QT Interval:  355 QTC Calculation: 427 R Axis:   66 Text Interpretation:  Sinus rhythm Non-specific ST-t changes Confirmed by  Wilson Singer  MD, Cailah Reach ([id number]) on 09/24/2015 10:56:57 AM      MDM   Final diagnoses:  Poorly controlled type 2 diabetes mellitus ([place])  Fever Myalgias  82 year old female with generalized weakness and body aches. Body  aches likely secondary to febrile illness. Etiology not completely clear. Moderate leukocytes on urinalysis but only rare bacteria, no blood and only mild elevation in WBCs.  Urine culture sent. Hypoxemia noted. No home oxygen requirement. No adventitious breath sounds. Chest x-ray is without acute abnormality. May potentially be developing some cellulitis around R shin wound but not overly impressive and wouldn't necessarily expect fever/systemitc symptoms from this. May simply be viral illness.    Virgel Manifold, MD 09/29/15 1321

## 2015-09-25 DIAGNOSIS — G9340 Encephalopathy, unspecified: Secondary | ICD-10-CM | POA: Diagnosis not present

## 2015-09-25 DIAGNOSIS — N390 Urinary tract infection, site not specified: Secondary | ICD-10-CM | POA: Diagnosis not present

## 2015-09-25 DIAGNOSIS — E1165 Type 2 diabetes mellitus with hyperglycemia: Secondary | ICD-10-CM | POA: Diagnosis not present

## 2015-09-25 DIAGNOSIS — R531 Weakness: Secondary | ICD-10-CM | POA: Diagnosis not present

## 2015-09-25 DIAGNOSIS — R0902 Hypoxemia: Secondary | ICD-10-CM | POA: Diagnosis not present

## 2015-09-25 DIAGNOSIS — S81812A Laceration without foreign body, left lower leg, initial encounter: Secondary | ICD-10-CM | POA: Diagnosis not present

## 2015-09-25 DIAGNOSIS — H53462 Homonymous bilateral field defects, left side: Secondary | ICD-10-CM | POA: Diagnosis not present

## 2015-09-25 LAB — URINE CULTURE

## 2015-09-25 LAB — BASIC METABOLIC PANEL
Anion gap: 8 (ref 5–15)
BUN: 16 mg/dL (ref 6–20)
CHLORIDE: 105 mmol/L (ref 101–111)
CO2: 26 mmol/L (ref 22–32)
Calcium: 9 mg/dL (ref 8.9–10.3)
Creatinine, Ser: 0.86 mg/dL (ref 0.44–1.00)
GFR calc non Af Amer: 58 mL/min — ABNORMAL LOW (ref >60)
Glucose, Bld: 174 mg/dL — ABNORMAL HIGH (ref 65–99)
POTASSIUM: 4.1 mmol/L (ref 3.5–5.1)
SODIUM: 139 mmol/L (ref 135–145)

## 2015-09-25 LAB — CBC
HEMATOCRIT: 31.9 % — AB (ref 36.0–46.0)
HEMOGLOBIN: 10.3 g/dL — AB (ref 12.0–15.0)
MCH: 32.9 pg (ref 26.0–34.0)
MCHC: 32.3 g/dL (ref 30.0–36.0)
MCV: 101.9 fL — ABNORMAL HIGH (ref 78.0–100.0)
Platelets: 175 10*3/uL (ref 150–400)
RBC: 3.13 MIL/uL — AB (ref 3.87–5.11)
RDW: 13.6 % (ref 11.5–15.5)
WBC: 8.4 10*3/uL (ref 4.0–10.5)

## 2015-09-25 LAB — GLUCOSE, CAPILLARY: GLUCOSE-CAPILLARY: 97 mg/dL (ref 65–99)

## 2015-09-25 MED ORDER — CIPROFLOXACIN HCL 500 MG PO TABS: 500.0000 mg | ORAL_TABLET | Freq: Two times a day (BID) | ORAL | Status: DC

## 2015-09-25 MED ORDER — CIPROFLOXACIN HCL 500 MG PO TABS
500.0000 mg | ORAL_TABLET | Freq: Two times a day (BID) | ORAL | Status: DC
  Administered 2015-09-25: 500 mg via ORAL
  Filled 2015-09-25: qty 1

## 2015-09-25 NOTE — Discharge Summary (Addendum)
[facility]  Physician Discharge Summary   Patient ID: [name] MRN: [id number] DOB/AGE: [birth year] 82 y.o.  Admit date: 09/24/2015 Discharge date: 09/25/2015  PCP: Eulas Post, MD  DISCHARGE DIAGNOSES:  Principal Problem:   Fever, myalgia, and generalized weakness Active Problems:   CAD (coronary artery disease)   Poorly controlled type 2 diabetes mellitus ([place])   Essential hypertension   PMR (polymyalgia rheumatica) (HCC)   Acute encephalopathy   RECOMMENDATIONS FOR OUTPATIENT FOLLOW UP: 1. Home health will be ordered 2. Short course of ciprofloxacin for presumed UTI   DISCHARGE CONDITION: fair  Diet recommendation: As before  Filed Weights    INITIAL HISTORY: [name] is an 82 y.o. female with PMH of diabetes, CAD status post inferior STEMI with bare metal stents 2 to the RCA, hypertension, lives alone who was brought to the hospital with a chief complaint of generalized weakness and myalgias. Patient was found by her daughter this morning unable to move in with a laceration to her left lower leg. There has not been any history of shortness of breath or cough. No nausea or vomiting. EMS reported that the patient's oxygen saturations were 83% on arrival, and increased to 97% on oxygen.   Consultations:  None  Procedures:  None  HOSPITAL COURSE:   Fever, myalgia, and generalized weakness/acute encephalopathy/transient hypoxia/Presumed UTI Patient was observed overnight. Influenza PCR was negative. Chest x-ray did not show any infiltrates. Patient did not have any respiratory symptoms. UA was abnormal. Fever has resolved. WBC is normal. She is saturating normal on room air even with ambulation. Definitely her weakness and fever could be from a urinary tract infection. She'll be prescribed a short course of ciprofloxacin. Urine cultures are pending. Patient's daughter-in-law was explained about these issues. She was asked to monitor the patient  closely for side effects. Asian seen by physical therapy and home health has been recommended.  CAD (coronary artery disease) Continue aspirin. Continue statin.  Poorly controlled type 2 diabetes mellitus Resume home medications  Essential hypertension Continue metoprolol and Cozaar.  PMR (polymyalgia rheumatica) She may resume her home medications.  Overall, stable. Difficult to communicate with patient due to her extreme hearing impairment. Discussed in detail with her daughter-in-law. Okay for discharge today.  PERTINENT LABS:  The results of significant diagnostics from this hospitalization (including imaging, microbiology, ancillary and laboratory) are listed below for reference.      Labs: Basic Metabolic Panel:  Recent Labs Lab 09/24/15 1031 09/25/15 0932  NA 136 139  K 4.3 4.1  CL 100* 105  CO2 28 26  GLUCOSE 174* 174*  BUN 16 16  CREATININE 0.71 0.86  CALCIUM 9.4 9.0   Liver Function Tests:  Recent Labs Lab 09/24/15 1149  AST 32  ALT 27  ALKPHOS 57  BILITOT 1.0  PROT 6.4*  ALBUMIN 3.6   CBC:  Recent Labs Lab 09/24/15 1031 09/25/15 0932  WBC 9.1 8.4  HGB 11.5* 10.3*  HCT 35.5* 31.9*  MCV 101.1* 101.9*  PLT 187 175   Cardiac Enzymes:  Recent Labs Lab 09/24/15 1031  TROPONINI <0.03   BNP: BNP (last 3 results)  Recent Labs  09/24/15 1031  BNP 109.2*   CBG:  Recent Labs Lab 09/24/15 1031 09/24/15 1813 09/24/15 2131 09/25/15 0756  GLUCAP 154* 112* 166* 97     IMAGING STUDIES Dg Chest 2 View  09/24/2015  CLINICAL DATA:  Shortness of breath and altered mental status EXAM: CHEST  2 VIEW COMPARISON:  October 27, 2010 FINDINGS: There is no edema or consolidation. The heart is upper normal in size with pulmonary vascularity within normal limits. There is atherosclerotic calcification in the aorta. There is no apparent adenopathy. There is pectus carinatum. There is degenerative change in the thoracic spine. IMPRESSION: No edema or  consolidation. Electronically Signed   By: Lowella Grip III M.D.   On: 09/24/2015 12:35    DISCHARGE EXAMINATION: Filed Vitals:   09/24/15 1501 09/24/15 1535 09/24/15 2130 09/25/15 0547  BP: 121/59 127/64 137/52 122/69  Pulse: 82 77 80 79  Temp: 99.4 F (37.4 C) 98.8 F (37.1 C) 98.5 F (36.9 C) 98.5 F (36.9 C)  TempSrc: Rectal Oral Oral Oral  Resp: 16 16 16 16   Height:  5' (1.524 m)    SpO2: 96% 100% 99% 99%   General appearance: alert, cooperative, appears stated age and no distress Resp: clear to auscultation bilaterally Cardio: regular rate and rhythm, S1, S2 normal, no murmur, click, rub or gallop GI: soft, non-tender; bowel sounds normal; no masses,  no organomegaly  DISPOSITION: Home with home health  Discharge Instructions    Call MD for:  difficulty breathing, headache or visual disturbances    Complete by:  As directed      Call MD for:  extreme fatigue    Complete by:  As directed      Call MD for:  persistant dizziness or light-headedness    Complete by:  As directed      Call MD for:  persistant nausea and vomiting    Complete by:  As directed      Call MD for:  redness, tenderness, or signs of infection (pain, swelling, redness, odor or green/yellow discharge around incision site)    Complete by:  As directed      Call MD for:  severe uncontrolled pain    Complete by:  As directed      Call MD for:  temperature >100.4    Complete by:  As directed      Diet Carb Modified    Complete by:  As directed      Discharge instructions    Complete by:  As directed   Please follow up with your PCP as scheduled.  You were cared for by a hospitalist during your hospital stay. If you have any questions about your discharge medications or the care you received while you were in the hospital after you are discharged, you can call the unit and asked to speak with the hospitalist on call if the hospitalist that took care of you is not available. Once you are discharged,  your primary care physician will handle any further medical issues. Please note that NO REFILLS for any discharge medications will be authorized once you are discharged, as it is imperative that you return to your primary care physician (or establish a relationship with a primary care physician if you do not have one) for your aftercare needs so that they can reassess your need for medications and monitor your lab values. If you do not have a primary care physician, you can call [phone] for a physician referral.     Increase activity slowly    Complete by:  As directed            ALLERGIES:  Allergies  Allergen Reactions  . Penicillins Hives       . Sulfadiazine Hives     Discharge Medication List as of 09/25/2015 11:53 AM  START taking these medications   Details  ciprofloxacin (CIPRO) 500 MG tablet Take 1 tablet (500 mg total) by mouth 2 (two) times daily. For 4 more days., Starting 09/25/2015, Until Discontinued, Normal      CONTINUE these medications which have NOT CHANGED   Details  acetaminophen (TYLENOL) 500 MG tablet Take 1,000 mg by mouth every 6 (six) hours as needed for mild pain., Until Discontinued, Historical Med    aspirin EC 81 MG tablet Take 81 mg by mouth daily.  , Until Discontinued, Historical Med    atorvastatin (LIPITOR) 20 MG tablet TAKE 1 TABLET (20 MG TOTAL) BY MOUTH DAILY., Normal    azelastine (ASTELIN) 0.1 % nasal spray USE 2 SPRAYS IN EACH NOSTRILS TWICE DAILY AS DIRECTED, Normal    Cholecalciferol (VITAMIN D) 2000 UNITS CAPS Take 2,000 Units by mouth daily., Until Discontinued, Historical Med    Coral Calcium-Magnesium-Vit D [id number] MG-MG-UNIT CAPS Take 1 tablet by mouth daily. , Until Discontinued, Historical Med    glimepiride (AMARYL) 4 MG tablet TAKE 1 TABLET BY MOUTH EVERY DAY WITH BREAKFAST, Normal    losartan (COZAAR) 50 MG tablet TAKE 1 TABLET BY MOUTH EVERY DAY, Normal    metFORMIN (GLUCOPHAGE-XR) 750 MG 24 hr tablet TAKE 1 TABLET  (750 MG TOTAL) BY MOUTH DAILY WITH BREAKFAST., Normal    methotrexate (RHEUMATREX) 2.5 MG tablet TAKE 2 TABLETS BY MOUTH ONCE A WEEK, Normal    metoprolol succinate (TOPROL-XL) 25 MG 24 hr tablet TAKE 1 TABLET (25 MG TOTAL) BY MOUTH DAILY., Normal    nitroGLYCERIN (NITROSTAT) 0.4 MG SL tablet Place 0.4 mg under the tongue every 5 (five) minutes as needed.  , Until Discontinued, Historical Med    predniSONE (DELTASONE) 1 MG tablet Take 4 tablets once daily., Normal    TRAVATAN Z 0.004 % SOLN ophthalmic solution Place 1 drop into both eyes at bedtime. , Starting 05/26/2014, Until Discontinued, Historical Med    triamcinolone cream (KENALOG) 0.1 % Apply 1 application topically as needed (for rash). , Starting 08/29/2012, Until Discontinued, Historical Med       Follow-up Information    Follow up with Eulas Post, MD. Go on 10/05/2015.   Specialty:  Family Medicine   Why:  you have a 10/05/15 1:15 pm one month follow up appointment with Dr Dolores Hoose information:   [place] [phone]       TOTAL DISCHARGE TIME: 62 minutes  [facility] Pager [phone]  09/25/2015, 12:57 PM

## 2015-09-25 NOTE — Discharge Instructions (Signed)

## 2015-09-25 NOTE — Care Management Note (Signed)
Case Management Note  Patient Details  Name: [name] MRN: [id number] Date of Birth: 09/08/1922  Subjective/Objective:          82 yo admitted with Fever, myalgia and generalized weakness          Action/Plan: From home alone. Has help from son.  Expected Discharge Date:   (unknown)               Expected Discharge Plan:  [place]  In-House Referral:  NA  Discharge planning Services  CM Consult  Post Acute Care Choice:  Home Health Choice offered to:  Patient  DME Arranged:    DME Agency:     HH Arranged:  PT [place]:  [place]  Status of Service:  Completed, signed off  Medicare Important Message Given:    Date Medicare IM Given:    Medicare IM give by:    Date Additional Medicare IM Given:    Additional Medicare Important Message give by:     If discussed at [place] of Stay Meetings, dates discussed:    Additional Comments: This CM called pt at home as pt was discharged prior to [place] orders.  I spoke with son Fara Olden who chose [facility] for [facility] services.  [facility] rep contacted for referral. No other DC needs communicated. Lynnell Catalan, RN 09/25/2015, 4:03 PM

## 2015-09-25 NOTE — Evaluation (Signed)
Physical Therapy One Time Evaluation Patient Details Name: [name] MRN: [id number] DOB: [birth year] Today's Date: 09/25/2015   History of Present Illness  82 y.o. female with PMH of diabetes, CAD status post inferior STEMI with bare metal stents 2 to the RCA, hypertension, osteoporosis,  and left eye hemianopsia lives alone who was brought to the hospital with a chief complaint of generalized weakness and myalgias. Patient was found by her daughter at home unable to move and with a laceration to her left lower leg.   Clinical Impression  Patient evaluated by Physical Therapy with no further acute PT needs identified. All education has been completed and the patient has no further questions.  Pt denies falling in her home however her daughters report numerous falls.  Pt lives alone and daughters check in.  Recommend HHPT especially for home safety evaluation.  Recommended and encouraged pt to use RW in home however she did not receive this well and will likely continue furniture walking.  Pt is to d/c home today and can f/u with HHPT. Acute PT is signing off. Thank you for this referral.     Follow Up Recommendations Home health PT    Equipment Recommendations  None recommended by PT    Recommendations for Other Services       Precautions / Restrictions Precautions Precautions: Fall      Mobility  Bed Mobility               General bed mobility comments: pt dressed and in recliner on arrival  Transfers Overall transfer level: Needs assistance Equipment used: None Transfers: Sit to/from Stand Sit to Stand: Min guard         General transfer comment: verbal cues for hand placement to self assist, increased time to rise however no physical assist requried  Ambulation/Gait Ambulation/Gait assistance: Min guard Ambulation Distance (Feet): 200 Feet Assistive device: Rolling walker (2 wheeled) Gait Pattern/deviations: Step-through pattern;Trunk flexed;Wide base of  support     General Gait Details: wide BOS likely to assist with balance, provided RW and pt more steady with UE support, pt and daughter report pt will likely not use at home however encouraged RW for safety, SpO2 95% room air during gait  Stairs            Wheelchair Mobility    Modified Rankin (Stroke Patients Only)       Balance Overall balance assessment: Needs assistance;History of Falls         Standing balance support: No upper extremity supported Standing balance-Leahy Scale: Fair Standing balance comment: pt able to stand and ambulate short distance without UE support however appears unsteady, gait improved with RW/UE support               High Level Balance Comments: pt denies falling in home however daughters reports numerous falls             Pertinent Vitals/Pain Pain Assessment: No/denies pain    Home Living Family/patient expects to be discharged to:: Private residence Living Arrangements: Alone Available Help at Discharge: Family;Available PRN/intermittently Type of Home: House Home Access: Stairs to enter Entrance Stairs-Rails: None Entrance Stairs-Number of Steps: 2-3 Home Layout: One level Home Equipment: Walker - 2 wheels;Cane - single point      Prior Function Level of Independence: Independent with assistive device(s);Independent         Comments: furniture walker in house, uses [facility] for outdoors     [place]  Extremity/Trunk Assessment               Lower Extremity Assessment: Generalized weakness      Cervical / Trunk Assessment: Kyphotic  Communication   Communication: HOH  Cognition Arousal/Alertness: Awake/alert Behavior During Therapy: WFL for tasks assessed/performed Overall Cognitive Status: Within Functional Limits for tasks assessed (pt seems at her baseline, very HOH)                      General Comments      Exercises        Assessment/Plan    PT Assessment All  further PT needs can be met in the next venue of care  PT Diagnosis Difficulty walking;Generalized weakness   PT Problem List Decreased mobility;Decreased balance;Decreased knowledge of use of DME  PT Treatment Interventions     PT Goals (Current goals can be found in the Care Plan section) Acute Rehab PT Goals PT Goal Formulation: All assessment and education complete, DC therapy    Frequency     Barriers to discharge        Co-evaluation               End of Session   Activity Tolerance: Patient tolerated treatment well Patient left: in chair;with call bell/phone within reach;with chair alarm set;with family/visitor present Nurse Communication: Mobility status    Functional Assessment Tool Used: clinical judgement Functional Limitation: Mobility: Walking and moving around Mobility: Walking and Moving Around Current Status (K5997): At least 1 percent but less than 20 percent impaired, limited or restricted Mobility: Walking and Moving Around Goal Status [phone]): At least 1 percent but less than 20 percent impaired, limited or restricted Mobility: Walking and Moving Around Discharge Status [phone]): At least 1 percent but less than 20 percent impaired, limited or restricted    Time: 1120-1137 PT Time Calculation (min) (ACUTE ONLY): 17 min   Charges:   PT Evaluation $PT Eval Low Complexity: 1 Procedure     PT G Codes:   PT G-Codes **NOT FOR INPATIENT CLASS** Functional Assessment Tool Used: clinical judgement Functional Limitation: Mobility: Walking and moving around Mobility: Walking and Moving Around Current Status (Y2334): At least 1 percent but less than 20 percent impaired, limited or restricted Mobility: Walking and Moving Around Goal Status [phone]): At least 1 percent but less than 20 percent impaired, limited or restricted Mobility: Walking and Moving Around Discharge Status [phone]): At least 1 percent but less than 20 percent impaired, limited or restricted     Lurdes Haltiwanger,KATHrine E 09/25/2015, 12:25 PM Carmelia Bake, PT, DPT 09/25/2015 Pager: [phone]

## 2015-09-29 DIAGNOSIS — I252 Old myocardial infarction: Secondary | ICD-10-CM | POA: Diagnosis not present

## 2015-09-29 DIAGNOSIS — M353 Polymyalgia rheumatica: Secondary | ICD-10-CM | POA: Diagnosis not present

## 2015-09-29 DIAGNOSIS — M791 Myalgia: Secondary | ICD-10-CM | POA: Diagnosis not present

## 2015-09-29 DIAGNOSIS — E119 Type 2 diabetes mellitus without complications: Secondary | ICD-10-CM | POA: Diagnosis not present

## 2015-09-29 DIAGNOSIS — H9190 Unspecified hearing loss, unspecified ear: Secondary | ICD-10-CM | POA: Diagnosis not present

## 2015-09-29 DIAGNOSIS — I10 Essential (primary) hypertension: Secondary | ICD-10-CM | POA: Diagnosis not present

## 2015-09-29 DIAGNOSIS — I2510 Atherosclerotic heart disease of native coronary artery without angina pectoris: Secondary | ICD-10-CM | POA: Diagnosis not present

## 2015-09-29 DIAGNOSIS — H5347 Heteronymous bilateral field defects: Secondary | ICD-10-CM | POA: Diagnosis not present

## 2015-09-29 DIAGNOSIS — Q600 Renal agenesis, unilateral: Secondary | ICD-10-CM | POA: Diagnosis not present

## 2015-09-29 DIAGNOSIS — M810 Age-related osteoporosis without current pathological fracture: Secondary | ICD-10-CM | POA: Diagnosis not present

## 2015-09-30 ENCOUNTER — Telehealth: Payer: Self-pay | Admitting: Family Medicine

## 2015-09-30 DIAGNOSIS — M791 Myalgia: Secondary | ICD-10-CM | POA: Diagnosis not present

## 2015-09-30 DIAGNOSIS — I2510 Atherosclerotic heart disease of native coronary artery without angina pectoris: Secondary | ICD-10-CM | POA: Diagnosis not present

## 2015-09-30 DIAGNOSIS — I10 Essential (primary) hypertension: Secondary | ICD-10-CM | POA: Diagnosis not present

## 2015-09-30 DIAGNOSIS — E119 Type 2 diabetes mellitus without complications: Secondary | ICD-10-CM | POA: Diagnosis not present

## 2015-09-30 DIAGNOSIS — M810 Age-related osteoporosis without current pathological fracture: Secondary | ICD-10-CM | POA: Diagnosis not present

## 2015-09-30 DIAGNOSIS — M353 Polymyalgia rheumatica: Secondary | ICD-10-CM | POA: Diagnosis not present

## 2015-09-30 NOTE — Telephone Encounter (Signed)
[name] w/ [facility] needs these three things addressed by Dr Elease Hashimoto:  1. Pt is a diabetic, but does not believe she is a diabetic, and is not eating the right types of foods. Lots of sugary things. Pt does not check her sugar.    Do you want them to do diabetic teaching to pt? She is not sure how much she should press with this issue. 2. Discharge papers states she is on predniSONE (DELTASONE) 1 MG tablet  (4 pills a day)     But her bottle states 5 mg (1 x day) 3. Pt has a wound on right shin and it looks about a inch wide. Looks like it could get infected.  Family would like to add      nursing to care for the wound. Verbal is ok

## 2015-10-01 DIAGNOSIS — I10 Essential (primary) hypertension: Secondary | ICD-10-CM | POA: Diagnosis not present

## 2015-10-01 DIAGNOSIS — M791 Myalgia: Secondary | ICD-10-CM | POA: Diagnosis not present

## 2015-10-01 DIAGNOSIS — M353 Polymyalgia rheumatica: Secondary | ICD-10-CM | POA: Diagnosis not present

## 2015-10-01 DIAGNOSIS — M810 Age-related osteoporosis without current pathological fracture: Secondary | ICD-10-CM | POA: Diagnosis not present

## 2015-10-01 DIAGNOSIS — I2510 Atherosclerotic heart disease of native coronary artery without angina pectoris: Secondary | ICD-10-CM | POA: Diagnosis not present

## 2015-10-01 DIAGNOSIS — E119 Type 2 diabetes mellitus without complications: Secondary | ICD-10-CM | POA: Diagnosis not present

## 2015-10-01 NOTE — Telephone Encounter (Signed)
Diabetic teaching would be great  Because of her advanced age, we are not aiming for extremely tight control  She should be on prednisone 4 mg daily.- we are trying to taper her off this gradually  Okay to add nursing for wound care

## 2015-10-01 NOTE — Telephone Encounter (Signed)
Left  Message for patient to call back

## 2015-10-02 NOTE — Telephone Encounter (Signed)
Left second message to call back.

## 2015-10-05 ENCOUNTER — Ambulatory Visit (INDEPENDENT_AMBULATORY_CARE_PROVIDER_SITE_OTHER): Payer: Medicare Other | Admitting: Family Medicine

## 2015-10-05 ENCOUNTER — Encounter: Payer: Self-pay | Admitting: Family Medicine

## 2015-10-05 VITALS — BP 110/70 | HR 88 | Temp 98.2°F | Wt 129.4 lb

## 2015-10-05 DIAGNOSIS — R296 Repeated falls: Secondary | ICD-10-CM | POA: Diagnosis not present

## 2015-10-05 DIAGNOSIS — M542 Cervicalgia: Secondary | ICD-10-CM

## 2015-10-05 DIAGNOSIS — E1165 Type 2 diabetes mellitus with hyperglycemia: Secondary | ICD-10-CM | POA: Diagnosis not present

## 2015-10-05 DIAGNOSIS — S81801A Unspecified open wound, right lower leg, initial encounter: Secondary | ICD-10-CM | POA: Diagnosis not present

## 2015-10-05 NOTE — Telephone Encounter (Signed)
Patient is here for an appointment 10/05/15

## 2015-10-05 NOTE — Patient Instructions (Signed)
Wound Care °Taking care of your wound properly can help to prevent pain and infection. It can also help your wound to heal more quickly.  °HOW TO CARE FOR YOUR WOUND  °· Take or apply over-the-counter and prescription medicines only as told by your health care provider. °· If you were prescribed antibiotic medicine, take or apply it as told by your health care provider. Do not stop using the antibiotic even if your condition improves. °· Clean the wound each day or as told by your health care provider. °¨ Wash the wound with mild soap and water. °¨ Rinse the wound with water to remove all soap. °¨ Pat the wound dry with a clean towel. Do not rub it. °· There are many different ways to close and cover a wound. For example, a wound can be covered with stitches (sutures), skin glue, or adhesive strips. Follow instructions from your health care provider about: °¨ How to take care of your wound. °¨ When and how you should change your bandage (dressing). °¨ When you should remove your dressing. °¨ Removing whatever was used to close your wound. °· Check your wound every day for signs of infection. Watch for: °¨ Redness, swelling, or pain. °¨ Fluid, blood, or pus. °· Keep the dressing dry until your health care provider says it can be removed. Do not take baths, swim, use a hot tub, or do anything that would put your wound underwater until your health care provider approves. °· Raise (elevate) the injured area above the level of your heart while you are sitting or lying down. °· Do not scratch or pick at the wound. °· Keep all follow-up visits as told by your health care provider. This is important. °SEEK MEDICAL CARE IF: °· You received a tetanus shot and you have swelling, severe pain, redness, or bleeding at the injection site. °· You have a fever. °· Your pain is not controlled with medicine. °· You have increased redness, swelling, or pain at the site of your wound. °· You have fluid, blood, or pus coming from your  wound. °· You notice a bad smell coming from your wound or your dressing. °SEEK IMMEDIATE MEDICAL CARE IF: °· You have a red streak going away from your wound. °  °This information is not intended to replace advice given to you by your health care provider. Make sure you discuss any questions you have with your health care provider. °  °Document Released: 05/10/2008 Document Revised: 12/16/2014 Document Reviewed: 07/28/2014 °Elsevier Interactive Patient Education ©2016 [employer] ° °

## 2015-10-05 NOTE — Progress Notes (Signed)
Pre visit review using our clinic review tool, if applicable. No additional management support is needed unless otherwise documented below in the visit note. 

## 2015-10-05 NOTE — Progress Notes (Signed)
Subjective:    Patient ID: [name], female    DOB: [birth year], 82 y.o.   MRN: [id number]  HPI  patient seen for hospital follow-up. She has chronic problems including history of temporal arteritis, PMR, CAD, type 2 diabetes, hypertension.  Admitted on February ninth. She apparently was found very weak at home with laceration left leg and was taken to ER for further evaluation. No reported head trauma and no history of known syncope. Initial low O2 sat of 83%. This increased to 97% on oxygen. Influenza screen negative. Chest x-ray no infiltrates. Urinalysis unremarkable but subsequent urine culture revealed no growth. She was placed in. On Cipro.   She has a long history of vestibular issues and has been seen ENT, neurology, and multiple physical therapists. She's had vestibular training. She has a walker but does not use this consistently. She's also had some progressive cognitive changes recently.   Complains of frequent left-sided neck pain. Family and obtain recent improve support pillow. She has had some relief with Tylenol and heat. She has intermittent sharp pain left side of neck. No obvious focal weakness.  Past Medical History  Diagnosis Date  . Diabetes mellitus ([place])   . Carpal tunnel syndrome 08/04/2009  . [place], [place] 03/26/2010  . Essential hypertension   . INCONTINENCE, STRESS, FEMALE 10/12/2006  . Osteoporosis, unspecified 10/12/2006  . RHINITIS, ALLERGIC 10/12/2006  . TEMPORAL ARTERITIS 10/12/2006  . UNSTEADY GAIT 01/22/2007  . Kidney stone   . CAD (coronary artery disease)     a. Inferior STEMI s/p BMSx2 to RCA. Coronary anomaly with single coronary ostium arising from right sinus of Valsalva and likely LM course between PA and aorta.  . Single kidney     a. Absence of right kidney by prior imaging.  Marland Kitchen BPPV (benign paroxysmal positional vertigo)   . Multinodular goiter   . 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 H/o Left eye hemianopsia (longstanding).  .  Inferior MI ([place]) 04/21/2011   Past Surgical History  Procedure Laterality Date  . Coronary angioplasty    . Cataract extraction, bilateral      reports that she has never smoked. She has never used smokeless tobacco. She reports that she does not drink alcohol or use illicit drugs. family history includes Heart attack in her father; Stroke in her mother. Allergies  Allergen Reactions  . Penicillins Hives    Has patient had a PCN reaction causing immediate rash, facial/tongue/throat swelling, SOB or lightheadedness with hypotension: Unknown Has patient had a PCN reaction causing severe rash involving mucus membranes or skin necrosis: Unknown Has patient had a PCN reaction that required hospitalization: Unknown Has patient had a PCN reaction occurring within the last 10 years: no  If all of the above answers are "NO", then may proceed with Cephalosporin use.   . Sulfadiazine Hives      Review of Systems  Constitutional: Negative for fever and chills.  Respiratory: Negative for cough and shortness of breath.   Cardiovascular: Negative for chest pain.  Gastrointestinal: Negative for abdominal pain.  Genitourinary: Negative for dysuria.  Musculoskeletal: Positive for neck pain.  Neurological: Negative for syncope.       Objective:   Physical Exam  Constitutional: She appears well-developed and well-nourished.  Patient is very hard of hearing and communication is difficult  Neck: Neck supple.  Cardiovascular: Normal rate and regular rhythm.   Pulmonary/Chest: Effort normal and breath sounds normal. No respiratory distress. She has  no wheezes. She has no rales.  Musculoskeletal: She exhibits no edema.  Neurological: She is alert.  Skin:  Right anterior leg 2 by 2 cm eschar. No surrounding erythema or warmth. Nontender. No drainage.          Assessment & Plan:  #1 right leg wound. No signs of secondary infection. Keep clean with good antibacterial soap once daily and  consider topical antibiotic. Follow-up for secondary infection  #2 left neck pain. Suspect degenerative arthritis. No focal weakness. Conservative treatment with Tylenol. Try to avoid opioids and other high risk medications given her age  #3 history of recurrent falls. Home physical therapy currently set up. She is encouraged use walker at all times  #4 type 2 diabetes. History of poor control. We'll encourage family to monitor sugars at least 3 times weekly. Last A1c 8.4%. Plan repeat in 2 months. Patient would not be able to administer her own insulin if this becomes a requirement

## 2015-10-07 DIAGNOSIS — M791 Myalgia: Secondary | ICD-10-CM | POA: Diagnosis not present

## 2015-10-07 DIAGNOSIS — E119 Type 2 diabetes mellitus without complications: Secondary | ICD-10-CM | POA: Diagnosis not present

## 2015-10-07 DIAGNOSIS — I2510 Atherosclerotic heart disease of native coronary artery without angina pectoris: Secondary | ICD-10-CM | POA: Diagnosis not present

## 2015-10-07 DIAGNOSIS — M810 Age-related osteoporosis without current pathological fracture: Secondary | ICD-10-CM | POA: Diagnosis not present

## 2015-10-07 DIAGNOSIS — M353 Polymyalgia rheumatica: Secondary | ICD-10-CM | POA: Diagnosis not present

## 2015-10-07 DIAGNOSIS — I10 Essential (primary) hypertension: Secondary | ICD-10-CM | POA: Diagnosis not present

## 2015-10-09 DIAGNOSIS — M810 Age-related osteoporosis without current pathological fracture: Secondary | ICD-10-CM | POA: Diagnosis not present

## 2015-10-09 DIAGNOSIS — M791 Myalgia: Secondary | ICD-10-CM | POA: Diagnosis not present

## 2015-10-09 DIAGNOSIS — E119 Type 2 diabetes mellitus without complications: Secondary | ICD-10-CM | POA: Diagnosis not present

## 2015-10-09 DIAGNOSIS — I10 Essential (primary) hypertension: Secondary | ICD-10-CM | POA: Diagnosis not present

## 2015-10-09 DIAGNOSIS — M353 Polymyalgia rheumatica: Secondary | ICD-10-CM | POA: Diagnosis not present

## 2015-10-09 DIAGNOSIS — I2510 Atherosclerotic heart disease of native coronary artery without angina pectoris: Secondary | ICD-10-CM | POA: Diagnosis not present

## 2015-10-14 DIAGNOSIS — E119 Type 2 diabetes mellitus without complications: Secondary | ICD-10-CM | POA: Diagnosis not present

## 2015-10-14 DIAGNOSIS — I2510 Atherosclerotic heart disease of native coronary artery without angina pectoris: Secondary | ICD-10-CM | POA: Diagnosis not present

## 2015-10-14 DIAGNOSIS — M810 Age-related osteoporosis without current pathological fracture: Secondary | ICD-10-CM | POA: Diagnosis not present

## 2015-10-14 DIAGNOSIS — M791 Myalgia: Secondary | ICD-10-CM | POA: Diagnosis not present

## 2015-10-14 DIAGNOSIS — M353 Polymyalgia rheumatica: Secondary | ICD-10-CM | POA: Diagnosis not present

## 2015-10-14 DIAGNOSIS — I10 Essential (primary) hypertension: Secondary | ICD-10-CM | POA: Diagnosis not present

## 2015-10-16 ENCOUNTER — Telehealth: Payer: Self-pay | Admitting: Family Medicine

## 2015-10-16 NOTE — Telephone Encounter (Signed)
[name] with [facility] call to say that pt would like to cancel the rest of her home health visits..    [phone]

## 2015-10-16 NOTE — Telephone Encounter (Signed)
FYI

## 2015-10-27 ENCOUNTER — Telehealth: Payer: Self-pay | Admitting: Family Medicine

## 2015-10-27 DIAGNOSIS — H9193 Unspecified hearing loss, bilateral: Secondary | ICD-10-CM

## 2015-10-27 NOTE — Telephone Encounter (Signed)
[name] is aware that order has been placed for audiology.

## 2015-10-27 NOTE — Telephone Encounter (Signed)
I would recommend follow up with audiologist instead.  She has already seen ENT recently regarding her vertigo.

## 2015-10-27 NOTE — Telephone Encounter (Signed)
Pt is having hearing loss. Pt wears hearing aid. Pt has medicare

## 2015-10-27 NOTE — Telephone Encounter (Signed)
Okay to order ENT referral for hearing loss?

## 2015-10-31 ENCOUNTER — Emergency Department (HOSPITAL_COMMUNITY): Payer: Medicare Other

## 2015-10-31 ENCOUNTER — Emergency Department (HOSPITAL_COMMUNITY)
Admission: EM | Admit: 2015-10-31 | Discharge: 2015-11-01 | Disposition: A | Discharge status: Home / Self Care | Payer: Medicare Other | Attending: Emergency Medicine | Admitting: Emergency Medicine

## 2015-10-31 ENCOUNTER — Encounter (HOSPITAL_COMMUNITY): Payer: Self-pay | Admitting: Emergency Medicine

## 2015-10-31 DIAGNOSIS — E119 Type 2 diabetes mellitus without complications: Secondary | ICD-10-CM | POA: Diagnosis not present

## 2015-10-31 DIAGNOSIS — M810 Age-related osteoporosis without current pathological fracture: Secondary | ICD-10-CM | POA: Insufficient documentation

## 2015-10-31 DIAGNOSIS — S79912A Unspecified injury of left hip, initial encounter: Secondary | ICD-10-CM | POA: Diagnosis present

## 2015-10-31 DIAGNOSIS — Z7984 Long term (current) use of oral hypoglycemic drugs: Secondary | ICD-10-CM | POA: Insufficient documentation

## 2015-10-31 DIAGNOSIS — S80212A Abrasion, left knee, initial encounter: Secondary | ICD-10-CM | POA: Diagnosis not present

## 2015-10-31 DIAGNOSIS — Y9289 Other specified places as the place of occurrence of the external cause: Secondary | ICD-10-CM | POA: Insufficient documentation

## 2015-10-31 DIAGNOSIS — Z880 Allergy status to penicillin: Secondary | ICD-10-CM | POA: Diagnosis not present

## 2015-10-31 DIAGNOSIS — Z7982 Long term (current) use of aspirin: Secondary | ICD-10-CM | POA: Insufficient documentation

## 2015-10-31 DIAGNOSIS — Z7952 Long term (current) use of systemic steroids: Secondary | ICD-10-CM | POA: Diagnosis not present

## 2015-10-31 DIAGNOSIS — Z87442 Personal history of urinary calculi: Secondary | ICD-10-CM | POA: Insufficient documentation

## 2015-10-31 DIAGNOSIS — S7002XA Contusion of left hip, initial encounter: Secondary | ICD-10-CM | POA: Diagnosis not present

## 2015-10-31 DIAGNOSIS — S0990XA Unspecified injury of head, initial encounter: Secondary | ICD-10-CM | POA: Diagnosis not present

## 2015-10-31 DIAGNOSIS — I252 Old myocardial infarction: Secondary | ICD-10-CM | POA: Diagnosis not present

## 2015-10-31 DIAGNOSIS — Y9389 Activity, other specified: Secondary | ICD-10-CM | POA: Insufficient documentation

## 2015-10-31 DIAGNOSIS — W19XXXA Unspecified fall, initial encounter: Secondary | ICD-10-CM

## 2015-10-31 DIAGNOSIS — I2510 Atherosclerotic heart disease of native coronary artery without angina pectoris: Secondary | ICD-10-CM | POA: Diagnosis not present

## 2015-10-31 DIAGNOSIS — R410 Disorientation, unspecified: Secondary | ICD-10-CM | POA: Diagnosis not present

## 2015-10-31 DIAGNOSIS — Y998 Other external cause status: Secondary | ICD-10-CM | POA: Insufficient documentation

## 2015-10-31 DIAGNOSIS — S199XXA Unspecified injury of neck, initial encounter: Secondary | ICD-10-CM | POA: Diagnosis not present

## 2015-10-31 DIAGNOSIS — I10 Essential (primary) hypertension: Secondary | ICD-10-CM | POA: Diagnosis not present

## 2015-10-31 DIAGNOSIS — W1839XA Other fall on same level, initial encounter: Secondary | ICD-10-CM | POA: Diagnosis not present

## 2015-10-31 DIAGNOSIS — Z79899 Other long term (current) drug therapy: Secondary | ICD-10-CM | POA: Insufficient documentation

## 2015-10-31 DIAGNOSIS — S99912A Unspecified injury of left ankle, initial encounter: Secondary | ICD-10-CM | POA: Diagnosis not present

## 2015-10-31 DIAGNOSIS — M25552 Pain in left hip: Secondary | ICD-10-CM | POA: Diagnosis not present

## 2015-10-31 DIAGNOSIS — S8002XA Contusion of left knee, initial encounter: Secondary | ICD-10-CM | POA: Diagnosis not present

## 2015-10-31 DIAGNOSIS — S299XXA Unspecified injury of thorax, initial encounter: Secondary | ICD-10-CM | POA: Diagnosis not present

## 2015-10-31 DIAGNOSIS — M25572 Pain in left ankle and joints of left foot: Secondary | ICD-10-CM | POA: Diagnosis not present

## 2015-10-31 LAB — URINALYSIS, ROUTINE W REFLEX MICROSCOPIC
Bilirubin Urine: NEGATIVE
GLUCOSE, UA: NEGATIVE mg/dL
Ketones, ur: NEGATIVE mg/dL
Nitrite: NEGATIVE
PROTEIN: NEGATIVE mg/dL
Specific Gravity, Urine: 1.021 (ref 1.005–1.030)
pH: 7 (ref 5.0–8.0)

## 2015-10-31 LAB — CBC WITH DIFFERENTIAL/PLATELET
Basophils Absolute: 0 10*3/uL (ref 0.0–0.1)
Basophils Relative: 0 %
EOS PCT: 0 %
Eosinophils Absolute: 0 10*3/uL (ref 0.0–0.7)
HCT: 37.1 % (ref 36.0–46.0)
Hemoglobin: 11.9 g/dL — ABNORMAL LOW (ref 12.0–15.0)
LYMPHS ABS: 2.4 10*3/uL (ref 0.7–4.0)
LYMPHS PCT: 21 %
MCH: 32.3 pg (ref 26.0–34.0)
MCHC: 32.1 g/dL (ref 30.0–36.0)
MCV: 100.8 fL — AB (ref 78.0–100.0)
MONO ABS: 0.6 10*3/uL (ref 0.1–1.0)
MONOS PCT: 5 %
Neutro Abs: 8.7 10*3/uL — ABNORMAL HIGH (ref 1.7–7.7)
Neutrophils Relative %: 74 %
PLATELETS: 219 10*3/uL (ref 150–400)
RBC: 3.68 MIL/uL — AB (ref 3.87–5.11)
RDW: 13.8 % (ref 11.5–15.5)
WBC: 11.7 10*3/uL — AB (ref 4.0–10.5)

## 2015-10-31 LAB — COMPREHENSIVE METABOLIC PANEL
ALBUMIN: 3.7 g/dL (ref 3.5–5.0)
ALT: 21 U/L (ref 14–54)
AST: 33 U/L (ref 15–41)
Alkaline Phosphatase: 61 U/L (ref 38–126)
Anion gap: 10 (ref 5–15)
BUN: 23 mg/dL — AB (ref 6–20)
CHLORIDE: 103 mmol/L (ref 101–111)
CO2: 27 mmol/L (ref 22–32)
Calcium: 10.1 mg/dL (ref 8.9–10.3)
Creatinine, Ser: 0.84 mg/dL (ref 0.44–1.00)
GFR calc Af Amer: >60 mL/min (ref >60)
GFR calc non Af Amer: 59 mL/min — ABNORMAL LOW (ref >60)
GLUCOSE: 127 mg/dL — AB (ref 65–99)
POTASSIUM: 4.6 mmol/L (ref 3.5–5.1)
SODIUM: 140 mmol/L (ref 135–145)
Total Bilirubin: 0.5 mg/dL (ref 0.3–1.2)
Total Protein: 6.7 g/dL (ref 6.5–8.1)

## 2015-10-31 LAB — URINE MICROSCOPIC-ADD ON: Squamous Epithelial / LPF: NONE SEEN

## 2015-10-31 MED ORDER — ACETAMINOPHEN 325 MG PO TABS
650.0000 mg | ORAL_TABLET | Freq: Once | ORAL | Status: AC
Start: 2015-10-31 — End: 2015-10-31
  Administered 2015-10-31: 650 mg via ORAL
  Filled 2015-10-31: qty 2

## 2015-10-31 NOTE — Discharge Instructions (Signed)
Fall Prevention in the Home  Falls can cause injuries and can affect people from all age groups. There are many simple things that you can do to make your home safe and to help prevent falls. WHAT CAN I DO ON THE OUTSIDE OF MY HOME?  Regularly repair the edges of walkways and driveways and fix any cracks.  Remove high doorway thresholds.  Trim any shrubbery on the main path into your home.  Use bright outdoor lighting.  Clear walkways of debris and clutter, including tools and rocks.  Regularly check that handrails are securely fastened and in good repair. Both sides of any steps should have handrails.  Install guardrails along the edges of any raised decks or porches.  Have leaves, snow, and ice cleared regularly.  Use sand or salt on walkways during winter months.  In the garage, clean up any spills right away, including grease or oil spills. WHAT CAN I DO IN THE BATHROOM?  Use night lights.  Install grab bars by the toilet and in the tub and shower. Do not use towel bars as grab bars.  Use non-skid mats or decals on the floor of the tub or shower.  If you need to sit down while you are in the shower, use a plastic, non-slip stool.Marland Kitchen  Keep the floor dry. Immediately clean up any water that spills on the floor.  Remove soap buildup in the tub or shower on a regular basis.  Attach bath mats securely with double-sided non-slip rug tape.  Remove throw rugs and other tripping hazards from the floor. WHAT CAN I DO IN THE BEDROOM?  Use night lights.  Make sure that a bedside light is easy to reach.  Do not use oversized bedding that drapes onto the floor.  Have a firm chair that has side arms to use for getting dressed.  Remove throw rugs and other tripping hazards from the floor. WHAT CAN I DO IN THE KITCHEN?   Clean up any spills right away.  Avoid walking on wet floors.  Place frequently used items in easy-to-reach places.  If you need to reach for something  above you, use a sturdy step stool that has a grab bar.  Keep electrical cables out of the way.  Do not use floor polish or wax that makes floors slippery. If you have to use wax, make sure that it is non-skid floor wax.  Remove throw rugs and other tripping hazards from the floor. WHAT CAN I DO IN THE STAIRWAYS?  Do not leave any items on the stairs.  Make sure that there are handrails on both sides of the stairs. Fix handrails that are broken or loose. Make sure that handrails are as long as the stairways.  Check any carpeting to make sure that it is firmly attached to the stairs. Fix any carpet that is loose or worn.  Avoid having throw rugs at the top or bottom of stairways, or secure the rugs with carpet tape to prevent them from moving.  Make sure that you have a light switch at the top of the stairs and the bottom of the stairs. If you do not have them, have them installed. WHAT ARE SOME OTHER FALL PREVENTION TIPS?  Wear closed-toe shoes that fit well and support your feet. Wear shoes that have rubber soles or low heels.  When you use a stepladder, make sure that it is completely opened and that the sides are firmly locked. Have someone hold the ladder while you  are using it. Do not climb a closed stepladder.  Add color or contrast paint or tape to grab bars and handrails in your home. Place contrasting color strips on the first and last steps.  Use mobility aids as needed, such as canes, walkers, scooters, and crutches.  Turn on lights if it is dark. Replace any light bulbs that burn out.  Set up furniture so that there are clear paths. Keep the furniture in the same spot.  Fix any uneven floor surfaces.  Choose a carpet design that does not hide the edge of steps of a stairway.  Be aware of any and all pets.  Review your medicines with your healthcare provider. Some medicines can cause dizziness or changes in blood pressure, which increase your risk of falling. Talk  with your health care provider about other ways that you can decrease your risk of falls. This may include working with a physical therapist or trainer to improve your strength, balance, and endurance.   This information is not intended to replace advice given to you by your health care provider. Make sure you discuss any questions you have with your health care provider.   Document Released: 07/22/2002 Document Revised: 12/16/2014 Document Reviewed: 09/05/2014 Elsevier Interactive Patient Education 2016 [place] A contusion is a deep bruise. Contusions are the result of a blunt injury to tissues and muscle fibers under the skin. The injury causes bleeding under the skin. The skin overlying the contusion may turn blue, purple, or yellow. Minor injuries will give you a painless contusion, but more severe contusions may stay painful and swollen for a few weeks.  CAUSES  This condition is usually caused by a blow, trauma, or direct force to an area of the body. SYMPTOMS  Symptoms of this condition include:  Swelling of the injured area.  Pain and tenderness in the injured area.  Discoloration. The area may have redness and then turn blue, purple, or yellow. DIAGNOSIS  This condition is diagnosed based on a physical exam and medical history. An X-ray, CT scan, or MRI may be needed to determine if there are any associated injuries, such as broken bones (fractures). TREATMENT  Specific treatment for this condition depends on what area of the body was injured. In general, the best treatment for a contusion is resting, icing, applying pressure to (compression), and elevating the injured area. This is often called the RICE strategy. Over-the-counter anti-inflammatory medicines may also be recommended for pain control.  HOME CARE INSTRUCTIONS   Rest the injured area.  If directed, apply ice to the injured area:  Put ice in a plastic bag.  Place a towel between your skin and the  bag.  Leave the ice on for 20 minutes, 2-3 times per day.  If directed, apply light compression to the injured area using an elastic bandage. Make sure the bandage is not wrapped too tightly. Remove and reapply the bandage as directed by your health care provider.  If possible, raise (elevate) the injured area above the level of your heart while you are sitting or lying down.  Take over-the-counter and prescription medicines only as told by your health care provider. SEEK MEDICAL CARE IF:  Your symptoms do not improve after several days of treatment.  Your symptoms get worse.  You have difficulty moving the injured area. SEEK IMMEDIATE MEDICAL CARE IF:   You have severe pain.  You have numbness in a hand or foot.  Your hand or foot turns pale or  cold.   This information is not intended to replace advice given to you by your health care provider. Make sure you discuss any questions you have with your health care provider.   Document Released: 05/11/2005 Document Revised: 04/22/2015 Document Reviewed: 12/17/2014 Elsevier Interactive Patient Education [employer].

## 2015-10-31 NOTE — ED Notes (Signed)
Pt presents to ED form home c/o 10/10 left hip and left ankle pain after falling outside around 4:30pm today due to dizziness.  Hx of same per niece at bedside.  No deformity or point tenderness noted on assessment.

## 2015-10-31 NOTE — ED Provider Notes (Signed)
CSN: [id number]     Arrival date & time 10/31/15  1928 History   First MD Initiated Contact with Patient 10/31/15 1940     Chief Complaint  Patient presents with  . Fall  . Hip Pain     Patient is a 82 y.o. female presenting with fall and hip pain. The history is provided by the patient. No language interpreter was used.  Fall  Hip Pain   [name] is a 82 y.o. female who presents to the Emergency Department complaining of fall and hip pain.  Hx is provided by the patient.  She was moving her car in the [place] and got out of the car and became "swimmy headed" and fell onto her left hip.  She experienced immediate pain.  She denies head injury or LOC.  A passerby helped her up and got her in the house.  She was then able to walk back to the car and move it again.  She presents now due to continued hip pain and now she can no longer walk on it. The injury occurred about 430pm today. She denies any fevers, chest pain, sob, N/V, abdominal pain.  Sxs are moderate and constant.  She lives alone.  Hx is provided by the patient and her niece.   Past Medical History  Diagnosis Date  . Diabetes mellitus ([place])   . Carpal tunnel syndrome 08/04/2009  . [place], [place] 03/26/2010  . Essential hypertension   . INCONTINENCE, STRESS, FEMALE 10/12/2006  . Osteoporosis, unspecified 10/12/2006  . RHINITIS, ALLERGIC 10/12/2006  . TEMPORAL ARTERITIS 10/12/2006  . UNSTEADY GAIT 01/22/2007  . Kidney stone   . CAD (coronary artery disease)     a. Inferior STEMI s/p BMSx2 to RCA. Coronary anomaly with single coronary ostium arising from right sinus of Valsalva and likely LM course between PA and aorta.  . Single kidney     a. Absence of right kidney by prior imaging.  Marland Kitchen BPPV (benign paroxysmal positional vertigo)   . Multinodular goiter   . 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 H/o Left eye hemianopsia (longstanding).  . Inferior MI ([place]) 04/21/2011   Past Surgical History  Procedure Laterality  Date  . Coronary angioplasty    . Cataract extraction, bilateral    . Abdominal hysterectomy     Family History  Problem Relation Age of Onset  . Heart attack Father   . Stroke Mother    Social History  Substance Use Topics  . Smoking status: Never Smoker   . Smokeless tobacco: Never Used  . Alcohol Use: No   OB History    No data available     Review of Systems  All other systems reviewed and are negative.     Allergies  Penicillins and Sulfadiazine  Home Medications   Prior to Admission medications   Medication Sig Start Date End Date Taking? Authorizing Provider  acetaminophen (TYLENOL) 500 MG tablet Take 1,000 mg by mouth every 6 (six) hours as needed for mild pain.   Yes Historical Provider, MD  aspirin EC 81 MG tablet Take 81 mg by mouth daily.     Yes Historical Provider, MD  atorvastatin (LIPITOR) 20 MG tablet TAKE 1 TABLET (20 MG TOTAL) BY MOUTH DAILY. 08/11/14  Yes Eulas Post, MD  azelastine (ASTELIN) 0.1 % nasal spray USE 2 SPRAYS IN EACH NOSTRILS TWICE DAILY AS DIRECTED Patient taking differently: USE 2 SPRAYS IN EACH NOSTRILS TWICE DAILY AS  Needed for stuffy nose 01/28/15  Yes Eulas Post, MD  Cholecalciferol (VITAMIN D) 2000 UNITS CAPS Take 2,000 Units by mouth daily.   Yes Historical Provider, MD  Coral Calcium-Magnesium-Vit D [phone] MG-MG-UNIT CAPS Take 1 tablet by mouth daily.    Yes Historical Provider, MD  glimepiride (AMARYL) 4 MG tablet TAKE 1 TABLET BY MOUTH EVERY DAY WITH BREAKFAST Patient taking differently: Take 4 mg by mouth daily with breakfast.  06/02/15  Yes Eulas Post, MD  losartan (COZAAR) 50 MG tablet TAKE 1 TABLET BY MOUTH EVERY DAY Patient taking differently: TAKE 50 MG BY MOUTH EVERY DAY 07/22/15  Yes Eulas Post, MD  metFORMIN (GLUCOPHAGE-XR) 750 MG 24 hr tablet TAKE 1 TABLET (750 MG TOTAL) BY MOUTH DAILY WITH BREAKFAST. 09/02/15  Yes Eulas Post, MD  methotrexate (RHEUMATREX) 2.5 MG tablet TAKE 2  TABLETS BY MOUTH ONCE A WEEK Patient taking differently: TAKE 5 MG BY MOUTH ONCE A WEEK on TUESDAY 09/03/15  Yes Eulas Post, MD  metoprolol succinate (TOPROL-XL) 25 MG 24 hr tablet TAKE 1 TABLET (25 MG TOTAL) BY MOUTH DAILY. 07/22/15  Yes Eulas Post, MD  nitroGLYCERIN (NITROSTAT) 0.4 MG SL tablet Place 0.4 mg under the tongue every 5 (five) minutes as needed.     Yes Historical Provider, MD  predniSONE (DELTASONE) 5 MG tablet Take 12.5 mg by mouth daily. 10/16/15  Yes Historical Provider, MD  TRAVATAN Z 0.004 % SOLN ophthalmic solution Place 1 drop into both eyes at bedtime.  05/26/14  Yes Historical Provider, MD  triamcinolone cream (KENALOG) 0.1 % Apply 1 application topically as needed (for rash).  08/29/12  Yes Eulas Post, MD  predniSONE (DELTASONE) 1 MG tablet Take 4 tablets once daily. Patient not taking: Reported on 10/31/2015 09/02/15   Eulas Post, MD   BP 148/71 mmHg  Pulse 78  Temp(Src) 98.4 F (36.9 C) (Oral)  Resp 17  Ht 5\' 5"  (1.651 m)  Wt 110 lb (49.896 kg)  BMI 18.31 kg/m2  SpO2 92% Physical Exam  Constitutional: She is oriented to person, place, and time. She appears well-developed and well-nourished.  HENT:  Head: Normocephalic and atraumatic.  Cardiovascular: Normal rate and regular rhythm.   No murmur heard. Pulmonary/Chest: Effort normal and breath sounds normal. No respiratory distress.  Abdominal: Soft. There is no tenderness. There is no rebound and no guarding.  Musculoskeletal:  2+ DP pulses in BLE.  No appreciable tenderness on left hip exam but pt is unable to lift or range the left hip without pain.  Healing abrasion over the left distal leg with mild local swelling, no tenderness.  Small abrasion over left knee without tenderness or swelling.    Neurological: She is alert and oriented to person, place, and time.  Mildly confused.   Skin: Skin is warm and dry.  Psychiatric: She has a normal mood and affect. Her behavior is normal.   Nursing note and vitals reviewed.   ED Course  Procedures (including critical care time) Labs Review Labs Reviewed  COMPREHENSIVE METABOLIC PANEL - Abnormal; Notable for the following:    Glucose, Bld 127 (*)    BUN 23 (*)    GFR calc non Af Amer 59 (*)    All other components within normal limits  CBC WITH DIFFERENTIAL/PLATELET - Abnormal; Notable for the following:    WBC 11.7 (*)    RBC 3.68 (*)    Hemoglobin 11.9 (*)    MCV 100.8 (*)  Neutro Abs 8.7 (*)    All other components within normal limits  URINALYSIS, ROUTINE W REFLEX MICROSCOPIC (NOT AT [facility]) - Abnormal; Notable for the following:    Hgb urine dipstick SMALL (*)    Leukocytes, UA MODERATE (*)    All other components within normal limits  URINE MICROSCOPIC-ADD ON - Abnormal; Notable for the following:    Bacteria, UA RARE (*)    All other components within normal limits  URINE CULTURE    Imaging Review Dg Chest 1 View  10/31/2015  CLINICAL DATA:  [place] this afternoon.  Lightheadedness. EXAM: CHEST 1 VIEW COMPARISON:  09/24/2015 FINDINGS: Mild upper mediastinal widening is due to tortuous vasculature, unchanged. The lungs are clear. No displaced fractures. No large effusions. No pneumothorax. IMPRESSION: No acute findings. Electronically Signed   By: Andreas Newport M.D.   On: 10/31/2015 20:58   Dg Ankle Complete Left  10/31/2015  CLINICAL DATA:  Recent fall left ankle pain, initial encounter EXAM: LEFT ANKLE COMPLETE - 3+ VIEW COMPARISON:  None. FINDINGS: There is no evidence of fracture, dislocation, or joint effusion. There is no evidence of arthropathy or other focal bone abnormality. Soft tissues are unremarkable. IMPRESSION: No acute abnormality noted. Electronically Signed   By: Inez Catalina M.D.   On: 10/31/2015 20:57   Ct Head Wo Contrast  10/31/2015  CLINICAL DATA:  Status post fall. Concern for head or cervical spine injury. Initial encounter. EXAM: CT HEAD WITHOUT CONTRAST CT CERVICAL SPINE WITHOUT  CONTRAST TECHNIQUE: Multidetector CT imaging of the head and cervical spine was performed following the standard protocol without intravenous contrast. Multiplanar CT image reconstructions of the cervical spine were also generated. COMPARISON:  CT of the head performed 06/20/2014 FINDINGS: CT HEAD FINDINGS There is no evidence of acute infarction, or intra- or extra-axial hemorrhage on CT. Prominence of the ventricles and sulci reflects moderate cortical volume loss. Mild cerebellar atrophy is suggested. A 1.9 cm partially calcified meningioma is again noted at the right frontal lobe, stable from 2015. The brainstem and fourth ventricle are within normal limits. The basal ganglia are unremarkable in appearance. The cerebral hemispheres demonstrate grossly normal gray-white differentiation. No mass effect or midline shift is seen. There is no evidence of fracture; visualized osseous structures are unremarkable in appearance. The orbits are within normal limits. The paranasal sinuses and mastoid air cells are well-aerated. No significant soft tissue abnormalities are seen. CT CERVICAL SPINE FINDINGS There is no evidence of fracture or subluxation. There is slight chronic loss of height at vertebral bodies C6 and C7. Multilevel disc space narrowing is noted along the cervical spine, with scattered anterior and posterior disc osteophyte complexes and underlying facet disease. There is mild grade 1 anterolisthesis of C2 on C3, and mild grade 1 anterolisthesis of C7 on T1. Prevertebral soft tissues are within normal limits. A 3.0 cm hypodensity is noted at the right thyroid lobe. A smaller 1.2 cm nodule is noted at the left isthmus. Mild scarring is noted at the lung apices. Scattered calcification is noted along the carotid bifurcations bilaterally. IMPRESSION: 1. No evidence of traumatic intracranial injury or fracture. 2. No evidence of fracture or subluxation along the cervical spine. 3. Moderate cortical volume loss  noted. 4. Stable 1.9 cm right frontal partially calcified meningioma again noted. 5. Mild diffuse degenerative change along the cervical spine, with slight chronic loss of height at C6 and C7. 6. 3.0 cm hypodensity at the right thyroid lobe, and smaller 1.2 cm hypodensity at the  left thyroid isthmus. Would evaluate further with thyroid ultrasound, as deemed clinically appropriate. If patient is clinically hyperthyroid, consider nuclear medicine thyroid uptake and scan. 7. Mild scarring at the lung apices. 8. Scattered calcification along the carotid bifurcations bilaterally. Carotid ultrasound could be considered for further evaluation, as deemed clinically appropriate. Electronically Signed   By: Garald Balding M.D.   On: 10/31/2015 21:14   Ct Cervical Spine Wo Contrast  10/31/2015  CLINICAL DATA:  Status post fall. Concern for head or cervical spine injury. Initial encounter. EXAM: CT HEAD WITHOUT CONTRAST CT CERVICAL SPINE WITHOUT CONTRAST TECHNIQUE: Multidetector CT imaging of the head and cervical spine was performed following the standard protocol without intravenous contrast. Multiplanar CT image reconstructions of the cervical spine were also generated. COMPARISON:  CT of the head performed 06/20/2014 FINDINGS: CT HEAD FINDINGS There is no evidence of acute infarction, or intra- or extra-axial hemorrhage on CT. Prominence of the ventricles and sulci reflects moderate cortical volume loss. Mild cerebellar atrophy is suggested. A 1.9 cm partially calcified meningioma is again noted at the right frontal lobe, stable from 2015. The brainstem and fourth ventricle are within normal limits. The basal ganglia are unremarkable in appearance. The cerebral hemispheres demonstrate grossly normal gray-white differentiation. No mass effect or midline shift is seen. There is no evidence of fracture; visualized osseous structures are unremarkable in appearance. The orbits are within normal limits. The paranasal sinuses  and mastoid air cells are well-aerated. No significant soft tissue abnormalities are seen. CT CERVICAL SPINE FINDINGS There is no evidence of fracture or subluxation. There is slight chronic loss of height at vertebral bodies C6 and C7. Multilevel disc space narrowing is noted along the cervical spine, with scattered anterior and posterior disc osteophyte complexes and underlying facet disease. There is mild grade 1 anterolisthesis of C2 on C3, and mild grade 1 anterolisthesis of C7 on T1. Prevertebral soft tissues are within normal limits. A 3.0 cm hypodensity is noted at the right thyroid lobe. A smaller 1.2 cm nodule is noted at the left isthmus. Mild scarring is noted at the lung apices. Scattered calcification is noted along the carotid bifurcations bilaterally. IMPRESSION: 1. No evidence of traumatic intracranial injury or fracture. 2. No evidence of fracture or subluxation along the cervical spine. 3. Moderate cortical volume loss noted. 4. Stable 1.9 cm right frontal partially calcified meningioma again noted. 5. Mild diffuse degenerative change along the cervical spine, with slight chronic loss of height at C6 and C7. 6. 3.0 cm hypodensity at the right thyroid lobe, and smaller 1.2 cm hypodensity at the left thyroid isthmus. Would evaluate further with thyroid ultrasound, as deemed clinically appropriate. If patient is clinically hyperthyroid, consider nuclear medicine thyroid uptake and scan. 7. Mild scarring at the lung apices. 8. Scattered calcification along the carotid bifurcations bilaterally. Carotid ultrasound could be considered for further evaluation, as deemed clinically appropriate. Electronically Signed   By: Garald Balding M.D.   On: 10/31/2015 21:14   Dg Knee Complete 4 Views Left  10/31/2015  CLINICAL DATA:  Status post fall, with left knee pain and bruising. Initial encounter. EXAM: LEFT KNEE - COMPLETE 4+ VIEW COMPARISON:  None. FINDINGS: There is no evidence of fracture or dislocation.  Mild joint space narrowing is noted at the medial compartment, with slight cortical irregularity. Marginal osteophytes are noted at the medial compartment. No significant joint effusion is seen. Scattered vascular calcifications are seen. IMPRESSION: 1. No evidence of fracture or dislocation. 2. 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 3. Scattered vascular calcifications seen. Electronically Signed   By: Garald Balding M.D.   On: 10/31/2015 20:59   Dg Hip Unilat With Pelvis 2-3 Views Left  10/31/2015  CLINICAL DATA:  Pt fell this afternoon, states she was dizzy. C/o pain in left knee, left hip, and left ankle. Abrasions/bruising noted to left anterior knee. Wound also noted to anterior/medial left ankle. EXAM: DG HIP (WITH OR WITHOUT PELVIS) 2-3V LEFT COMPARISON:  07/02/2011, 08/19/2006 FINDINGS: Chronic fractures of the left superior and inferior pubic ramus. Degenerative changes are seen in the lower spine and both hips. There is no acute fracture. No subluxation. IMPRESSION: 1. Chronic fracture of the left superior and inferior pubic rami. 2.  No evidence for acute  abnormality. Electronically Signed   By: Nolon Nations M.D.   On: 10/31/2015 20:58   I have personally reviewed and evaluated these images and lab results as part of my medical decision-making.   EKG Interpretation None      MDM   Final diagnoses:  Fall, initial encounter  Contusion, hip, left, initial encounter   Pt here For evaluation of injuries following a fall. She states she had a swimmy headedness that happened to trigger her fall. This is a chronic and ongoing issue for the patient. She has left hip pain on range of motion of the leg but none on palpation. Imaging demonstrates old fractures of the superior and inferior pubic ramus on the left. In the department she is able to ambulate with a walker. There is no evidence of acute infectious process or trigger for her fall. Her niece will be able to stay with her for  the next few days to make sure she is safe at home. Discussed with patient and niece option for home health care and physical therapy and she has recently completed a course and does not desire additional PT at this time. Discussed following up with her primary care provider to establish additional outpatient treatment options. Home care and return precautions were discussed.    Quintella Reichert, MD 11/01/15 [phone]

## 2015-10-31 NOTE — ED Notes (Signed)
UNABLE TO COLLECT LABS PATIENT IS IN XRAY

## 2015-11-02 ENCOUNTER — Telehealth: Payer: Self-pay | Admitting: Family Medicine

## 2015-11-02 DIAGNOSIS — H9193 Unspecified hearing loss, bilateral: Secondary | ICD-10-CM

## 2015-11-02 LAB — URINE CULTURE: Culture: 3000

## 2015-11-02 NOTE — Telephone Encounter (Signed)
Daughter will call me back in the morning.

## 2015-11-02 NOTE — Telephone Encounter (Signed)
[name], daughter in law called to advise pt had another fall on Sat.,went to the ED. They did extensive xrays, CT scan, and did not find anything.  Gave her a tylenol and sent her home. Pt cannot come in today. [name] wanted Dr Elease Hashimoto to know. Pt is sore, and stiff, could not get in and out of bed without assistance. Family is looking into private care to stay with pt. Pt cannot come in today.    Pt has not heard anything from anyone conerning audiology referral. Please advise.  Please call [name] as pt will not answer the phone and will not take anyone's advice.

## 2015-11-02 NOTE — Telephone Encounter (Signed)
Looks like audiology referral has already been made.

## 2015-11-09 ENCOUNTER — Other Ambulatory Visit: Payer: Self-pay | Admitting: Family Medicine

## 2015-11-09 ENCOUNTER — Ambulatory Visit (INDEPENDENT_AMBULATORY_CARE_PROVIDER_SITE_OTHER): Payer: Medicare Other | Admitting: Family Medicine

## 2015-11-09 VITALS — BP 130/64 | HR 95 | Temp 97.5°F | Ht 65.0 in | Wt 123.8 lb

## 2015-11-09 DIAGNOSIS — R296 Repeated falls: Secondary | ICD-10-CM

## 2015-11-09 DIAGNOSIS — E1165 Type 2 diabetes mellitus with hyperglycemia: Secondary | ICD-10-CM

## 2015-11-09 DIAGNOSIS — K5900 Constipation, unspecified: Secondary | ICD-10-CM

## 2015-11-09 DIAGNOSIS — S81802A Unspecified open wound, left lower leg, initial encounter: Secondary | ICD-10-CM

## 2015-11-09 MED ORDER — ATORVASTATIN CALCIUM 20 MG PO TABS: ORAL_TABLET | ORAL | Status: DC

## 2015-11-09 NOTE — Telephone Encounter (Signed)
This need to be put in as a referral was not enter as a referral it was put in as an order could not see it  That's why it was not scheduled .please enter as a referral I will send it to [facility]

## 2015-11-09 NOTE — Progress Notes (Signed)
Subjective:    Patient ID: [name], female    DOB: Dec 14, 1922, 82 y.o.   MRN: [id number]  HPI  Hospital follow-up:   Patient had recent fall at home unwitnessed in her 62. She apparently was moving her car in the [place] and got out of the car and became "swimmy headed "and fell onto her left hip. Someone next-door realized that she was in need of help and  helped her back up. She has history of frequent falls.   Patient was seen in ER and had multiple imaging studies including CT head, CT cervical spine, 1 view chest x-ray, left ankle, left knee, and pelvic films which did not show any acute abnormality. Evidence for old left-sided pelvic fracture.   She is very hard of hearing and communication is difficult. No complaints of acute confusion. No headache. She has had some constipation over the past several days. Does not drink a lot of fluids according to family  Multiple chronic problems including chronic intermittent vertigo, high risk of falls, history of temporal arteritis, type 2 diabetes, osteoporosis, CAD, hyperlipidemia, hypertension. Family very concerned about her overall decline and high-risk for falls. We had recently set up home physical therapy but patient refused after 5 visits. They had recommended further therapy.   Family currently has sitter in place 24/7 but are thinking that she needs assisted living level of care in the very least at this point in time. Patient has been very reluctant to consider this.   Left leg wound. Apparently injured weeks ago from trauma when she was walking in the yard. No purulent drainage or signs of infection  Past Medical History  Diagnosis Date  . Diabetes mellitus ([place])   . Carpal tunnel syndrome 08/04/2009  . [place], [place] 03/26/2010  . Essential hypertension   . INCONTINENCE, STRESS, FEMALE 10/12/2006  . Osteoporosis, unspecified 10/12/2006  . RHINITIS, ALLERGIC 10/12/2006  . TEMPORAL ARTERITIS 10/12/2006  . UNSTEADY  GAIT 01/22/2007  . Kidney stone   . CAD (coronary artery disease)     a. Inferior STEMI s/p BMSx2 to RCA. Coronary anomaly with single coronary ostium arising from right sinus of Valsalva and likely LM course between PA and aorta.  . Single kidney     a. Absence of right kidney by prior imaging.  Marland Kitchen BPPV (benign paroxysmal positional vertigo)   . Multinodular goiter   . 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 H/o Left eye hemianopsia (longstanding).  . Inferior MI ([place]) 04/21/2011   Past Surgical History  Procedure Laterality Date  . Coronary angioplasty    . Cataract extraction, bilateral    . Abdominal hysterectomy      reports that she has never smoked. She has never used smokeless tobacco. She reports that she does not drink alcohol or use illicit drugs. family history includes Heart attack in her father; Stroke in her mother. Allergies  Allergen Reactions  . Penicillins Hives    Has patient had a PCN reaction causing immediate rash, facial/tongue/throat swelling, SOB or lightheadedness with hypotension: Unknown Has patient had a PCN reaction causing severe rash involving mucus membranes or skin necrosis: Unknown Has patient had a PCN reaction that required hospitalization: Unknown Has patient had a PCN reaction occurring within the last 10 years: no  If all of the above answers are "NO", then may proceed with Cephalosporin use.   . Sulfadiazine Hives      Review of Systems  review of systems  is difficult to obtain from patient secondary to hearing deficits. Otherwise, history obtained from daughter and as per history of present illness.    Objective:   Physical Exam  Constitutional: She appears well-developed and well-nourished.  HENT:  Mouth/Throat: Oropharynx is clear and moist.  Neck: Neck supple.  Cardiovascular: Normal rate and regular rhythm.   Pulmonary/Chest: Effort normal and breath sounds normal. No respiratory distress. She has no wheezes. She has no rales.    Musculoskeletal: She exhibits no edema.  Neurological: She is alert.  Skin:  1 and 1/2 cm superficial wound left anterior leg. No surrounding erythema or purulence.          Assessment & Plan:   #1 history recurrent falls.   Patient's become progressively more debilitated over time. We talked to daughter today about possible higher level of care. They're considering assisted living and will start search for possible facilities soon. Mother is very resistant. FL-2 form completed.     #2 constipation history. Drink more fluids. Increase oral fiber intake. Consider MiraLAX as needed.   #3 left leg wound. No signs of secondary infection.    Proper wound care discussed  #4  Type 2 diabetes. History of poor control. Last A1c 8.4%. We decided against aggressive control given her age and multiple comorbidities

## 2015-11-09 NOTE — Telephone Encounter (Signed)
Entered. Thank You.

## 2015-11-09 NOTE — Telephone Encounter (Signed)
Can you resubmit the Audiology referral on [place] please. The daughter has not been contacted yet. Thanks.

## 2015-11-09 NOTE — Progress Notes (Signed)
Pre visit review using our clinic review tool, if applicable. No additional management support is needed unless otherwise documented below in the visit note. 

## 2015-11-09 NOTE — Addendum Note (Signed)
Addended by: Elio Forget on: 11/09/2015 03:06 PM   Modules accepted: Orders

## 2015-11-11 ENCOUNTER — Telehealth: Payer: Self-pay | Admitting: Family Medicine

## 2015-11-11 NOTE — Telephone Encounter (Signed)
If she is feeling better after BM do not see need to go to ER at this time.

## 2015-11-11 NOTE — Telephone Encounter (Signed)
[facility] - Client [facility]  Patient Name: [name]  DOB: Feb 16, 1923    Initial Comment Caller states patient was seen in the office- has not had a BM since last Thursday- was told to get Miralax but has still not had a BM.    Nurse Assessment  Nurse: Wayne Sever, RN, Tillie Rung Date/Time (Eastern Time): 11/11/2015 10:03:02 AM  Confirm and document reason for call. If symptomatic, describe symptoms. You must click the next button to save text entered. ---Caller is her daughter in law. She states that she is not with her, but she is with her home health worker at home. She is has not pooped since last Thursday and is in pain per caller. She saw her primary MD on Monday and they have started her on Miralax, lots of water and prune juice and she still cannot poop.  Has the patient traveled out of the country within the last 30 days? ---Not Applicable  Does the patient have any new or worsening symptoms? ---Yes  Will a triage be completed? ---Yes  Related visit to physician within the last 2 weeks? ---Yes  Does the PT have any chronic conditions? (i.e. diabetes, asthma, etc.) ---Yes  List chronic conditions. ---MI several years ago has a stent, Type 2 Diabetes, HTN,  Is this a behavioral health or substance abuse call? ---No     Guidelines    Guideline Title Affirmed Question Affirmed Notes  Constipation [1] Constant abdominal pain AND [2] present > 2 hours    Final Disposition User   See Physician within 4 Hours (or PCP triage) Wayne Sever, RN, Tillie Rung    Comments  No appointments left in EPIC for today   Referrals  [facility] - ED   Disagree/Comply: Comply

## 2015-11-11 NOTE — Telephone Encounter (Signed)
Will close this note.  See other note from today.

## 2015-11-11 NOTE — Telephone Encounter (Signed)
[facility] - Client [facility] Patient Name: [name] DOB: 1923-04-05 Initial Comment Caller states patient just had a BM- was told to go to the ER- and now thy need to know if she still needs to go to the ER Nurse Assessment Nurse: Markus Daft, RN, Sherre Poot Date/Time Eilene Ghazi Time): 11/11/2015 11:31:29 AM Confirm and document reason for call. If symptomatic, describe symptoms. You must click the next button to save text entered. ---Caller, dtr in law, states patient just had a BM - good size, soft/formed, and feels better. Not in any distress, and denies abdominal pain. Drinking water, prune juice, and using MiraLax. She was told to go to the ER earlier today since no available appts, and now thy need to know if she still needs to go to the ER? Has the patient traveled out of the country within the last 30 days? ---Not Applicable Does the patient have any new or worsening symptoms? ---No Please document clinical information provided and list any resource used. ---RN advised not necessary to go to ER now since she has had her BM and is not having pain or other s/s. Caller verb. understanding. Guidelines Guideline Title Affirmed Question Affirmed Notes Final Disposition User Clinical Call [place], RN, [employer]

## 2015-11-11 NOTE — Telephone Encounter (Signed)
Fara Olden (son, POA) notified no need to go to the hospital if feeling better.  Advised to sign DPR on next visit to add Lattie Haw.

## 2015-11-11 NOTE — Telephone Encounter (Signed)
Please review other [facility] note.

## 2015-11-11 NOTE — Telephone Encounter (Signed)
Tried to reach the pt.  Was able to reach [place].  Advised no DPR on file so I'm not able to speak to her.  Will try again at a later time.

## 2015-12-01 NOTE — Telephone Encounter (Signed)
[name] please close this encounter

## 2015-12-03 ENCOUNTER — Ambulatory Visit (INDEPENDENT_AMBULATORY_CARE_PROVIDER_SITE_OTHER): Payer: Medicare Other | Admitting: Family Medicine

## 2015-12-03 VITALS — BP 128/60 | HR 74 | Temp 97.5°F | Ht 65.0 in | Wt 125.5 lb

## 2015-12-03 DIAGNOSIS — S81802D Unspecified open wound, left lower leg, subsequent encounter: Secondary | ICD-10-CM

## 2015-12-03 DIAGNOSIS — K5900 Constipation, unspecified: Secondary | ICD-10-CM

## 2015-12-03 DIAGNOSIS — E1165 Type 2 diabetes mellitus with hyperglycemia: Secondary | ICD-10-CM | POA: Diagnosis not present

## 2015-12-03 DIAGNOSIS — M316 Other giant cell arteritis: Secondary | ICD-10-CM | POA: Diagnosis not present

## 2015-12-03 LAB — HEMOGLOBIN A1C: HEMOGLOBIN A1C: 7.6 % — AB (ref 4.6–6.5)

## 2015-12-03 LAB — SEDIMENTATION RATE: Sed Rate: 28 mm/hr — ABNORMAL HIGH (ref 0–22)

## 2015-12-03 NOTE — Progress Notes (Signed)
Pre visit review using our clinic review tool, if applicable. No additional management support is needed unless otherwise documented below in the visit note. 

## 2015-12-03 NOTE — Patient Instructions (Signed)
We will call you with lab work IF sed rate stable, we will plan to decrease prednisone by 1 mg per month until off.

## 2015-12-03 NOTE — Progress Notes (Signed)
Subjective:    Patient ID: [name], female    DOB: 26-Jun-1923, 82 y.o.   MRN: [id number]  HPI  patient seen for medical follow-up  Refer to recent note. We addressed several items today   Recent constipation issues. She started taking some prune juice which helps. Rarely taking MiraLAX. Usually has a bowel movement about every other day. No excessive straining. She is cautioned about using too much pressure in just because of her diabetes   Recent left leg wound. This occurred after running into a stick out in the grass. She's not had any redness or drainage. She has some bilateral leg edema but no leg pain.   History of temporal arteritis going back several years. Currently on low-dose prednisone 4 mg daily. Last sedimentation rate stable. Denies any recent headache for visual changes.   Type 2 diabetes exacerbated by prednisone. Not monitoring blood sugars regularly. Last A1c 3 months ago 8.4%. Remains on metformin 750 mg extended release 1 daily and glimepiride 4 milligrams once daily. No recent hypoglycemic symptoms.   Extremely hard of hearing. We have recommended audiology reassessment and they went recently and are looking at possible hearing aids. Family had been considering placement options because of her recent falls but at this point they have elected not to pursue that further  Past Medical History  Diagnosis Date  . Diabetes mellitus ([place])   . Carpal tunnel syndrome 08/04/2009  . [place], [place] 03/26/2010  . Essential hypertension   . INCONTINENCE, STRESS, FEMALE 10/12/2006  . Osteoporosis, unspecified 10/12/2006  . RHINITIS, ALLERGIC 10/12/2006  . TEMPORAL ARTERITIS 10/12/2006  . UNSTEADY GAIT 01/22/2007  . Kidney stone   . CAD (coronary artery disease)     a. Inferior STEMI s/p BMSx2 to RCA. Coronary anomaly with single coronary ostium arising from right sinus of Valsalva and likely LM course between PA and aorta.  . Single kidney     a. Absence of right kidney  by prior imaging.  Marland Kitchen BPPV (benign paroxysmal positional vertigo)   . Multinodular goiter   . 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 H/o Left eye hemianopsia (longstanding).  . Inferior MI ([place]) 04/21/2011   Past Surgical History  Procedure Laterality Date  . Coronary angioplasty    . Cataract extraction, bilateral    . Abdominal hysterectomy      reports that she has never smoked. She has never used smokeless tobacco. She reports that she does not drink alcohol or use illicit drugs. family history includes Heart attack in her father; Stroke in her mother. Allergies  Allergen Reactions  . Penicillins Hives    Has patient had a PCN reaction causing immediate rash, facial/tongue/throat swelling, SOB or lightheadedness with hypotension: Unknown Has patient had a PCN reaction causing severe rash involving mucus membranes or skin necrosis: Unknown Has patient had a PCN reaction that required hospitalization: Unknown Has patient had a PCN reaction occurring within the last 10 years: no  If all of the above answers are "NO", then may proceed with Cephalosporin use.   . Sulfadiazine Hives      Review of Systems  Constitutional: Positive for fatigue.  Eyes: Negative for visual disturbance.  Respiratory: Negative for cough, chest tightness, shortness of breath and wheezing.   Cardiovascular: Positive for leg swelling. Negative for chest pain and palpitations.  Endocrine: Negative for polydipsia and polyuria.  Neurological: Negative for dizziness, seizures, syncope, weakness, light-headedness and headaches.  Psychiatric/Behavioral: Negative for confusion.  Objective:   Physical Exam  Constitutional:  Patient is very hard of hearing which based medication difficult. Daughter is with her and she assisted with communication  Neck: Neck supple.  Cardiovascular: Normal rate and regular rhythm.   Pulmonary/Chest: Effort normal and breath sounds normal. No respiratory distress. She  has no wheezes. She has no rales.  Musculoskeletal: She exhibits edema.  Lymphadenopathy:    She has no cervical adenopathy.  Neurological: She is alert.  Skin:  Several small scattered bruises on the forearms. Left leg eschar approximately 1 cm diameter. No surrounding erythema or warmth.          Assessment & Plan:   #1 history of temporal arteritis. Repeat sedimentation rate. If stable try tapering off by decreasing 1 mg per month until completely off prednisone   # 2 type 2 diabetes. She's not had good control recently. Would not aim for extremely tight control because of her age and multiple comorbidities. Recheck A1c   #3 constipation. Improved with MiraLAX. Cautioned about excessive prune juice secondary to her diabetes   #4 left leg wound healing well and no signs of secondary infection

## 2015-12-29 ENCOUNTER — Other Ambulatory Visit: Payer: Self-pay | Admitting: Family Medicine

## 2016-02-04 ENCOUNTER — Other Ambulatory Visit: Payer: Self-pay | Admitting: *Deleted

## 2016-02-04 MED ORDER — AZELASTINE HCL 0.1 % NA SOLN: NASAL | Status: DC

## 2016-02-04 NOTE — Telephone Encounter (Signed)
Received fax request from pharmacy requesting 90 day supply

## 2016-02-19 ENCOUNTER — Ambulatory Visit (INDEPENDENT_AMBULATORY_CARE_PROVIDER_SITE_OTHER): Payer: Medicare Other | Admitting: Family Medicine

## 2016-02-19 VITALS — BP 100/60

## 2016-02-19 DIAGNOSIS — E1165 Type 2 diabetes mellitus with hyperglycemia: Secondary | ICD-10-CM

## 2016-02-19 DIAGNOSIS — I10 Essential (primary) hypertension: Secondary | ICD-10-CM

## 2016-02-19 DIAGNOSIS — M316 Other giant cell arteritis: Secondary | ICD-10-CM | POA: Diagnosis not present

## 2016-02-19 LAB — POCT GLYCOSYLATED HEMOGLOBIN (HGB A1C): Hemoglobin A1C: 8.4

## 2016-02-19 NOTE — Patient Instructions (Signed)
Continue tapering prednisone down 1 mg per month until off  Let's plan routine follow-up in 4 months and sooner as needed

## 2016-02-19 NOTE — Progress Notes (Signed)
Subjective:    Patient ID: [name], female    DOB: 07/08/1923, 82 y.o.   MRN: [id number]  HPI  Medical follow-up  Type 2 diabetes. Currently treated with metformin and Amaryl. Does not check blood sugars at home. No hypoglycemia. Last A1c 7.6%. She is tapering off prednisone. She's been on this several years with history of temporal arteritis. Currently on prednisone 2 mg daily.  Long-standing history of temporal arteritis. She's been difficult to taper off prednisone the past. Currently stable on 2 mg.  She has some chronic mild headaches which are unchanged.  No recent visual changes.  Hypertension treated with losartan and metoprolol. No dizziness. No falls. Still does some light yard work.  She has history of CAD. No recent chest pains. Remains on Lipitor for dyslipidemia.  Past Medical History  Diagnosis Date  . Diabetes mellitus ([place])   . Carpal tunnel syndrome 08/04/2009  . [place], [place] 03/26/2010  . Essential hypertension   . INCONTINENCE, STRESS, FEMALE 10/12/2006  . Osteoporosis, unspecified 10/12/2006  . RHINITIS, ALLERGIC 10/12/2006  . TEMPORAL ARTERITIS 10/12/2006  . UNSTEADY GAIT 01/22/2007  . Kidney stone   . CAD (coronary artery disease)     a. Inferior STEMI s/p BMSx2 to RCA. Coronary anomaly with single coronary ostium arising from right sinus of Valsalva and likely LM course between PA and aorta.  . Single kidney     a. Absence of right kidney by prior imaging.  Marland Kitchen BPPV (benign paroxysmal positional vertigo)   . Multinodular goiter   . 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 H/o Left eye hemianopsia (longstanding).  . Inferior MI ([place]) 04/21/2011   Past Surgical History  Procedure Laterality Date  . Coronary angioplasty    . Cataract extraction, bilateral    . Abdominal hysterectomy      reports that she has never smoked. She has never used smokeless tobacco. She reports that she does not drink alcohol or use illicit drugs. family history  includes Heart attack in her father; Stroke in her mother. Allergies  Allergen Reactions  . Penicillins Hives    Has patient had a PCN reaction causing immediate rash, facial/tongue/throat swelling, SOB or lightheadedness with hypotension: Unknown Has patient had a PCN reaction causing severe rash involving mucus membranes or skin necrosis: Unknown Has patient had a PCN reaction that required hospitalization: Unknown Has patient had a PCN reaction occurring within the last 10 years: no  If all of the above answers are "NO", then may proceed with Cephalosporin use.   . Sulfadiazine Hives        Review of Systems  Constitutional: Positive for fatigue. Negative for fever, chills, appetite change and unexpected weight change.  Eyes: Negative for visual disturbance.  Respiratory: Negative for cough, chest tightness, shortness of breath and wheezing.   Cardiovascular: Negative for chest pain, palpitations and leg swelling.  Genitourinary: Negative for dysuria.  Neurological: Negative for dizziness, seizures, syncope, weakness and light-headedness. Headaches: see HPI.       Objective:   Physical Exam  Constitutional: She appears well-developed and well-nourished.  Patient is very hard of hearing which makes communication difficult  Neck: Neck supple. No thyromegaly present.  Cardiovascular: Normal rate and regular rhythm.   Pulmonary/Chest: Effort normal and breath sounds normal. No respiratory distress. She has no wheezes. She has no rales.  Musculoskeletal: She exhibits no edema.  Neurological: She is alert.  Skin: No rash noted.  Assessment & Plan:  #1 type 2 diabetes.  Improved control by recent A1c. Recheck A1c today. Our goal especially given her age and multiple comorbidities is less than 8.  #2 Hypertension. Stable and at goal  #3 history of temporal arteritis. Continue slow taper of prednisone 1 mg monthly until off.  Eulas Post MD [facility]

## 2016-02-19 NOTE — Progress Notes (Signed)
Pre visit review using our clinic review tool, if applicable. No additional management support is needed unless otherwise documented below in the visit note. 

## 2016-03-07 ENCOUNTER — Other Ambulatory Visit: Payer: Self-pay | Admitting: Emergency Medicine

## 2016-03-07 MED ORDER — METFORMIN HCL ER 750 MG PO TB24: ORAL_TABLET | ORAL | 1 refills | Status: DC

## 2016-03-08 ENCOUNTER — Other Ambulatory Visit: Payer: Self-pay | Admitting: Family Medicine

## 2016-04-14 ENCOUNTER — Ambulatory Visit (INDEPENDENT_AMBULATORY_CARE_PROVIDER_SITE_OTHER): Payer: Medicare Other | Admitting: Adult Health

## 2016-04-14 VITALS — BP 138/62 | Temp 98.6°F | Ht 65.0 in | Wt 122.4 lb

## 2016-04-14 DIAGNOSIS — S0300XA Dislocation of jaw, unspecified side, initial encounter: Secondary | ICD-10-CM

## 2016-04-14 DIAGNOSIS — M26602 Left temporomandibular joint disorder, unspecified: Secondary | ICD-10-CM | POA: Diagnosis not present

## 2016-04-14 MED ORDER — NAPROXEN 500 MG PO TABS: 500.0000 mg | ORAL_TABLET | Freq: Two times a day (BID) | ORAL | 0 refills | Status: DC

## 2016-04-14 NOTE — Progress Notes (Signed)
Subjective:    Patient ID: [name], female    DOB: 09/29/1922, 82 y.o.   MRN: [id number]  HPI  82 year old female, patient of Dr. Elease Hashimoto. She presents to the office today with her daughter for the acute complaint of left sided ear pain. She reports that she has had this ear pain for the last two days ( ?). The pain radiates from her ear to her jaw line. Is worse with opening mouth and chewing food. She is worried that her new hearing aids are causing this issue.   Family denies any fevers, feeling acutely ill, or drainage from left ear.   She has been taking Tylenol with mild relief.   Review of Systems  Constitutional: Negative.   HENT: Positive for ear pain and hearing loss (chronic). Negative for ear discharge, postnasal drip, rhinorrhea, sinus pressure, sore throat and trouble swallowing.   Respiratory: Negative.   Cardiovascular: Negative.   Musculoskeletal: Negative.   Skin: Negative.   Neurological: Negative.   All other systems reviewed and are negative.  Past Medical History:  Diagnosis Date  . BPPV (benign paroxysmal positional vertigo)   . CAD (coronary artery disease)    a. Inferior STEMI s/p BMSx2 to RCA. Coronary anomaly with single coronary ostium arising from right sinus of Valsalva and likely LM course between PA and aorta.  . Carpal tunnel syndrome 08/04/2009  . Diabetes mellitus ([place])   . Essential hypertension   . [place], [place] 03/26/2010  . INCONTINENCE, STRESS, FEMALE 10/12/2006  . Inferior MI ([place]) 04/21/2011  . 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 H/o Left eye hemianopsia (longstanding).  . Kidney stone   . Multinodular goiter   . Osteoporosis, unspecified 10/12/2006  . RHINITIS, ALLERGIC 10/12/2006  . Single kidney    a. Absence of right kidney by prior imaging.  . TEMPORAL ARTERITIS 10/12/2006  . UNSTEADY GAIT 01/22/2007    Social History   Social History  . Marital status: Widowed    Spouse name: N/A  . Number of children: N/A    . Years of education: N/A   Occupational History  . Not on file.   Social History Main Topics  . Smoking status: Never Smoker  . Smokeless tobacco: Never Used  . Alcohol use No  . Drug use: No  . Sexual activity: No   Other Topics Concern  . Not on file   Social History Narrative  . No narrative on file    Past Surgical History:  Procedure Laterality Date  . ABDOMINAL HYSTERECTOMY    . CATARACT EXTRACTION, BILATERAL    . CORONARY ANGIOPLASTY      Family History  Problem Relation Age of Onset  . Heart attack Father   . Stroke Mother     Allergies  Allergen Reactions  . Penicillins Hives    Has patient had a PCN reaction causing immediate rash, facial/tongue/throat swelling, SOB or lightheadedness with hypotension: Unknown Has patient had a PCN reaction causing severe rash involving mucus membranes or skin necrosis: Unknown Has patient had a PCN reaction that required hospitalization: Unknown Has patient had a PCN reaction occurring within the last 10 years: no  If all of the above answers are "NO", then may proceed with Cephalosporin use.   . Sulfadiazine Hives    Current Outpatient Prescriptions on File Prior to Visit  Medication Sig Dispense Refill  . acetaminophen (TYLENOL) 500 MG tablet Take 1,000 mg by mouth every 6 (six)  hours as needed for mild pain.    Marland Kitchen aspirin EC 81 MG tablet Take 81 mg by mouth daily.      Marland Kitchen atorvastatin (LIPITOR) 20 MG tablet TAKE 1 TABLET (20 MG TOTAL) BY MOUTH DAILY. 90 tablet 3  . azelastine (ASTELIN) 0.1 % nasal spray USE 2 SPRAYS IN EACH NOSTRILS TWICE DAILY AS DIRECTED 90 mL 1  . Cholecalciferol (VITAMIN D) 2000 UNITS CAPS Take 2,000 Units by mouth daily.    . [place] Calcium-Magnesium-Vit D [phone] MG-MG-UNIT CAPS Take 1 tablet by mouth daily.     Marland Kitchen glimepiride (AMARYL) 4 MG tablet TAKE 1 TABLET BY MOUTH EVERY DAY WITH BREAKFAST (Patient taking differently: Take 4 mg by mouth daily with breakfast. ) 90 tablet 3  . losartan  (COZAAR) 50 MG tablet TAKE 1 TABLET BY MOUTH EVERY DAY 30 tablet 5  . metFORMIN (GLUCOPHAGE-XR) 750 MG 24 hr tablet TAKE 1 TABLET (750 MG TOTAL) BY MOUTH DAILY WITH BREAKFAST. 90 tablet 1  . methotrexate (RHEUMATREX) 2.5 MG tablet TAKE 2 TABLETS BY MOUTH ONCE A WEEK 26 tablet 1  . metoprolol succinate (TOPROL-XL) 25 MG 24 hr tablet TAKE 1 TABLET (25 MG TOTAL) BY MOUTH DAILY. 90 tablet 3  . nitroGLYCERIN (NITROSTAT) 0.4 MG SL tablet Place 0.4 mg under the tongue every 5 (five) minutes as needed.      . TRAVATAN Z 0.004 % SOLN ophthalmic solution Place 1 drop into both eyes at bedtime.   4  . triamcinolone cream (KENALOG) 0.1 % Apply 1 application topically as needed (for rash).      No current facility-administered medications on file prior to visit.     BP 138/62   Temp 98.6 F (37 C) (Oral)   Ht 5\' 5"  (1.651 m)   Wt 122 lb 6.4 oz (55.5 kg)   BMI 20.37 kg/m       Objective:   Physical Exam  Constitutional: She is oriented to person, place, and time. She appears well-developed and well-nourished. No distress.  HENT:  Head: Normocephalic and atraumatic.  Right Ear: External ear normal.  Left Ear: External ear normal.  Nose: Nose normal.  Mouth/Throat: Oropharynx is clear and moist. No oropharyngeal exudate.  Small well healing abrasion in left ear canal. No signs of infection noted in ear canal or TM  Eyes: Conjunctivae and EOM are normal. Pupils are equal, round, and reactive to light. Right eye exhibits no discharge. Left eye exhibits no discharge.  Neck: Normal range of motion. Neck supple.  Cardiovascular: Normal rate, regular rhythm, normal heart sounds and intact distal pulses.   No murmur heard. Pulmonary/Chest: Effort normal and breath sounds normal.  Musculoskeletal: She exhibits tenderness. She exhibits no edema or deformity.  Pain with palpation along left TMJ. She has limited ROM opening her mouth and when doing horizontal and vertical movement with her jaw she endorses  pain. No ' popping' sensation noted but patient could not open jaw far enough.   Neurological: She is alert and oriented to person, place, and time.  Skin: Skin is warm and dry. No rash noted. She is not diaphoretic. No erythema. No pallor.  Psychiatric: She has a normal mood and affect. Her behavior is normal. Judgment and thought content normal.  Nursing note and vitals reviewed.      Assessment & Plan:  1. TMJ (dislocation of temporomandibular joint), initial encounter - No signs of otitis media or externa. Symptoms more consistent with TMJ.  - She is already taking prednisone  for hx of temporal arteritis.  - naproxen (NAPROSYN) 500 MG tablet; Take 1 tablet (500 mg total) by mouth 2 (two) times daily with a meal.  Dispense: 30 tablet; Refill: 0 - Decided against muscle relaxer do to age.  - Follow up if no improvement  Dorothyann Peng, NP

## 2016-04-14 NOTE — Patient Instructions (Addendum)
It was great meeting you today.   You do not have an ear infection. You do have a small, well healing sore in the left ear canal but I do not think this is causing your pain.   Your exam is consistent with TMJ  I would advised Motrin 600 mg ( 3 pills) every 8 hours for the next 3 days.   Follow up if no improvement  Temporomandibular Joint Syndrome Temporomandibular joint (TMJ) syndrome is a condition that affects the joints between your jaw and your skull. The TMJs are located near your ears and allow your jaw to open and close. These joints and the nearby muscles are involved in all movements of the jaw. People with TMJ syndrome have pain in the area of these joints and muscles. Chewing, biting, or other movements of the jaw can be difficult or painful. TMJ syndrome can be caused by various things. In many cases, the condition is mild and goes away within a few weeks. For some people, the condition can become a long-term problem. CAUSES Possible causes of TMJ syndrome include:  Grinding your teeth or clenching your jaw. Some people do this when they are under stress.  Arthritis.  Injury to the jaw.  Head or neck injury.  Teeth or dentures that are not aligned well. In some cases, the cause of TMJ syndrome may not be known. SIGNS AND SYMPTOMS The most common symptom is an aching pain on the side of the head in the area of the TMJ. Other symptoms may include:  Pain when moving your jaw, such as when chewing or biting.  Being unable to open your jaw all the way.  Making a clicking sound when you open your mouth.  Headache.  Earache.  Neck or shoulder pain. DIAGNOSIS Diagnosis can usually be made based on your symptoms, your medical history, and a physical exam. Your health care provider may check the range of motion of your jaw. Imaging tests, such as X-rays or an MRI, are sometimes done. You may need to see your dentist to determine if your teeth and jaw are lined up  correctly. TREATMENT TMJ syndrome often goes away on its own. If treatment is needed, the options may include:  Eating soft foods and applying ice or heat.  Medicines to relieve pain or inflammation.  Medicines to relax the muscles.  A splint, bite plate, or mouthpiece to prevent teeth grinding or jaw clenching.  Relaxation techniques or counseling to help reduce stress.  Transcutaneous electrical nerve stimulation (TENS). This helps to relieve pain by applying an electrical current through the skin.  Acupuncture. This is sometimes helpful to relieve pain.  Jaw surgery. This is rarely needed. HOME CARE INSTRUCTIONS  Take medicines only as directed by your health care provider.  Eat a soft diet if you are having trouble chewing.  Apply ice to the painful area.  Put ice in a plastic bag.  Place a towel between your skin and the bag.  Leave the ice on for 20 minutes, 2-3 times a day.  Apply a warm compress to the painful area as directed.  Massage your jaw area and perform any jaw stretching exercises as recommended by your health care provider.  If you were given a mouthpiece or bite plate, wear it as directed.  Avoid foods that require a lot of chewing. Do not chew gum.  Keep all follow-up visits as directed by your health care provider. This is important. SEEK MEDICAL CARE IF:  You  are having trouble eating.  You have new or worsening symptoms. SEEK IMMEDIATE MEDICAL CARE IF:  Your jaw locks open or closed.   This information is not intended to replace advice given to you by your health care provider. Make sure you discuss any questions you have with your health care provider.   Document Released: 04/26/2001 Document Revised: 08/22/2014 Document Reviewed: 03/06/2014 Elsevier Interactive Patient Education [employer].

## 2016-04-21 ENCOUNTER — Other Ambulatory Visit: Payer: Self-pay | Admitting: Family Medicine

## 2016-04-24 NOTE — Telephone Encounter (Signed)
She should already be off prednisone.  We may need to check with her daughter to confirm.   Was taking only 2 mg daily last visit.  Don't know if this request is an automatic refill request from pharmacy.  Make sure she has not been taking the 5 mg dose.

## 2016-04-24 NOTE — Telephone Encounter (Signed)
She should already be off prednisone.  We had given her taper regimen last visit.

## 2016-04-30 ENCOUNTER — Other Ambulatory Visit: Payer: Self-pay | Admitting: Adult Health

## 2016-04-30 DIAGNOSIS — S0300XA Dislocation of jaw, unspecified side, initial encounter: Secondary | ICD-10-CM

## 2016-05-01 NOTE — Telephone Encounter (Signed)
Refused refill. If she continues to have pain she should follow up with Dr. Elease Hashimoto.

## 2016-05-02 NOTE — Telephone Encounter (Signed)
I left a message for the pt to return my call. 

## 2016-05-09 ENCOUNTER — Other Ambulatory Visit: Payer: Self-pay | Admitting: Family Medicine

## 2016-05-19 ENCOUNTER — Other Ambulatory Visit: Payer: Self-pay | Admitting: Family Medicine

## 2016-06-19 ENCOUNTER — Other Ambulatory Visit: Payer: Self-pay | Admitting: Family Medicine

## 2016-07-03 ENCOUNTER — Other Ambulatory Visit: Payer: Self-pay | Admitting: Family Medicine

## 2016-08-05 ENCOUNTER — Other Ambulatory Visit: Payer: Self-pay | Admitting: Family Medicine

## 2016-08-31 ENCOUNTER — Other Ambulatory Visit: Payer: Self-pay | Admitting: Family Medicine

## 2016-09-23 ENCOUNTER — Ambulatory Visit (INDEPENDENT_AMBULATORY_CARE_PROVIDER_SITE_OTHER): Payer: Medicare Other | Admitting: Family Medicine

## 2016-09-23 VITALS — BP 120/54 | HR 73 | Temp 97.8°F | Wt 119.3 lb

## 2016-09-23 DIAGNOSIS — K5900 Constipation, unspecified: Secondary | ICD-10-CM

## 2016-09-23 DIAGNOSIS — I10 Essential (primary) hypertension: Secondary | ICD-10-CM

## 2016-09-23 DIAGNOSIS — R4189 Other symptoms and signs involving cognitive functions and awareness: Secondary | ICD-10-CM | POA: Diagnosis not present

## 2016-09-23 DIAGNOSIS — M316 Other giant cell arteritis: Secondary | ICD-10-CM

## 2016-09-23 DIAGNOSIS — E1165 Type 2 diabetes mellitus with hyperglycemia: Secondary | ICD-10-CM

## 2016-09-23 NOTE — Progress Notes (Signed)
Subjective:     Patient ID: [name], female   DOB: [birth year], 83 y.o.   MRN: [id number]  HPI Patient is here accompanied by her daughter. She is very hard of hearing which makes communication very difficult. Apparently, she's had about one week history of some constipation with no bowel movement for the past 5 days. She has complained of some diffuse lower abdominal discomfort. No burning with urination. No fever. No chills. No bloody stools. She takes Metamucil and tried some prune juice without much success. She has a history of sometimes having to strain with bowel movement. No nausea or vomiting.  She is very hard of hearing. She has hearing aids but they apparently are not functioning well at all. Daughter states that sometimes she has been "hearing voices" " through her hearing aids. In general, she's had decline over the past several years and even though she still lives independently they have considered possible nursing home placement. She's had more frequent falls.  Patient was very confused about medications and cannot confirm her medications today. She has past history of temporal arteritis, hypertension, CAD, type 2 diabetes, osteoporosis, chronic recurrent vertigo. She should be tapered completely off prednisone this point but patient seemed to think that she was still taking some prednisone.  Past Medical History:  Diagnosis Date  . BPPV (benign paroxysmal positional vertigo)   . CAD (coronary artery disease)    a. Inferior STEMI s/p BMSx2 to RCA. Coronary anomaly with single coronary ostium arising from right sinus of Valsalva and likely LM course between PA and aorta.  . Carpal tunnel syndrome 08/04/2009  . Diabetes mellitus ([place])   . Essential hypertension   . [place], [place] 03/26/2010  . INCONTINENCE, STRESS, FEMALE 10/12/2006  . Inferior MI ([place]) 04/21/2011  . 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 H/o Left eye hemianopsia (longstanding).  . Kidney stone   .  Multinodular goiter   . Osteoporosis, unspecified 10/12/2006  . RHINITIS, ALLERGIC 10/12/2006  . Single kidney    a. Absence of right kidney by prior imaging.  . TEMPORAL ARTERITIS 10/12/2006  . UNSTEADY GAIT 01/22/2007   Past Surgical History:  Procedure Laterality Date  . ABDOMINAL HYSTERECTOMY    . CATARACT EXTRACTION, BILATERAL    . CORONARY ANGIOPLASTY      reports that she has never smoked. She has never used smokeless tobacco. She reports that she does not drink alcohol or use drugs. family history includes Heart attack in her father; Stroke in her mother. Allergies  Allergen Reactions  . Penicillins Hives    Has patient had a PCN reaction causing immediate rash, facial/tongue/throat swelling, SOB or lightheadedness with hypotension: Unknown Has patient had a PCN reaction causing severe rash involving mucus membranes or skin necrosis: Unknown Has patient had a PCN reaction that required hospitalization: Unknown Has patient had a PCN reaction occurring within the last 10 years: no  If all of the above answers are "NO", then may proceed with Cephalosporin use.   . Sulfadiazine Hives     Review of Systems  Constitutional: Negative for chills and fever.  Respiratory: Negative for shortness of breath.   Cardiovascular: Negative for chest pain.  Gastrointestinal: Positive for constipation. Negative for abdominal pain, diarrhea, nausea and vomiting.  Genitourinary: Negative for dysuria.  Neurological: Negative for syncope and headaches.   Review of systems very difficult from patient as she is very hearing    Objective:   Physical Exam  Constitutional: She  appears well-developed and well-nourished.  Cardiovascular: Normal rate and regular rhythm.   Pulmonary/Chest: Effort normal and breath sounds normal. No respiratory distress. She has no wheezes. She has no rales.  Abdominal: Soft. Bowel sounds are normal. She exhibits no distension and no mass. There is no tenderness. There  is no rebound and no guarding.  Genitourinary:  Genitourinary Comments: She has some small firm pellet-like stool in the rectal vault but no impaction. No rectal masses palpated.  Musculoskeletal: She exhibits no edema.  Neurological: She is alert.  Psychiatric:  We attempted MMSE but patient unable to cooperate because of difficulty hearing       Assessment:     #1 constipation. No impaction by exam  #2 cognitive impairment. We were unable to perform adequate MMSE because of her difficulty hearing  #3 past history of temporal arteritis. She is supposed to be tapered completely well prednisone but apparently may still be taking up to 2 mg daily and daughter will confirm  #4 type 2 diabetes.      Plan:     -Reassess in 2 weeks -Increase fluid intake -Trial of MiraLAX daily if needed -Touch base if she's not had bowel movement by early next week -We discussed with daughter that she will likely need higher low care very soon given her overall physical and cognitive decline -Repeat labs at follow-up  Eulas Post MD [facility] at [facility]

## 2016-09-23 NOTE — Progress Notes (Signed)
Pre visit review using our clinic review tool, if applicable. No additional management support is needed unless otherwise documented below in the visit note. 

## 2016-09-23 NOTE — Patient Instructions (Signed)
Constipation, Adult Constipation is when a person has fewer bowel movements in a week than normal, has difficulty having a bowel movement, or has stools that are dry, hard, or larger than normal. Constipation may be caused by an underlying condition. It may become worse with age if a person takes certain medicines and does not take in enough fluids. Follow these instructions at home: Eating and drinking  Eat foods that have a lot of fiber, such as fresh fruits and vegetables, whole grains, and beans.  Limit foods that are high in fat, low in fiber, or overly processed, such as french fries, hamburgers, cookies, candies, and soda.  Drink enough fluid to keep your urine clear or pale yellow. General instructions  Exercise regularly or as told by your health care provider.  Go to the restroom when you have the urge to go. Do not hold it in.  Take over-the-counter and prescription medicines only as told by your health care provider. These include any fiber supplements.  Practice pelvic floor retraining exercises, such as deep breathing while relaxing the lower abdomen and pelvic floor relaxation during bowel movements.  Watch your condition for any changes.  Keep all follow-up visits as told by your health care provider. This is important. Contact a health care provider if:  You have pain that gets worse.  You have a fever.  You do not have a bowel movement after 4 days.  You vomit.  You are not hungry.  You lose weight.  You are bleeding from the anus.  You have thin, pencil-like stools. Get help right away if:  You have a fever and your symptoms suddenly get worse.  You leak stool or have blood in your stool.  Your abdomen is bloated.  You have severe pain in your abdomen.  You feel dizzy or you faint. This information is not intended to replace advice given to you by your health care provider. Make sure you discuss any questions you have with your health care  provider. Document Released: 04/29/2004 Document Revised: 02/19/2016 Document Reviewed: 01/20/2016 Elsevier Interactive Patient Education  2017 [employer].  Try over the counter Miralax Drink more fluids Eat plenty of fiber and continue with the Metamucil.

## 2016-10-03 ENCOUNTER — Other Ambulatory Visit: Payer: Self-pay | Admitting: Family Medicine

## 2016-10-06 ENCOUNTER — Encounter (HOSPITAL_COMMUNITY): Payer: Self-pay | Admitting: Emergency Medicine

## 2016-10-06 ENCOUNTER — Emergency Department (HOSPITAL_COMMUNITY): Payer: Medicare Other

## 2016-10-06 ENCOUNTER — Emergency Department (HOSPITAL_COMMUNITY)
Admission: EM | Admit: 2016-10-06 | Discharge: 2016-10-06 | Disposition: A | Discharge status: Home / Self Care | Payer: Medicare Other | Attending: Emergency Medicine | Admitting: Emergency Medicine

## 2016-10-06 DIAGNOSIS — Z7984 Long term (current) use of oral hypoglycemic drugs: Secondary | ICD-10-CM | POA: Diagnosis not present

## 2016-10-06 DIAGNOSIS — E119 Type 2 diabetes mellitus without complications: Secondary | ICD-10-CM | POA: Insufficient documentation

## 2016-10-06 DIAGNOSIS — M7989 Other specified soft tissue disorders: Secondary | ICD-10-CM | POA: Diagnosis not present

## 2016-10-06 DIAGNOSIS — I10 Essential (primary) hypertension: Secondary | ICD-10-CM | POA: Diagnosis not present

## 2016-10-06 DIAGNOSIS — Y999 Unspecified external cause status: Secondary | ICD-10-CM | POA: Diagnosis not present

## 2016-10-06 DIAGNOSIS — I2510 Atherosclerotic heart disease of native coronary artery without angina pectoris: Secondary | ICD-10-CM | POA: Insufficient documentation

## 2016-10-06 DIAGNOSIS — S52572A Other intraarticular fracture of lower end of left radius, initial encounter for closed fracture: Secondary | ICD-10-CM | POA: Diagnosis not present

## 2016-10-06 DIAGNOSIS — S59912A Unspecified injury of left forearm, initial encounter: Secondary | ICD-10-CM | POA: Diagnosis present

## 2016-10-06 DIAGNOSIS — T148XXA Other injury of unspecified body region, initial encounter: Secondary | ICD-10-CM | POA: Diagnosis not present

## 2016-10-06 DIAGNOSIS — Z7982 Long term (current) use of aspirin: Secondary | ICD-10-CM | POA: Insufficient documentation

## 2016-10-06 DIAGNOSIS — Y93H1 Activity, digging, shoveling and raking: Secondary | ICD-10-CM | POA: Diagnosis not present

## 2016-10-06 DIAGNOSIS — Y929 Unspecified place or not applicable: Secondary | ICD-10-CM | POA: Diagnosis not present

## 2016-10-06 DIAGNOSIS — S52502A Unspecified fracture of the lower end of left radius, initial encounter for closed fracture: Secondary | ICD-10-CM | POA: Insufficient documentation

## 2016-10-06 DIAGNOSIS — I252 Old myocardial infarction: Secondary | ICD-10-CM | POA: Diagnosis not present

## 2016-10-06 DIAGNOSIS — Z79899 Other long term (current) drug therapy: Secondary | ICD-10-CM | POA: Diagnosis not present

## 2016-10-06 DIAGNOSIS — W010XXA Fall on same level from slipping, tripping and stumbling without subsequent striking against object, initial encounter: Secondary | ICD-10-CM | POA: Diagnosis not present

## 2016-10-06 MED ORDER — LIDOCAINE HCL (PF) 2 % IJ SOLN
10.0000 mL | Freq: Once | INTRAMUSCULAR | Status: AC
  Administered 2016-10-06: 10 mL
  Filled 2016-10-06: qty 10

## 2016-10-06 MED ORDER — HYDROCODONE-ACETAMINOPHEN 5-325 MG PO TABS: 0.5000 | ORAL_TABLET | ORAL | 0 refills | Status: DC | PRN

## 2016-10-06 MED ORDER — FENTANYL CITRATE (PF) 100 MCG/2ML IJ SOLN
50.0000 ug | Freq: Once | INTRAMUSCULAR | Status: AC
  Administered 2016-10-06: 50 ug via INTRAVENOUS
  Filled 2016-10-06: qty 2

## 2016-10-06 MED ORDER — HYDROCODONE-ACETAMINOPHEN 5-325 MG PO TABS
1.0000 | ORAL_TABLET | Freq: Once | ORAL | Status: AC
  Administered 2016-10-06: 1 via ORAL
  Filled 2016-10-06: qty 1

## 2016-10-06 NOTE — ED Provider Notes (Signed)
[facility] DEPT Provider Note   CSN: [id number] Arrival date & time: 10/06/16  1723     History   Chief Complaint Chief Complaint  Patient presents with  . Fall    HPI Umaima MIN COPELAND is a 83 y.o. female.  HPI  83 year old female presents after a fall and left wrist injury. 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 She did not hit her head or lose consciousness. Patient chronically has intermittent vertigo and has been having some of this today but she does not that she fell due to the dizziness. She is having severe pain in the left wrist. EMS placed a splint. No weakness or numbness.  Past Medical History:  Diagnosis Date  . BPPV (benign paroxysmal positional vertigo)   . CAD (coronary artery disease)    a. Inferior STEMI s/p BMSx2 to RCA. Coronary anomaly with single coronary ostium arising from right sinus of Valsalva and likely LM course between PA and aorta.  . Carpal tunnel syndrome 08/04/2009  . Diabetes mellitus ([place])   . Essential hypertension   . [place], [place] 03/26/2010  . INCONTINENCE, STRESS, FEMALE 10/12/2006  . Inferior MI ([place]) 04/21/2011  . 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 H/o Left eye hemianopsia (longstanding).  . Kidney stone   . Multinodular goiter   . Osteoporosis, unspecified 10/12/2006  . RHINITIS, ALLERGIC 10/12/2006  . Single kidney    a. Absence of right kidney by prior imaging.  . TEMPORAL ARTERITIS 10/12/2006  . UNSTEADY GAIT 01/22/2007    Patient Active Problem List   Diagnosis Date Noted  . Recurrent falls 10/05/2015  . Acute encephalopathy 09/24/2015  . Fever, myalgia, and generalized weakness 09/24/2015  . Hyperlipidemia 06/02/2015  . PMR (polymyalgia rheumatica) (HCC) 12/17/2014  . Vertigo 10/12/2014  . Essential hypertension   . Single kidney   . Poorly controlled type 2 diabetes mellitus ([place]) 02/26/2013  . CAD (coronary artery disease) 04/21/2011  . [place],  [place] 03/26/2010  . CARPAL TUNNEL SYNDROME 08/04/2009  . UNSTEADY GAIT 01/22/2007  . TEMPORAL ARTERITIS 10/12/2006  . RHINITIS, ALLERGIC 10/12/2006  . OSTEOPOROSIS, UNSPECIFIED 10/12/2006    Past Surgical History:  Procedure Laterality Date  . ABDOMINAL HYSTERECTOMY    . CATARACT EXTRACTION, BILATERAL    . CORONARY ANGIOPLASTY      OB History    No data available       Home Medications    Prior to Admission medications   Medication Sig Start Date End Date Taking? Authorizing Provider  acetaminophen (TYLENOL) 500 MG tablet Take 1,000 mg by mouth every 6 (six) hours as needed for mild pain.   Yes Historical Provider, MD  aspirin EC 81 MG tablet Take 81 mg by mouth daily.     Yes Historical Provider, MD  atorvastatin (LIPITOR) 20 MG tablet TAKE 1 TABLET (20 MG TOTAL) BY MOUTH DAILY. 11/09/15  Yes Eulas Post, MD  azelastine (ASTELIN) 0.1 % nasal spray USE 2 SPRAYS IN EACH NOSTRILTWICE DAILY 10/03/16  Yes Eulas Post, MD  Cholecalciferol (VITAMIN D) 2000 UNITS CAPS Take 2,000 Units by mouth daily.   Yes Historical Provider, MD  [place] Calcium-Magnesium-Vit D [phone] MG-MG-UNIT CAPS Take 1 tablet by mouth daily.    Yes Historical Provider, MD  glimepiride (AMARYL) 4 MG tablet TAKE 1 TABLET BY MOUTH EVERY DAY WITH BREAKFAST 08/05/16  Yes Eulas Post, MD  losartan (COZAAR) 50 MG tablet  TAKE 1 TABLET BY MOUTH EVERY DAY 05/19/16  Yes Eulas Post, MD  metFORMIN (GLUCOPHAGE-XR) 750 MG 24 hr tablet TAKE 1 TABLET DAILY WITH BREAKFAST 09/02/16  Yes Eulas Post, MD  methotrexate (RHEUMATREX) 2.5 MG tablet TAKE 2 TABLETS BY MOUTH ONCE A WEEK 09/02/16  Yes Eulas Post, MD  metoprolol succinate (TOPROL-XL) 25 MG 24 hr tablet TAKE 1 TABLET (25 MG TOTAL) BY MOUTH DAILY. 07/04/16  Yes Eulas Post, MD  predniSONE (DELTASONE) 5 MG tablet Take 12.5 mg by mouth daily. 09/06/16  Yes Historical Provider, MD  TRAVATAN Z 0.004 % SOLN ophthalmic solution Place 1 drop  into both eyes at bedtime.  05/26/14  Yes Historical Provider, MD  triamcinolone cream (KENALOG) 0.1 % Apply 1 application topically as needed (for rash).  08/29/12  Yes Eulas Post, MD  HYDROcodone-acetaminophen (NORCO) 5-325 MG tablet Take 0.5-1 tablets by mouth every 4 (four) hours as needed for severe pain. 10/06/16   Sherwood Gambler, MD  nitroGLYCERIN (NITROSTAT) 0.4 MG SL tablet Place 0.4 mg under the tongue every 5 (five) minutes as needed.      Historical Provider, MD    Family History Family History  Problem Relation Age of Onset  . Heart attack Father   . Stroke Mother     Social History Social History  Substance Use Topics  . Smoking status: Never Smoker  . Smokeless tobacco: Never Used  . Alcohol use No     Allergies   Penicillins and Sulfadiazine   Review of Systems Review of Systems  Musculoskeletal: Positive for arthralgias and joint swelling.  Skin: Positive for color change (bruising).  Neurological: Positive for dizziness. Negative for weakness, numbness and headaches.  All other systems reviewed and are negative.    Physical Exam Updated Vital Signs BP 159/66 (BP Location: Right Arm)   Pulse 86   Temp 98.5 F (36.9 C) (Oral)   Resp 16   Ht 5\' 5"  (1.651 m)   Wt 119 lb (54 kg)   SpO2 99%   BMI 19.80 kg/m   Physical Exam  Constitutional: She is oriented to person, place, and time. She appears well-developed and well-nourished.  HENT:  Head: Normocephalic and atraumatic.  Right Ear: External ear normal.  Left Ear: External ear normal.  Nose: Nose normal.  Eyes: Right eye exhibits no discharge. Left eye exhibits no discharge.  Cardiovascular: Normal rate, regular rhythm and intact distal pulses.   Pulses:      Radial pulses are 2+ on the left side.  Pulmonary/Chest: Effort normal.  Abdominal: She exhibits no distension.  Musculoskeletal:       Left shoulder: She exhibits no tenderness and no deformity.       Left elbow: She exhibits no  swelling. No tenderness found.       Left wrist: She exhibits decreased range of motion, tenderness, bony tenderness and swelling.       Left upper arm: She exhibits no tenderness.       Left forearm: She exhibits no tenderness.       Arms:      Left hand: She exhibits tenderness (proximal). She exhibits no swelling.  Prolonged cap refill in bilateral fingers that appears equal. Able to move left fingers but is painful. Normal gross sensation  Neurological: She is alert and oriented to person, place, and time.  Skin: Skin is warm and dry.  Nursing note and vitals reviewed.    ED Treatments / Results  Labs (all  labs ordered are listed, but only abnormal results are displayed) Labs Reviewed - No data to display  EKG  EKG Interpretation None       Radiology Dg Forearm Left  Result Date: 10/06/2016 CLINICAL DATA:  Pt was outside raking leaves when she lost her balance and fell onto her left arm. Wrist has obvious swelling and deformity. pain concentrated in left wrist, but radiates to hand and elbow EXAM: LEFT FOREARM - 2 VIEW COMPARISON:  08/19/2006 FINDINGS: Comminuted distal radial fracture with impaction and override of the major distal fracture fragments, dorsal angulation of the distal radial articular surface. Carpal rows appear intact. There is probably an avulsion fracture from the ulnar styloid. Proximal radius and ulna intact. IMPRESSION: 1. Angulated impacted comminuted intra-articular distal left radial fracture. 2. Probable small avulsion fracture from the ulnar styloid. Electronically Signed   By: Lucrezia Europe M.D.   On: 10/06/2016 18:43   Dg Wrist 2 Views Left  Result Date: 10/06/2016 CLINICAL DATA:  Post reduction EXAM: LEFT WRIST - 2 VIEW COMPARISON:  10/06/2016 FINDINGS: Interval casting of the wrist which obscures bone detail. Mildly displaced ulnar styloid process fracture. Comminuted distal radius fracture with approximately 1/4 shaft diameter of dorsal displacement,  mildly decreased. Decreased dorsal angulation. Fracture appears slightly impacted. IMPRESSION: 1. Interval casting of the wrist. 2. Comminuted slightly impacted distal radius fracture. Slight interval decrease in dorsal angulation and displacement of distal fracture fragment. 3. Probable ulnar styloid process fracture. Electronically Signed   By: Donavan Foil M.D.   On: 10/06/2016 20:36   Dg Wrist Complete Left  Result Date: 10/06/2016 CLINICAL DATA:  Pt was outside raking leaves when she lost her balance and fell onto her left arm. Wrist has obvious swelling and deformity. pain concentrated in left wrist, but radiates to hand and elbow EXAM: LEFT WRIST - COMPLETE 3+ VIEW COMPARISON:  None. FINDINGS: Impacted comminuted fracture of the distal radius with a transverse component involving the metaphyseal region, and fracture lines in the sagittal plane involving the distal diaphysis. There is significant impaction of the major fracture fragments and significant dorsal angulation of the distal radial articular surface. The fracture does extend to the distal radial articular surface with greater than 1 mm step-off deformity. The carpal rows appear intact. There is diffuse osteopenia. IMPRESSION: 1. Comminuted intra-articular distal radial fracture with impaction and dorsal angulation of the major distal fracture fragments. Electronically Signed   By: Lucrezia Europe M.D.   On: 10/06/2016 18:42   Dg Hand Complete Left  Result Date: 10/06/2016 CLINICAL DATA:  Pt was outside raking leaves when she lost her balance and fell onto her left arm. Wrist has obvious swelling and deformity. pain concentrated in left wrist, but radiates to hand and elbow EXAM: LEFT HAND - COMPLETE 3+ VIEW COMPARISON:  None. FINDINGS: Comminuted distal radial intra-articular fracture. Dorsal angulation of the distal radial articular surface. Impaction of the major radial fracture fragments. Carpal rows remain intact. Diffuse osteopenia. No  significant osseous degenerative change. IMPRESSION: 1. Comminuted intra-articular angulated impacted distal radial fracture. 2. Osteopenia Electronically Signed   By: Lucrezia Europe M.D.   On: 10/06/2016 18:46    Procedures Reduction of fracture Date/Time: 10/06/2016 8:15 PM Performed by: Sherwood Gambler Authorized by: Sherwood Gambler  Consent: Verbal consent obtained. Risks and benefits: risks, benefits and alternatives were discussed Consent given by: patient Preparation: Patient was prepped and draped in the usual sterile fashion. Local anesthesia used: yes Anesthesia: hematoma block  Anesthesia: Local anesthesia used:  yes Local Anesthetic: lidocaine 2% without epinephrine Anesthetic total: 10 mL  Sedation: Patient sedated: no Patient tolerance: Patient tolerated the procedure well with no immediate complications Comments: Hematoma block performed and then patient placed in finger traps. After several minutes patient's left wrist has much better alignment.    (including critical care time)  Medications Ordered in ED Medications  fentaNYL (SUBLIMAZE) injection 50 mcg (50 mcg Intravenous Given 10/06/16 1833)  lidocaine (XYLOCAINE) 2 % injection 10 mL (10 mLs Other Given by Other 10/06/16 1954)  fentaNYL (SUBLIMAZE) injection 50 mcg (50 mcg Intravenous Given 10/06/16 1954)  HYDROcodone-acetaminophen (NORCO/VICODIN) 5-325 MG per tablet 1 tablet (1 tablet Oral Given 10/06/16 2145)     Initial Impression / Assessment and Plan / ED Course  I have reviewed the triage vital signs and the nursing notes.  Pertinent labs & imaging results that were available during my care of the patient were reviewed by me and considered in my medical decision making (see chart for details).  Clinical Course as of Oct 08 35  Thu Oct 06, 2016  1801 Appears neurovascular intact. No other injuries besides her hand/wrist. Will get x-rays, IV fentanyl.  [SG]  2008 Patient's left wrist appears significantly  better after being in the finger traps. We will splint and get repeat x-ray.  [SG]  2059 D/w Dr Lenon Curt, call office tomorrow to set up appointment next week  [SG]    Clinical Course User Index [SG] Sherwood Gambler, MD    Patient remains NV intact. To f/u with Dr. Lenon Curt. Discussed strict return precautions. Daughter is present and will stay with patient tonight. Already has PCP f/u tomorrow.   Final Clinical Impressions(s) / ED Diagnoses   Final diagnoses:  Closed fracture of distal end of left radius, initial encounter    New Prescriptions Discharge Medication List as of 10/06/2016  9:39 PM    START taking these medications   Details  HYDROcodone-acetaminophen (NORCO) 5-325 MG tablet Take 0.5-1 tablets by mouth every 4 (four) hours as needed for severe pain., Starting Thu 10/06/2016, Print         Sherwood Gambler, MD 10/07/16 [phone]

## 2016-10-06 NOTE — ED Notes (Signed)
Bed: [id number] Expected date:  Expected time:  Means of arrival:  Comments: EMS 91 fall

## 2016-10-06 NOTE — ED Triage Notes (Signed)
Per EMS, pt comes from home. She was outside raking leaves when she lost her balance and fell onto her left arm. Wrist has obvious swelling and deformity.  Pt has 18 g IV in right forearm but did not want pain meds.

## 2016-10-07 ENCOUNTER — Ambulatory Visit (INDEPENDENT_AMBULATORY_CARE_PROVIDER_SITE_OTHER): Payer: Medicare Other | Admitting: Family Medicine

## 2016-10-07 VITALS — BP 138/60 | HR 75 | Ht 65.0 in | Wt 116.0 lb

## 2016-10-07 DIAGNOSIS — I10 Essential (primary) hypertension: Secondary | ICD-10-CM

## 2016-10-07 DIAGNOSIS — E1165 Type 2 diabetes mellitus with hyperglycemia: Secondary | ICD-10-CM | POA: Diagnosis not present

## 2016-10-07 DIAGNOSIS — E785 Hyperlipidemia, unspecified: Secondary | ICD-10-CM | POA: Diagnosis not present

## 2016-10-07 DIAGNOSIS — K5900 Constipation, unspecified: Secondary | ICD-10-CM

## 2016-10-07 LAB — BASIC METABOLIC PANEL
BUN: 17 mg/dL (ref 6–23)
CALCIUM: 9.3 mg/dL (ref 8.4–10.5)
CO2: 28 meq/L (ref 19–32)
Chloride: 98 mEq/L (ref 96–112)
Creatinine, Ser: 0.89 mg/dL (ref 0.40–1.20)
GFR: 63.09 mL/min (ref >60.00)
GLUCOSE: 202 mg/dL — AB (ref 70–99)
Potassium: 4.7 mEq/L (ref 3.5–5.1)
SODIUM: 134 meq/L — AB (ref 135–145)

## 2016-10-07 LAB — LIPID PANEL
CHOL/HDL RATIO: 2
Cholesterol: 168 mg/dL (ref 0–200)
HDL: 72.9 mg/dL (ref >39.00)
LDL Cholesterol: 83 mg/dL (ref 0–99)
NONHDL: 94.72
Triglycerides: 60 mg/dL (ref 0.0–149.0)
VLDL: 12 mg/dL (ref 0.0–40.0)

## 2016-10-07 LAB — HEPATIC FUNCTION PANEL
ALK PHOS: 55 U/L (ref 39–117)
ALT: 36 U/L — ABNORMAL HIGH (ref 0–35)
AST: 31 U/L (ref 0–37)
Albumin: 3.6 g/dL (ref 3.5–5.2)
BILIRUBIN TOTAL: 1.1 mg/dL (ref 0.2–1.2)
Bilirubin, Direct: 0.2 mg/dL (ref 0.0–0.3)
Total Protein: 5.8 g/dL — ABNORMAL LOW (ref 6.0–8.3)

## 2016-10-07 LAB — HEMOGLOBIN A1C: Hgb A1c MFr Bld: 8.3 % — ABNORMAL HIGH (ref 4.6–6.5)

## 2016-10-07 NOTE — Progress Notes (Signed)
Pre visit review using our clinic review tool, if applicable. No additional management support is needed unless otherwise documented below in the visit note. 

## 2016-10-07 NOTE — Progress Notes (Signed)
Subjective:     Patient ID: [name], female   DOB: 03-08-1923, 83 y.o.   MRN: [id number]  HPI Patient has multiple chronic problems including chronic hearing loss, temporal arteritis history, hypertension, CAD, type 2 diabetes, osteoporosis, dyslipidemia. She had fall apparently yesterday outside while trying to rake some leaves and sustained comminuted distal radial fracture. They're trying to get follow-up with orthopedist next week. She's had some pain and daughter is giving hydrocodone sparingly. She had some recent constipation issues and has had some improvement with MiraLAX.  History is very difficult as she is very hard of hearing.  She has type 2 diabetes and they're not monitoring her blood sugars regularly. Last A1c 8.4%. She has hypertension and is on losartan and atorvastatin. We've asked for oversight of her medications that she's had some recent cognitive impairment.  Family in process of getting 24/7 supervision at home.  Past Medical History:  Diagnosis Date  . BPPV (benign paroxysmal positional vertigo)   . CAD (coronary artery disease)    a. Inferior STEMI s/p BMSx2 to RCA. Coronary anomaly with single coronary ostium arising from right sinus of Valsalva and likely LM course between PA and aorta.  . Carpal tunnel syndrome 08/04/2009  . Diabetes mellitus ([place])   . Essential hypertension   . [place], [place] 03/26/2010  . INCONTINENCE, STRESS, FEMALE 10/12/2006  . Inferior MI ([place]) 04/21/2011  . 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 H/o Left eye hemianopsia (longstanding).  . Kidney stone   . Multinodular goiter   . Osteoporosis, unspecified 10/12/2006  . RHINITIS, ALLERGIC 10/12/2006  . Single kidney    a. Absence of right kidney by prior imaging.  . TEMPORAL ARTERITIS 10/12/2006  . UNSTEADY GAIT 01/22/2007   Past Surgical History:  Procedure Laterality Date  . ABDOMINAL HYSTERECTOMY    . CATARACT EXTRACTION, BILATERAL    . CORONARY ANGIOPLASTY      reports  that she has never smoked. She has never used smokeless tobacco. She reports that she does not drink alcohol or use drugs. family history includes Heart attack in her father; Stroke in her mother.    Review of Systems  Constitutional: Negative for chills and fever.  Respiratory: Negative for shortness of breath.   Cardiovascular: Negative for chest pain.  ROS very challenging with her decreased hearing.     Objective:   Physical Exam  Constitutional:  Patient appears somewhat sedated but is cooperative  Cardiovascular: Normal rate and regular rhythm.   Pulmonary/Chest: Effort normal and breath sounds normal. No respiratory distress. She has no wheezes. She has no rales.  Musculoskeletal:  Trace edema ankles bilaterally  Left upper extremities in a sling and she has splint left upper extremity       Assessment:     # 1 recent comminuted distal left radial fracture  #2 type 2 diabetes  #3 hypertension. Initial elevated blood pressure 160/70 repeat after rest 138/60  #4 recent constipation currently stable  #5 history of dyslipidemia    Plan:     -We are not aiming for tight control of her diabetes given her multiple chronic problems and age -Recheck labs today with A1c, lipid panel, hepatic panel, basic metabolic panel -Close follow-up with orthopedist and may need to schedule appointment for next week -Watch for constipation problems with hydrocodone. Use MiraLAX as needed -We definitely agree with 24/7 supervision.  If she does not get this at home would need nursing home level of care  very likely.  Eulas Post MD [facility] at [facility]

## 2016-10-11 ENCOUNTER — Telehealth: Payer: Self-pay

## 2016-10-11 NOTE — Telephone Encounter (Signed)
Spoke with pt's caregiver, Lattie Haw. She states that pt has continuing constipation and that taking the hydrocodone may have increased this. She does not have regular bowel movements and is also c/o nausea but no vomiting. They would like to know what they could give her to help with the constipation.  Dr. Elease Hashimoto - Please advise. Thanks!

## 2016-10-11 NOTE — Telephone Encounter (Signed)
Recommend they try OTC Miralax- they can take this daily if needed.

## 2016-10-11 NOTE — Telephone Encounter (Signed)
Spoke with [name] and advised. She will try OTC Miralax. Advised her to contact office it pt has still not had BM after 2 doses in 24 hours. She voiced understanding. Nothing further needed at this time.

## 2016-10-12 NOTE — Telephone Encounter (Signed)
Pt's caregiver called to report that pt did have a very small bowel movement. It was the size of a golf ball and very hard. They are concerned that pt may have an impaction as the Miralax does not seem to be helping.  Dr. Elease Hashimoto - Please advise. Thanks!

## 2016-10-12 NOTE — Telephone Encounter (Signed)
Spoke with pt's caregiver, Lattie Haw, and advised. She agrees to try suppository but is not inclined to try enema. Advised that if suppository produces a bm to monitor pt and make sure she is having regular movements. She can administer suppositories per directions on package. Asked her to contact office tomorrow with update. She agreed.

## 2016-10-12 NOTE — Telephone Encounter (Signed)
Try glycerin suppository and if no relief with that they could try enema.  If no relief with that should go to ER.

## 2016-10-13 NOTE — Telephone Encounter (Signed)
Spoke with Lattie Haw and she reports that pt has had 2 normal bowel movements and is doing well. She mentioned that pt has not had wrist fx casted yet and is not due to see ortho until Monday, 10/17/16. She states that pt is in a lot of pain from wrist fx. Guy Franco that I would see what I could do about getting sooner ortho appt.  Spoke with Judeen Hammans @ [facility] and she assisted with scheduling pt to be seen by Dr. Percell Miller tomorrow, 10/14/16. Spoke with Lattie Haw and advised. She was very grateful for the assistance. Nothing further needed at this time.  Dr. Elease Hashimoto - FYI. Thanks!

## 2016-10-14 DIAGNOSIS — M25532 Pain in left wrist: Secondary | ICD-10-CM | POA: Diagnosis not present

## 2016-10-14 NOTE — Telephone Encounter (Signed)
Spoke with Lattie Haw and she states that pt did have hard cast placed on arm this morning. She reports that Dr. Percell Miller seemed shocked that she broke her arm 2/22 and had not had a hard cast placed yet. She was also given more pain medication. She is doing well and reports no further issues with constipation at this point. Nothing further needed.

## 2016-10-19 DIAGNOSIS — S52502A Unspecified fracture of the lower end of left radius, initial encounter for closed fracture: Secondary | ICD-10-CM | POA: Diagnosis not present

## 2016-10-21 ENCOUNTER — Telehealth: Payer: Self-pay

## 2016-10-21 NOTE — Telephone Encounter (Signed)
Form completed and signed by Dr. Elease Hashimoto. Faxed to [employer] and mailed copy to [place] as requested.   Nothing further needed.

## 2016-10-21 NOTE — Telephone Encounter (Signed)
Lattie Haw, pt's caregiver, is calling asking for new [place] form to be completed. They are looking for long-term care placement facilities and the facilities are asking to have an updated form. She needs to have this done ASAP if possible. She reports that pt is doing well, no more issues with constipation and broken arm is much less painful since cast applied.  Fax form to: [place] [phone] And please mail copy to [facility]: [address], [place], [place] [address]  Dr. Elease Hashimoto - Form has been completed and given to Dr. Elease Hashimoto to sign.

## 2016-10-28 DIAGNOSIS — S52502D Unspecified fracture of the lower end of left radius, subsequent encounter for closed fracture with routine healing: Secondary | ICD-10-CM | POA: Diagnosis not present

## 2016-11-06 ENCOUNTER — Other Ambulatory Visit: Payer: Self-pay | Admitting: Family Medicine

## 2016-11-07 ENCOUNTER — Telehealth: Payer: Self-pay | Admitting: Family Medicine

## 2016-11-07 NOTE — Telephone Encounter (Signed)
Pt is going into a facility and will be needing a TB test has she ever had one?

## 2016-11-07 NOTE — Telephone Encounter (Signed)
Left detailed message on machine that Ms Hammes has not had a ppd test done

## 2016-11-09 DIAGNOSIS — Z111 Encounter for screening for respiratory tuberculosis: Secondary | ICD-10-CM | POA: Diagnosis not present

## 2016-11-14 ENCOUNTER — Ambulatory Visit: Payer: Medicare Other

## 2016-11-18 ENCOUNTER — Other Ambulatory Visit: Payer: Self-pay | Admitting: Family Medicine

## 2016-11-23 DIAGNOSIS — S52502D Unspecified fracture of the lower end of left radius, subsequent encounter for closed fracture with routine healing: Secondary | ICD-10-CM | POA: Diagnosis not present

## 2016-11-28 DIAGNOSIS — I2510 Atherosclerotic heart disease of native coronary artery without angina pectoris: Secondary | ICD-10-CM | POA: Diagnosis not present

## 2016-11-28 DIAGNOSIS — R296 Repeated falls: Secondary | ICD-10-CM | POA: Diagnosis not present

## 2016-11-28 DIAGNOSIS — E119 Type 2 diabetes mellitus without complications: Secondary | ICD-10-CM | POA: Diagnosis not present

## 2016-11-28 DIAGNOSIS — E785 Hyperlipidemia, unspecified: Secondary | ICD-10-CM | POA: Diagnosis not present

## 2016-11-28 DIAGNOSIS — I10 Essential (primary) hypertension: Secondary | ICD-10-CM | POA: Diagnosis not present

## 2016-11-28 DIAGNOSIS — M818 Other osteoporosis without current pathological fracture: Secondary | ICD-10-CM | POA: Diagnosis not present

## 2016-11-29 ENCOUNTER — Emergency Department (HOSPITAL_COMMUNITY): Payer: Medicare Other

## 2016-11-29 ENCOUNTER — Inpatient Hospital Stay (HOSPITAL_COMMUNITY)
Admission: EM | Admit: 2016-11-29 | Discharge: 2016-12-02 | DRG: 689 | Disposition: A | Discharge status: Home / Self Care | Payer: Medicare Other | Attending: Nephrology | Admitting: Nephrology

## 2016-11-29 ENCOUNTER — Encounter (HOSPITAL_COMMUNITY): Payer: Self-pay

## 2016-11-29 ENCOUNTER — Emergency Department (HOSPITAL_COMMUNITY)
Admission: EM | Admit: 2016-11-29 | Discharge: 2016-11-29 | Disposition: A | Discharge status: Home / Self Care | Payer: Medicare Other | Source: Home / Self Care | Attending: Emergency Medicine | Admitting: Emergency Medicine

## 2016-11-29 DIAGNOSIS — E784 Other hyperlipidemia: Secondary | ICD-10-CM | POA: Diagnosis not present

## 2016-11-29 DIAGNOSIS — I2510 Atherosclerotic heart disease of native coronary artery without angina pectoris: Secondary | ICD-10-CM | POA: Insufficient documentation

## 2016-11-29 DIAGNOSIS — N3000 Acute cystitis without hematuria: Secondary | ICD-10-CM | POA: Diagnosis not present

## 2016-11-29 DIAGNOSIS — M810 Age-related osteoporosis without current pathological fracture: Secondary | ICD-10-CM | POA: Diagnosis present

## 2016-11-29 DIAGNOSIS — Y929 Unspecified place or not applicable: Secondary | ICD-10-CM

## 2016-11-29 DIAGNOSIS — B9689 Other specified bacterial agents as the cause of diseases classified elsewhere: Secondary | ICD-10-CM | POA: Diagnosis present

## 2016-11-29 DIAGNOSIS — Z1619 Resistance to other specified beta lactam antibiotics: Secondary | ICD-10-CM | POA: Diagnosis present

## 2016-11-29 DIAGNOSIS — E119 Type 2 diabetes mellitus without complications: Secondary | ICD-10-CM | POA: Diagnosis present

## 2016-11-29 DIAGNOSIS — Z955 Presence of coronary angioplasty implant and graft: Secondary | ICD-10-CM

## 2016-11-29 DIAGNOSIS — Z880 Allergy status to penicillin: Secondary | ICD-10-CM

## 2016-11-29 DIAGNOSIS — R402441 Other coma, without documented Glasgow coma scale score, or with partial score reported, in the field [EMT or ambulance]: Secondary | ICD-10-CM | POA: Diagnosis not present

## 2016-11-29 DIAGNOSIS — Z79899 Other long term (current) drug therapy: Secondary | ICD-10-CM

## 2016-11-29 DIAGNOSIS — S199XXA Unspecified injury of neck, initial encounter: Secondary | ICD-10-CM | POA: Diagnosis not present

## 2016-11-29 DIAGNOSIS — G9341 Metabolic encephalopathy: Secondary | ICD-10-CM | POA: Diagnosis not present

## 2016-11-29 DIAGNOSIS — R51 Headache: Secondary | ICD-10-CM

## 2016-11-29 DIAGNOSIS — M25512 Pain in left shoulder: Secondary | ICD-10-CM | POA: Diagnosis not present

## 2016-11-29 DIAGNOSIS — H8110 Benign paroxysmal vertigo, unspecified ear: Secondary | ICD-10-CM | POA: Diagnosis present

## 2016-11-29 DIAGNOSIS — H9190 Unspecified hearing loss, unspecified ear: Secondary | ICD-10-CM | POA: Diagnosis present

## 2016-11-29 DIAGNOSIS — Z7984 Long term (current) use of oral hypoglycemic drugs: Secondary | ICD-10-CM | POA: Insufficient documentation

## 2016-11-29 DIAGNOSIS — W19XXXA Unspecified fall, initial encounter: Secondary | ICD-10-CM | POA: Insufficient documentation

## 2016-11-29 DIAGNOSIS — G9340 Encephalopathy, unspecified: Secondary | ICD-10-CM | POA: Diagnosis present

## 2016-11-29 DIAGNOSIS — Y999 Unspecified external cause status: Secondary | ICD-10-CM

## 2016-11-29 DIAGNOSIS — Y939 Activity, unspecified: Secondary | ICD-10-CM

## 2016-11-29 DIAGNOSIS — I10 Essential (primary) hypertension: Secondary | ICD-10-CM | POA: Diagnosis not present

## 2016-11-29 DIAGNOSIS — E876 Hypokalemia: Secondary | ICD-10-CM | POA: Diagnosis present

## 2016-11-29 DIAGNOSIS — R630 Anorexia: Secondary | ICD-10-CM | POA: Diagnosis present

## 2016-11-29 DIAGNOSIS — M542 Cervicalgia: Secondary | ICD-10-CM

## 2016-11-29 DIAGNOSIS — M316 Other giant cell arteritis: Secondary | ICD-10-CM | POA: Diagnosis present

## 2016-11-29 DIAGNOSIS — F0390 Unspecified dementia without behavioral disturbance: Secondary | ICD-10-CM | POA: Diagnosis not present

## 2016-11-29 DIAGNOSIS — Z9861 Coronary angioplasty status: Secondary | ICD-10-CM

## 2016-11-29 DIAGNOSIS — E118 Type 2 diabetes mellitus with unspecified complications: Secondary | ICD-10-CM

## 2016-11-29 DIAGNOSIS — T148XXA Other injury of unspecified body region, initial encounter: Secondary | ICD-10-CM | POA: Diagnosis not present

## 2016-11-29 DIAGNOSIS — R410 Disorientation, unspecified: Secondary | ICD-10-CM | POA: Diagnosis not present

## 2016-11-29 DIAGNOSIS — I252 Old myocardial infarction: Secondary | ICD-10-CM

## 2016-11-29 DIAGNOSIS — Z882 Allergy status to sulfonamides status: Secondary | ICD-10-CM

## 2016-11-29 DIAGNOSIS — F05 Delirium due to known physiological condition: Secondary | ICD-10-CM | POA: Diagnosis not present

## 2016-11-29 DIAGNOSIS — F329 Major depressive disorder, single episode, unspecified: Secondary | ICD-10-CM | POA: Diagnosis present

## 2016-11-29 DIAGNOSIS — N390 Urinary tract infection, site not specified: Secondary | ICD-10-CM | POA: Diagnosis present

## 2016-11-29 DIAGNOSIS — S0990XA Unspecified injury of head, initial encounter: Secondary | ICD-10-CM | POA: Diagnosis not present

## 2016-11-29 DIAGNOSIS — E785 Hyperlipidemia, unspecified: Secondary | ICD-10-CM | POA: Diagnosis present

## 2016-11-29 DIAGNOSIS — Y92129 Unspecified place in nursing home as the place of occurrence of the external cause: Secondary | ICD-10-CM

## 2016-11-29 LAB — COMPREHENSIVE METABOLIC PANEL
ALK PHOS: 58 U/L (ref 38–126)
ALT: 17 U/L (ref 14–54)
ANION GAP: 10 (ref 5–15)
AST: 30 U/L (ref 15–41)
Albumin: 3.2 g/dL — ABNORMAL LOW (ref 3.5–5.0)
BILIRUBIN TOTAL: 1 mg/dL (ref 0.3–1.2)
BUN: 10 mg/dL (ref 6–20)
CALCIUM: 9.8 mg/dL (ref 8.9–10.3)
CO2: 27 mmol/L (ref 22–32)
CREATININE: 0.73 mg/dL (ref 0.44–1.00)
Chloride: 98 mmol/L — ABNORMAL LOW (ref 101–111)
GFR calc non Af Amer: >60 mL/min (ref >60)
Glucose, Bld: 169 mg/dL — ABNORMAL HIGH (ref 65–99)
Potassium: 4 mmol/L (ref 3.5–5.1)
SODIUM: 135 mmol/L (ref 135–145)
TOTAL PROTEIN: 6 g/dL — AB (ref 6.5–8.1)

## 2016-11-29 LAB — URINALYSIS, ROUTINE W REFLEX MICROSCOPIC
BILIRUBIN URINE: NEGATIVE
Glucose, UA: 50 mg/dL — AB
Ketones, ur: 20 mg/dL — AB
NITRITE: NEGATIVE
PH: 5 (ref 5.0–8.0)
Protein, ur: 100 mg/dL — AB
SPECIFIC GRAVITY, URINE: 1.019 (ref 1.005–1.030)

## 2016-11-29 LAB — CBC
HEMATOCRIT: 34.9 % — AB (ref 36.0–46.0)
HEMOGLOBIN: 11.6 g/dL — AB (ref 12.0–15.0)
MCH: 31.4 pg (ref 26.0–34.0)
MCHC: 33.2 g/dL (ref 30.0–36.0)
MCV: 94.6 fL (ref 78.0–100.0)
Platelets: 159 10*3/uL (ref 150–400)
RBC: 3.69 MIL/uL — ABNORMAL LOW (ref 3.87–5.11)
RDW: 13.9 % (ref 11.5–15.5)
WBC: 12.3 10*3/uL — ABNORMAL HIGH (ref 4.0–10.5)

## 2016-11-29 LAB — GLUCOSE, CAPILLARY
GLUCOSE-CAPILLARY: 132 mg/dL — AB (ref 65–99)
Glucose-Capillary: 132 mg/dL — ABNORMAL HIGH (ref 65–99)

## 2016-11-29 MED ORDER — LORAZEPAM 2 MG/ML IJ SOLN
0.5000 mg | Freq: Four times a day (QID) | INTRAMUSCULAR | Status: DC | PRN
Start: 2016-11-29 — End: 2016-12-02
  Administered 2016-11-30: 0.5 mg via INTRAVENOUS
  Filled 2016-11-29: qty 1

## 2016-11-29 MED ORDER — INSULIN ASPART 100 UNIT/ML ~~LOC~~ SOLN: 0.0000 [IU] | Freq: Every day | SUBCUTANEOUS | Status: DC

## 2016-11-29 MED ORDER — HYDRALAZINE HCL 20 MG/ML IJ SOLN
5.0000 mg | INTRAMUSCULAR | Status: DC | PRN
  Administered 2016-11-30: 10 mg via INTRAVENOUS
  Administered 2016-12-02: 5 mg via INTRAVENOUS
  Filled 2016-11-29 (×2): qty 1

## 2016-11-29 MED ORDER — ONDANSETRON HCL 4 MG PO TABS: 4.0000 mg | ORAL_TABLET | Freq: Four times a day (QID) | ORAL | Status: DC | PRN

## 2016-11-29 MED ORDER — ACETAMINOPHEN 650 MG RE SUPP: 650.0000 mg | Freq: Four times a day (QID) | RECTAL | Status: DC | PRN

## 2016-11-29 MED ORDER — DEXTROSE 5 % IV SOLN
1.0000 g | Freq: Once | INTRAVENOUS | Status: AC
  Administered 2016-11-29: 1 g via INTRAVENOUS
  Filled 2016-11-29: qty 10

## 2016-11-29 MED ORDER — HALOPERIDOL LACTATE 5 MG/ML IJ SOLN: 2.0000 mg | Freq: Four times a day (QID) | INTRAMUSCULAR | Status: DC | PRN

## 2016-11-29 MED ORDER — ATORVASTATIN CALCIUM 20 MG PO TABS
20.0000 mg | ORAL_TABLET | Freq: Every day | ORAL | Status: DC
  Administered 2016-11-29 – 2016-11-30 (×2): 20 mg via ORAL
  Filled 2016-11-29 (×4): qty 1

## 2016-11-29 MED ORDER — LOSARTAN POTASSIUM 50 MG PO TABS
50.0000 mg | ORAL_TABLET | Freq: Every day | ORAL | Status: DC
  Administered 2016-11-30 – 2016-12-02 (×3): 50 mg via ORAL
  Filled 2016-11-29 (×3): qty 1

## 2016-11-29 MED ORDER — METHOTREXATE 2.5 MG PO TABS: 5.0000 mg | ORAL_TABLET | ORAL | Status: DC

## 2016-11-29 MED ORDER — HEPARIN SODIUM (PORCINE) 5000 UNIT/ML IJ SOLN
5000.0000 [IU] | Freq: Three times a day (TID) | INTRAMUSCULAR | Status: DC
  Administered 2016-11-29 – 2016-12-02 (×9): 5000 [IU] via SUBCUTANEOUS
  Filled 2016-11-29 (×8): qty 1

## 2016-11-29 MED ORDER — BUSPIRONE HCL 5 MG PO TABS
10.0000 mg | ORAL_TABLET | Freq: Two times a day (BID) | ORAL | Status: DC
  Administered 2016-11-29 – 2016-12-02 (×6): 10 mg via ORAL
  Filled 2016-11-29 (×6): qty 2

## 2016-11-29 MED ORDER — METOPROLOL SUCCINATE ER 25 MG PO TB24
25.0000 mg | ORAL_TABLET | Freq: Every day | ORAL | Status: DC
  Administered 2016-11-30 – 2016-12-02 (×3): 25 mg via ORAL
  Filled 2016-11-29 (×4): qty 1

## 2016-11-29 MED ORDER — HYDROCODONE-ACETAMINOPHEN 5-325 MG PO TABS
1.0000 | ORAL_TABLET | Freq: Every day | ORAL | Status: DC | PRN
  Administered 2016-11-30 – 2016-12-01 (×2): 1 via ORAL
  Filled 2016-11-29 (×2): qty 1

## 2016-11-29 MED ORDER — INSULIN ASPART 100 UNIT/ML ~~LOC~~ SOLN
0.0000 [IU] | Freq: Three times a day (TID) | SUBCUTANEOUS | Status: DC
  Administered 2016-11-29: 1 [IU] via SUBCUTANEOUS
  Administered 2016-11-30: 2 [IU] via SUBCUTANEOUS
  Administered 2016-11-30: 5 [IU] via SUBCUTANEOUS
  Administered 2016-12-01 – 2016-12-02 (×3): 1 [IU] via SUBCUTANEOUS

## 2016-11-29 MED ORDER — ONDANSETRON HCL 4 MG/2ML IJ SOLN: 4.0000 mg | Freq: Four times a day (QID) | INTRAMUSCULAR | Status: DC | PRN

## 2016-11-29 MED ORDER — CEFTRIAXONE SODIUM 1 G IJ SOLR
1.0000 g | Freq: Once | INTRAMUSCULAR | Status: AC
  Administered 2016-11-30: 1 g via INTRAVENOUS
  Filled 2016-11-29: qty 10

## 2016-11-29 MED ORDER — LATANOPROST 0.005 % OP SOLN
1.0000 [drp] | Freq: Every day | OPHTHALMIC | Status: DC
  Administered 2016-11-29 – 2016-12-01 (×3): 1 [drp] via OPHTHALMIC
  Filled 2016-11-29: qty 2.5

## 2016-11-29 MED ORDER — SODIUM CHLORIDE 0.9 % IV SOLN
INTRAVENOUS | Status: DC
  Administered 2016-11-29 – 2016-12-01 (×3): via INTRAVENOUS

## 2016-11-29 MED ORDER — ACETAMINOPHEN 500 MG PO TABS
1000.0000 mg | ORAL_TABLET | Freq: Four times a day (QID) | ORAL | Status: DC | PRN
  Administered 2016-11-29 – 2016-12-02 (×3): 1000 mg via ORAL
  Filled 2016-11-29 (×3): qty 2

## 2016-11-29 NOTE — ED Provider Notes (Signed)
[facility] DEPT Provider Note   CSN: [id number] Arrival date & time: 11/29/16  0342     History   Chief Complaint Chief Complaint  Patient presents with  . Neck Pain    HPI [name] is a 83 y.o. female.  HPI 83 year old Caucasian female past medical history significant for diabetes, hypertension, dementia, unsteady gait, frequent falls presents to the ED today from nursing facility for unwitnessed fall. Little 5 caveat due to patient's difficulty hearing and dementia. I spoke with the nursing staff at [facility] who states that they heard a noise and went to the patient's room and found her on the ground complaining that her head and neck hurt. They state she had no other complaints. They deny any LOC. Nursing staff states that they spoke to the patient's son who states that she has had frequent falls and is unsteady on her feet. On exam patient has no further complaints and is sleeping. Past Medical History:  Diagnosis Date  . BPPV (benign paroxysmal positional vertigo)   . CAD (coronary artery disease)    a. Inferior STEMI s/p BMSx2 to RCA. Coronary anomaly with single coronary ostium arising from right sinus of Valsalva and likely LM course between PA and aorta.  . Carpal tunnel syndrome 08/04/2009  . Diabetes mellitus ([place])   . Essential hypertension   . [place], [place] 03/26/2010  . INCONTINENCE, STRESS, FEMALE 10/12/2006  . Inferior MI ([place]) 04/21/2011  . 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 H/o Left eye hemianopsia (longstanding).  . Kidney stone   . Multinodular goiter   . Osteoporosis, unspecified 10/12/2006  . RHINITIS, ALLERGIC 10/12/2006  . Single kidney    a. Absence of right kidney by prior imaging.  . TEMPORAL ARTERITIS 10/12/2006  . UNSTEADY GAIT 01/22/2007    Patient Active Problem List   Diagnosis Date Noted  . Recurrent falls 10/05/2015  . Acute encephalopathy 09/24/2015  . Fever, myalgia, and generalized weakness 09/24/2015  .  Hyperlipidemia 06/02/2015  . PMR (polymyalgia rheumatica) ([facility]) 12/17/2014  . Vertigo 10/12/2014  . Essential hypertension   . Single kidney   . Poorly controlled type 2 diabetes mellitus ([place]) 02/26/2013  . CAD (coronary artery disease) 04/21/2011  . [place], [place] 03/26/2010  . CARPAL TUNNEL SYNDROME 08/04/2009  . UNSTEADY GAIT 01/22/2007  . TEMPORAL ARTERITIS 10/12/2006  . RHINITIS, ALLERGIC 10/12/2006  . OSTEOPOROSIS, UNSPECIFIED 10/12/2006    Past Surgical History:  Procedure Laterality Date  . ABDOMINAL HYSTERECTOMY    . CATARACT EXTRACTION, BILATERAL    . CORONARY ANGIOPLASTY      OB History    No data available       Home Medications    Prior to Admission medications   Medication Sig Start Date End Date Taking? Authorizing Provider  acetaminophen (TYLENOL) 500 MG tablet Take 500 mg by mouth every 4 (four) hours as needed for mild pain, moderate pain, fever or headache.    Yes Historical Provider, MD  alum & mag hydroxide-simeth (MAALOX/MYLANTA) 200-200-20 MG/5ML suspension Take 30 mLs by mouth every 6 (six) hours as needed for indigestion or heartburn.   Yes Historical Provider, MD  atorvastatin (LIPITOR) 20 MG tablet TAKE 1 TABLET (20 MG TOTAL) BY MOUTH DAILY. Patient taking differently: Take 1 tablet by mouth at bedtime 11/18/16  Yes Eulas Post, MD  azelastine (ASTELIN) 0.1 % nasal spray USE 2 SPRAYS IN EACH NOSTRILTWICE DAILY 10/03/16  Yes Eulas Post, MD  busPIRone (BUSPAR) 10  MG tablet Take 10 mg by mouth 2 (two) times daily.   Yes Historical Provider, MD  Cholecalciferol (VITAMIN D) 2000 UNITS CAPS Take 2,000 Units by mouth daily.   Yes Historical Provider, MD  guaifenesin (ROBITUSSIN) 100 MG/5ML syrup Take 200 mg by mouth every 6 (six) hours as needed for cough.    Yes Historical Provider, MD  HYDROcodone-acetaminophen (NORCO/VICODIN) 5-325 MG tablet Take 1 tablet by mouth daily as needed for moderate pain.   Yes Historical Provider, MD   loperamide (IMODIUM) 2 MG capsule Take 2 mg by mouth every 3 (three) hours as needed for diarrhea or loose stools.   Yes Historical Provider, MD  losartan (COZAAR) 50 MG tablet TAKE 1 TABLET BY MOUTH EVERY DAY 11/07/16  Yes Eulas Post, MD  magnesium hydroxide (MILK OF MAGNESIA) 400 MG/5ML suspension Take 30 mLs by mouth at bedtime as needed for mild constipation.   Yes Historical Provider, MD  metFORMIN (GLUCOPHAGE-XR) 750 MG 24 hr tablet TAKE 1 TABLET DAILY WITH BREAKFAST 09/02/16  Yes Eulas Post, MD  methotrexate (RHEUMATREX) 2.5 MG tablet TAKE 2 TABLETS BY MOUTH ONCE A WEEK Patient taking differently: Take 2 tablets by mouth every Monday 09/02/16  Yes Eulas Post, MD  metoprolol succinate (TOPROL-XL) 25 MG 24 hr tablet TAKE 1 TABLET (25 MG TOTAL) BY MOUTH DAILY. 07/04/16  Yes Eulas Post, MD  Neomycin-Bacitracin-Polymyxin (TRIPLE ANTIBIOTIC) [phone] OINT Apply 1 application topically 3 (three) times daily as needed (for skin tears/abrasions).   Yes Historical Provider, MD  nitroGLYCERIN (NITROSTAT) 0.4 MG SL tablet Place 0.4 mg under the tongue every 5 (five) minutes as needed for chest pain.    Yes Historical Provider, MD  TRAVATAN Z 0.004 % SOLN ophthalmic solution Place 1 drop into both eyes at bedtime.    Yes Historical Provider, MD  triamcinolone cream (KENALOG) 0.1 % Apply 1 application topically 2 (two) times daily as needed (for rash).   Yes Historical Provider, MD    Family History Family History  Problem Relation Age of Onset  . Heart attack Father   . Stroke Mother     Social History Social History  Substance Use Topics  . Smoking status: Never Smoker  . Smokeless tobacco: Never Used  . Alcohol use No     Allergies   Penicillins and Sulfadiazine   Review of Systems Review of Systems  Reason unable to perform ROS: Unable to obtain ROS due to difficulty hearing.     Physical Exam Updated Vital Signs BP (!) 156/144 (BP Location: Left  Arm) Comment: informed nurse of b/p  Pulse 93   Resp 17   SpO2 92%   Physical Exam  Constitutional: She appears well-developed and well-nourished. No distress.  hearing difficulty  HENT:  Head: Normocephalic and atraumatic.  Eyes: EOM are normal. Pupils are equal, round, and reactive to light. Right eye exhibits no discharge. Left eye exhibits no discharge. No scleral icterus.  Neck: Normal range of motion. Neck supple.  Patient in c-collar. Complains of midline tenderness. No deformities or step-offs noted.  Cardiovascular: Normal rate, regular rhythm, normal heart sounds and intact distal pulses.   Pulmonary/Chest: Effort normal and breath sounds normal. No respiratory distress.  No deformities or step-offs. No tenderness.  No crepitus.  Abdominal: Soft. Bowel sounds are normal. There is no tenderness. There is no guarding.  Musculoskeletal: Normal range of motion.  Pelvis is stable. No shortening or external rotation of the lower extremity is. No lower extremity edema.  Moving all 4 sugar is running difficulties. No midline T-spine or L-spine tenderness. No deformity step-off noted.  Neurological: She is alert. GCS eye subscore is 4. GCS verbal subscore is 5. GCS motor subscore is 6.  Unable to perform accurate neuro due to c spine and dementia and hearing difficulty.  Skin: Skin is warm and dry. Capillary refill takes less than 2 seconds. No pallor.  Nursing note and vitals reviewed.    ED Treatments / Results  Labs (all labs ordered are listed, but only abnormal results are displayed) Labs Reviewed - No data to display  EKG  EKG Interpretation None       Radiology Ct Head Wo Contrast  Result Date: 11/29/2016 CLINICAL DATA:  Initial evaluation for acute trauma, fall. EXAM: CT HEAD WITHOUT CONTRAST CT CERVICAL SPINE WITHOUT CONTRAST TECHNIQUE: Multidetector CT imaging of the head and cervical spine was performed following the standard protocol without intravenous contrast.  Multiplanar CT image reconstructions of the cervical spine were also generated. COMPARISON:  Prior CT from 10/31/2015. FINDINGS: CT HEAD FINDINGS Brain: Stable atrophy with chronic microvascular ischemic disease. No acute intracranial hemorrhage. No evidence for acute large vessel territory infarct. 2.2 cm calcified meningioma overlying the right frontal convexity without significant edema. No other mass lesion. No midline shift or mass effect. Ventricular prominence related to global parenchymal volume loss of hydrocephalus. No extra-axial fluid collection. Vascular: No hyperdense vessel. Scattered vascular calcifications noted within the carotid siphons. Skull: Scalp soft tissues within normal limits.  Calvarium intact. Sinuses/Orbits: Globes and orbital soft tissues within normal limits. Mild scattered mucosal thickening within the ethmoidal air cells and maxillary sinuses. Paranasal sinuses are otherwise clear. No mastoid effusion. CT CERVICAL SPINE FINDINGS Alignment: Straightening of the normal cervical lordosis. Trace anterolisthesis of C2 on C3, C3 on C4, C4 on C5, and C7 on T1, likely due to chronic facet degeneration. Skull base and vertebrae: Skullbase intact. Normal C1-2 articulations preserved. Dens is intact. Vertebral body heights maintained. No acute fracture. Soft tissues and spinal canal: Visualized soft tissues of the neck demonstrate no acute abnormality. No prevertebral edema. Heterogeneous multinodular thyroid noted with prominent 2.4 cm right thyroid nodule, stable from previous. Disc levels: A advanced degenerative spondylolysis at C3-4 through C6-7. Multilevel facet arthrosis, stable. Upper chest: Visualized upper chest demonstrates no acute abnormality. No apical pneumothorax. IMPRESSION: CT BRAIN: 1. No acute intracranial process. 2. Stable approximate 2 cm meningioma overlying the right frontal convexity without associated edema. 3. Stable atrophy with chronic microvascular ischemic  disease. CT CERVICAL SPINE: 1. No acute traumatic injury within the cervical spine. 2. Advanced degenerative spondylolysis at C3-4 through C6-7 with multilevel facet arthrosis. 3. Stable multinodular thyroid with dominant 2.4 cm right thyroid nodule. Again, thyroid ultrasound could be performed for further evaluation if clinically desired. Electronically Signed   By: Jeannine Boga M.D.   On: 11/29/2016 06:02   Ct Cervical Spine Wo Contrast  Result Date: 11/29/2016 CLINICAL DATA:  Initial evaluation for acute trauma, fall. EXAM: CT HEAD WITHOUT CONTRAST CT CERVICAL SPINE WITHOUT CONTRAST TECHNIQUE: Multidetector CT imaging of the head and cervical spine was performed following the standard protocol without intravenous contrast. Multiplanar CT image reconstructions of the cervical spine were also generated. COMPARISON:  Prior CT from 10/31/2015. FINDINGS: CT HEAD FINDINGS Brain: Stable atrophy with chronic microvascular ischemic disease. No acute intracranial hemorrhage. No evidence for acute large vessel territory infarct. 2.2 cm calcified meningioma overlying the right frontal convexity without significant edema. No other mass lesion. No midline  shift or mass effect. Ventricular prominence related to global parenchymal volume loss of hydrocephalus. No extra-axial fluid collection. Vascular: No hyperdense vessel. Scattered vascular calcifications noted within the carotid siphons. Skull: Scalp soft tissues within normal limits.  Calvarium intact. Sinuses/Orbits: Globes and orbital soft tissues within normal limits. Mild scattered mucosal thickening within the ethmoidal air cells and maxillary sinuses. Paranasal sinuses are otherwise clear. No mastoid effusion. CT CERVICAL SPINE FINDINGS Alignment: Straightening of the normal cervical lordosis. Trace anterolisthesis of C2 on C3, C3 on C4, C4 on C5, and C7 on T1, likely due to chronic facet degeneration. Skull base and vertebrae: Skullbase intact. Normal C1-2  articulations preserved. Dens is intact. Vertebral body heights maintained. No acute fracture. Soft tissues and spinal canal: Visualized soft tissues of the neck demonstrate no acute abnormality. No prevertebral edema. Heterogeneous multinodular thyroid noted with prominent 2.4 cm right thyroid nodule, stable from previous. Disc levels: A advanced degenerative spondylolysis at C3-4 through C6-7. Multilevel facet arthrosis, stable. Upper chest: Visualized upper chest demonstrates no acute abnormality. No apical pneumothorax. IMPRESSION: CT BRAIN: 1. No acute intracranial process. 2. Stable approximate 2 cm meningioma overlying the right frontal convexity without associated edema. 3. Stable atrophy with chronic microvascular ischemic disease. CT CERVICAL SPINE: 1. No acute traumatic injury within the cervical spine. 2. Advanced degenerative spondylolysis at C3-4 through C6-7 with multilevel facet arthrosis. 3. Stable multinodular thyroid with dominant 2.4 cm right thyroid nodule. Again, thyroid ultrasound could be performed for further evaluation if clinically desired. Electronically Signed   By: Jeannine Boga M.D.   On: 11/29/2016 06:02    Procedures Procedures (including critical care time)  Medications Ordered in ED Medications - No data to display   Initial Impression / Assessment and Plan / ED Course  I have reviewed the triage vital signs and the nursing notes.  Pertinent labs & imaging results that were available during my care of the patient were reviewed by me and considered in my medical decision making (see chart for details).     The patient presents to the ED from nursing facility for unwitnessed fall. Patient with history of frequent falls and unsteady gait. Nursing staff denies LOC. Patient with difficulty hearing. She has no complaints at this time. No blood thinners. Patient does have C-spine tenderness. In C-spine precautions. No other injury noted on exam. Pelvis is stable.  No shortening or rotation of the lower extremities. Abdomen soft and nontender. Head is atraumatic. CT scan of head and cervical spine with no acute abnormalities. Meningioma noted along with thyroid nodule. Was discussed on discharge instructions in nursing facility to follow up with primary care doctor. Patient is afebrile. No tachycardia. Doubt infectious process causing fall. Appears safe to return to SNF. Patient with elevated blood pressure. Denies any complaints. History of hypertension and has not had her medications this morning. Pt seen and examined by Dr. Randal Buba who is agreeable to the above plan.     Final Clinical Impressions(s) / ED Diagnoses   Final diagnoses:  Neck pain  Fall, initial encounter    New Prescriptions New Prescriptions   No medications on file     Doristine Devoid, PA-C 11/29/16 0717    Doristine Devoid, PA-C 11/29/16 0717    April Palumbo, MD 11/29/16 [phone]

## 2016-11-29 NOTE — Discharge Instructions (Signed)
CT scan of head showed a meningioma that she can follow up with her primary care doctor. CT of her neck revealed a thyroid nodule that she will need to follow-up with her primary care doctor for further evaluation. Please ask your family doctor to schedule an outpatient ultrasound. This was printed on your discharge paperwork. Motrin or tylenol for pain. No fracture or head pain.

## 2016-11-29 NOTE — ED Provider Notes (Signed)
[place] DEPT Provider Note   CSN: [id number] Arrival date & time: 11/29/16  1157     History   Chief Complaint Chief Complaint  Patient presents with  . Altered Mental Status   Level V caveat: Acute confusion  HPI [name] is a 83 y.o. female.  HPI Patient is brought to the emergency department for ongoing confusion and generalized weakness as noted by the nursing staff at the patient's facility.  She is recently been transferred to the [facility] after treatment for her wrist fracture.  She's been at the facility for the past 5 days.  She had a fall last night in the middle the night and was seen and evaluated at [facility] and underwent CT imaging of her head neck.  She was sent back to the nursing facility after her workup for traumatic injury was without acute abnormalities.  No labs or urine samples were sent.  On return back to the nursing home the nursing facility still noted her to be weak and confused and somewhat lethargic and therefore she was sent back to the emergency department.  Patient is unable to provide any reasonable history at this time.  She follows simple commands.  She tells me that her name is [name] I can provide me no other reasonable history.   Family is now presently reports this is definite confusion for the patient and that she is off her baseline.   Past Medical History:  Diagnosis Date  . BPPV (benign paroxysmal positional vertigo)   . CAD (coronary artery disease)    a. Inferior STEMI s/p BMSx2 to RCA. Coronary anomaly with single coronary ostium arising from right sinus of Valsalva and likely LM course between PA and aorta.  . Carpal tunnel syndrome 08/04/2009  . Diabetes mellitus ([place])   . Essential hypertension   . [place], [place] 03/26/2010  . INCONTINENCE, STRESS, FEMALE 10/12/2006  . Inferior MI ([place]) 04/21/2011  . 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 H/o Left eye hemianopsia (longstanding).  . Kidney stone     . Multinodular goiter   . Osteoporosis, unspecified 10/12/2006  . RHINITIS, ALLERGIC 10/12/2006  . Single kidney    a. Absence of right kidney by prior imaging.  . TEMPORAL ARTERITIS 10/12/2006  . UNSTEADY GAIT 01/22/2007    Patient Active Problem List   Diagnosis Date Noted  . Recurrent falls 10/05/2015  . Acute encephalopathy 09/24/2015  . Fever, myalgia, and generalized weakness 09/24/2015  . Hyperlipidemia 06/02/2015  . PMR (polymyalgia rheumatica) ([facility]) 12/17/2014  . Vertigo 10/12/2014  . Essential hypertension   . Single kidney   . Poorly controlled type 2 diabetes mellitus ([place]) 02/26/2013  . CAD (coronary artery disease) 04/21/2011  . [place], [place] 03/26/2010  . CARPAL TUNNEL SYNDROME 08/04/2009  . UNSTEADY GAIT 01/22/2007  . TEMPORAL ARTERITIS 10/12/2006  . RHINITIS, ALLERGIC 10/12/2006  . OSTEOPOROSIS, UNSPECIFIED 10/12/2006    Past Surgical History:  Procedure Laterality Date  . ABDOMINAL HYSTERECTOMY    . CATARACT EXTRACTION, BILATERAL    . CORONARY ANGIOPLASTY      OB History    No data available       Home Medications    Prior to Admission medications   Medication Sig Start Date End Date Taking? Authorizing Provider  acetaminophen (TYLENOL) 500 MG tablet Take 500 mg by mouth every 4 (four) hours as needed for mild pain, moderate pain, fever or headache.     Historical Provider, MD  alum & mag hydroxide-simeth (MAALOX/MYLANTA) 200-200-20 MG/5ML suspension Take 30 mLs by mouth every 6 (six) hours as needed for indigestion or heartburn.    Historical Provider, MD  atorvastatin (LIPITOR) 20 MG tablet TAKE 1 TABLET (20 MG TOTAL) BY MOUTH DAILY. Patient taking differently: Take 1 tablet by mouth at bedtime 11/18/16   Eulas Post, MD  azelastine (ASTELIN) 0.1 % nasal spray USE 2 SPRAYS IN EACH NOSTRILTWICE DAILY 10/03/16   Eulas Post, MD  busPIRone (BUSPAR) 10 MG tablet Take 10 mg by mouth 2 (two) times daily.    Historical Provider, MD   Cholecalciferol (VITAMIN D) 2000 UNITS CAPS Take 2,000 Units by mouth daily.    Historical Provider, MD  guaifenesin (ROBITUSSIN) 100 MG/5ML syrup Take 200 mg by mouth every 6 (six) hours as needed for cough.     Historical Provider, MD  HYDROcodone-acetaminophen (NORCO/VICODIN) 5-325 MG tablet Take 1 tablet by mouth daily as needed for moderate pain.    Historical Provider, MD  loperamide (IMODIUM) 2 MG capsule Take 2 mg by mouth every 3 (three) hours as needed for diarrhea or loose stools.    Historical Provider, MD  losartan (COZAAR) 50 MG tablet TAKE 1 TABLET BY MOUTH EVERY DAY 11/07/16   Eulas Post, MD  magnesium hydroxide (MILK OF MAGNESIA) 400 MG/5ML suspension Take 30 mLs by mouth at bedtime as needed for mild constipation.    Historical Provider, MD  metFORMIN (GLUCOPHAGE-XR) 750 MG 24 hr tablet TAKE 1 TABLET DAILY WITH BREAKFAST 09/02/16   Eulas Post, MD  methotrexate (RHEUMATREX) 2.5 MG tablet TAKE 2 TABLETS BY MOUTH ONCE A WEEK Patient taking differently: Take 2 tablets by mouth every Monday 09/02/16   Eulas Post, MD  metoprolol succinate (TOPROL-XL) 25 MG 24 hr tablet TAKE 1 TABLET (25 MG TOTAL) BY MOUTH DAILY. 07/04/16   Eulas Post, MD  Neomycin-Bacitracin-Polymyxin (TRIPLE ANTIBIOTIC) [phone] OINT Apply 1 application topically 3 (three) times daily as needed (for skin tears/abrasions).    Historical Provider, MD  nitroGLYCERIN (NITROSTAT) 0.4 MG SL tablet Place 0.4 mg under the tongue every 5 (five) minutes as needed for chest pain.     Historical Provider, MD  TRAVATAN Z 0.004 % SOLN ophthalmic solution Place 1 drop into both eyes at bedtime.     Historical Provider, MD  triamcinolone cream (KENALOG) 0.1 % Apply 1 application topically 2 (two) times daily as needed (for rash).    Historical Provider, MD    Family History Family History  Problem Relation Age of Onset  . Heart attack Father   . Stroke Mother     Social History Social History   Substance Use Topics  . Smoking status: Never Smoker  . Smokeless tobacco: Never Used  . Alcohol use No     Allergies   Penicillins and Sulfadiazine   Review of Systems Review of Systems  Unable to perform ROS: Mental status change     Physical Exam Updated Vital Signs BP 132/65   Pulse 81   Temp 98 F (36.7 C) (Oral)   Resp 19   Ht 5\' 2"  (1.575 m)   Wt 115 lb (52.2 kg)   SpO2 96%   BMI 21.03 kg/m   Physical Exam  Constitutional: She appears well-developed and well-nourished. No distress.  HENT:  Head: Normocephalic and atraumatic.  Eyes: EOM are normal.  Neck: Normal range of motion.  Cardiovascular: Normal rate, regular rhythm and normal heart sounds.   Pulmonary/Chest: Effort normal  and breath sounds normal.  Abdominal: Soft. She exhibits no distension. There is no tenderness.  Musculoskeletal: Normal range of motion.  Neurological: She is alert.  Follow simple commands.  Skin: Skin is warm and dry.  Psychiatric: She has a normal mood and affect. Judgment normal.  Nursing note and vitals reviewed.    ED Treatments / Results  Labs (all labs ordered are listed, but only abnormal results are displayed) Labs Reviewed  CBC - Abnormal; Notable for the following:       Result Value   WBC 12.3 (*)    RBC 3.69 (*)    Hemoglobin 11.6 (*)    HCT 34.9 (*)    All other components within normal limits  COMPREHENSIVE METABOLIC PANEL - Abnormal; Notable for the following:    Chloride 98 (*)    Glucose, Bld 169 (*)    Total Protein 6.0 (*)    Albumin 3.2 (*)    All other components within normal limits  URINALYSIS, ROUTINE W REFLEX MICROSCOPIC - Abnormal; Notable for the following:    APPearance CLOUDY (*)    Glucose, UA 50 (*)    Hgb urine dipstick MODERATE (*)    Ketones, ur 20 (*)    Protein, ur 100 (*)    Leukocytes, UA SMALL (*)    Bacteria, UA MANY (*)    Squamous Epithelial / LPF 0-5 (*)    All other components within normal limits  URINE CULTURE     EKG  EKG Interpretation None       Radiology Dg Chest 2 View  Result Date: 11/29/2016 CLINICAL DATA:  Confusion. EXAM: CHEST  2 VIEW COMPARISON:  10/31/2015 FINDINGS: Cardiomediastinal silhouette is stably enlarged. Tortuosity and calcific atherosclerotic disease of the aorta noted. There is a stable deviation of the tracheal air column at the thoracic inlet to the left. There is no evidence of focal airspace consolidation, pleural effusion or pneumothorax. Osseous structures are without acute abnormality. Osteoarthritic changes of bilateral glenohumeral joints. Soft tissues are grossly normal. IMPRESSION: Stable enlargement of the cardiac silhouette. Tortuosity and calcific atherosclerotic disease of the aorta. No evidence of active pulmonary process. Electronically Signed   By: Fidela Salisbury M.D.   On: 11/29/2016 12:51   Ct Head Wo Contrast  Result Date: 11/29/2016 CLINICAL DATA:  Initial evaluation for acute trauma, fall. EXAM: CT HEAD WITHOUT CONTRAST CT CERVICAL SPINE WITHOUT CONTRAST TECHNIQUE: Multidetector CT imaging of the head and cervical spine was performed following the standard protocol without intravenous contrast. Multiplanar CT image reconstructions of the cervical spine were also generated. COMPARISON:  Prior CT from 10/31/2015. FINDINGS: CT HEAD FINDINGS Brain: Stable atrophy with chronic microvascular ischemic disease. No acute intracranial hemorrhage. No evidence for acute large vessel territory infarct. 2.2 cm calcified meningioma overlying the right frontal convexity without significant edema. No other mass lesion. No midline shift or mass effect. Ventricular prominence related to global parenchymal volume loss of hydrocephalus. No extra-axial fluid collection. Vascular: No hyperdense vessel. Scattered vascular calcifications noted within the carotid siphons. Skull: Scalp soft tissues within normal limits.  Calvarium intact. Sinuses/Orbits: Globes and orbital soft  tissues within normal limits. Mild scattered mucosal thickening within the ethmoidal air cells and maxillary sinuses. Paranasal sinuses are otherwise clear. No mastoid effusion. CT CERVICAL SPINE FINDINGS Alignment: Straightening of the normal cervical lordosis. Trace anterolisthesis of C2 on C3, C3 on C4, C4 on C5, and C7 on T1, likely due to chronic facet degeneration. Skull base and vertebrae: Skullbase intact. Normal  C1-2 articulations preserved. Dens is intact. Vertebral body heights maintained. No acute fracture. Soft tissues and spinal canal: Visualized soft tissues of the neck demonstrate no acute abnormality. No prevertebral edema. Heterogeneous multinodular thyroid noted with prominent 2.4 cm right thyroid nodule, stable from previous. Disc levels: A advanced degenerative spondylolysis at C3-4 through C6-7. Multilevel facet arthrosis, stable. Upper chest: Visualized upper chest demonstrates no acute abnormality. No apical pneumothorax. IMPRESSION: CT BRAIN: 1. No acute intracranial process. 2. Stable approximate 2 cm meningioma overlying the right frontal convexity without associated edema. 3. Stable atrophy with chronic microvascular ischemic disease. CT CERVICAL SPINE: 1. No acute traumatic injury within the cervical spine. 2. Advanced degenerative spondylolysis at C3-4 through C6-7 with multilevel facet arthrosis. 3. Stable multinodular thyroid with dominant 2.4 cm right thyroid nodule. Again, thyroid ultrasound could be performed for further evaluation if clinically desired. Electronically Signed   By: Jeannine Boga M.D.   On: 11/29/2016 06:02   Ct Cervical Spine Wo Contrast  Result Date: 11/29/2016 CLINICAL DATA:  Initial evaluation for acute trauma, fall. EXAM: CT HEAD WITHOUT CONTRAST CT CERVICAL SPINE WITHOUT CONTRAST TECHNIQUE: Multidetector CT imaging of the head and cervical spine was performed following the standard protocol without intravenous contrast. Multiplanar CT image  reconstructions of the cervical spine were also generated. COMPARISON:  Prior CT from 10/31/2015. FINDINGS: CT HEAD FINDINGS Brain: Stable atrophy with chronic microvascular ischemic disease. No acute intracranial hemorrhage. No evidence for acute large vessel territory infarct. 2.2 cm calcified meningioma overlying the right frontal convexity without significant edema. No other mass lesion. No midline shift or mass effect. Ventricular prominence related to global parenchymal volume loss of hydrocephalus. No extra-axial fluid collection. Vascular: No hyperdense vessel. Scattered vascular calcifications noted within the carotid siphons. Skull: Scalp soft tissues within normal limits.  Calvarium intact. Sinuses/Orbits: Globes and orbital soft tissues within normal limits. Mild scattered mucosal thickening within the ethmoidal air cells and maxillary sinuses. Paranasal sinuses are otherwise clear. No mastoid effusion. CT CERVICAL SPINE FINDINGS Alignment: Straightening of the normal cervical lordosis. Trace anterolisthesis of C2 on C3, C3 on C4, C4 on C5, and C7 on T1, likely due to chronic facet degeneration. Skull base and vertebrae: Skullbase intact. Normal C1-2 articulations preserved. Dens is intact. Vertebral body heights maintained. No acute fracture. Soft tissues and spinal canal: Visualized soft tissues of the neck demonstrate no acute abnormality. No prevertebral edema. Heterogeneous multinodular thyroid noted with prominent 2.4 cm right thyroid nodule, stable from previous. Disc levels: A advanced degenerative spondylolysis at C3-4 through C6-7. Multilevel facet arthrosis, stable. Upper chest: Visualized upper chest demonstrates no acute abnormality. No apical pneumothorax. IMPRESSION: CT BRAIN: 1. No acute intracranial process. 2. Stable approximate 2 cm meningioma overlying the right frontal convexity without associated edema. 3. Stable atrophy with chronic microvascular ischemic disease. CT CERVICAL  SPINE: 1. No acute traumatic injury within the cervical spine. 2. Advanced degenerative spondylolysis at C3-4 through C6-7 with multilevel facet arthrosis. 3. Stable multinodular thyroid with dominant 2.4 cm right thyroid nodule. Again, thyroid ultrasound could be performed for further evaluation if clinically desired. Electronically Signed   By: Jeannine Boga M.D.   On: 11/29/2016 06:02    Procedures Procedures (including critical care time)  Medications Ordered in ED Medications  cefTRIAXone (ROCEPHIN) 1 g in dextrose 5 % 50 mL IVPB (1 g Intravenous New Bag/Given 11/29/16 1354)     Initial Impression / Assessment and Plan / ED Course  I have reviewed the triage vital signs and the  nursing notes.  Pertinent labs & imaging results that were available during my care of the patient were reviewed by me and considered in my medical decision making (see chart for details).     Acute confusion.  Likely urinary tract infection.  IV Rocephin given.  Urine culture obtained.  Patient be admitted the hospital for recurrent ER visit with ongoing confusion.  Final Clinical Impressions(s) / ED Diagnoses   Final diagnoses:  Acute confusion due to infection  Acute cystitis without hematuria    New Prescriptions New Prescriptions   No medications on file     Jola Schmidt, MD 11/29/16 1408

## 2016-11-29 NOTE — ED Notes (Signed)
Patient is hard of hearing and has limited response.  Patient is being seen for unwitnessed fall.  EMS reports patient states she has 10 out of 10 pain when she is not sleeping.  Patient in C-collar on arrival to the ED

## 2016-11-29 NOTE — ED Triage Notes (Signed)
Pt had an unwitnessed fall and the staff say that she complains of neck pain

## 2016-11-29 NOTE — ED Notes (Signed)
ED Provider at bedside. 

## 2016-11-29 NOTE — ED Notes (Signed)
Pt son Fara Olden is [phone]  Call lisa first- if unable to contact call joel  Andee Poles [phone]

## 2016-11-29 NOTE — ED Notes (Signed)
Attempted to give report RN to call back.

## 2016-11-29 NOTE — Progress Notes (Signed)
Attempted to feed pt. Pt refuses at this time.

## 2016-11-29 NOTE — H&P (Addendum)
History and Physical    [name] QIW:[id number] DOB: [birth year] DOA: 11/29/2016  PCP: Eulas Post, MD Patient coming from: [place]  Chief Complaint: confusion  HPI: [name] is a 83 y.o. female with medical history significant of HLD, CAD, diabetes, hypertension, MI, nephrolithiasis, temporal arteritis. Level V caveat applies this patient is unable to provide reliable history due to her confusion. EMS was called to evaluate patient and bring to the ED due to confusion reported by nursing home staff. Patient has been a resident at [facility] for the past 5 days after being transferred there after treatment for a wrist fracture. Patient fell the night prior to admission and was evaluated at [facility] with a CT scan of her head and neck which were unremarkable. Patient's weakness and confusion started around the time of return to the nursing home. Became progressively more lethargic. No other focal antibodies were noted by nursing home staff such as fevers, abdominal pain, complaints of dysuria or frequency, rash, LOC, dizziness, chest pain, shortness breath or palpitations.   ED Course: objective findings outlined below  Review of Systems: As per HPI otherwise 10 point review of systems negative.   Ambulatory Status: minimally ambulatory  Past Medical History:  Diagnosis Date  . BPPV (benign paroxysmal positional vertigo)   . CAD (coronary artery disease)    a. Inferior STEMI s/p BMSx2 to RCA. Coronary anomaly with single coronary ostium arising from right sinus of Valsalva and likely LM course between PA and aorta.  . Carpal tunnel syndrome 08/04/2009  . Diabetes mellitus ([place])   . Essential hypertension   . [place], [place] 03/26/2010  . INCONTINENCE, STRESS, FEMALE 10/12/2006  . Inferior MI ([place]) 04/21/2011  . 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 H/o Left eye hemianopsia (longstanding).  . Kidney stone   . Multinodular goiter   .  Osteoporosis, unspecified 10/12/2006  . RHINITIS, ALLERGIC 10/12/2006  . Single kidney    a. Absence of right kidney by prior imaging.  . TEMPORAL ARTERITIS 10/12/2006  . UNSTEADY GAIT 01/22/2007    Past Surgical History:  Procedure Laterality Date  . ABDOMINAL HYSTERECTOMY    . CATARACT EXTRACTION, BILATERAL    . CORONARY ANGIOPLASTY      Social History   Social History  . Marital status: Widowed    Spouse name: N/A  . Number of children: N/A  . Years of education: N/A   Occupational History  . Not on file.   Social History Main Topics  . Smoking status: Never Smoker  . Smokeless tobacco: Never Used  . Alcohol use No  . Drug use: No  . Sexual activity: No   Other Topics Concern  . Not on file   Social History Narrative  . No narrative on file    Allergies  Allergen Reactions  . Penicillins Hives and Other (See Comments)    Has patient had a PCN reaction causing immediate rash, facial/tongue/throat swelling, SOB or lightheadedness with hypotension: Yes Has patient had a PCN reaction causing severe rash involving mucus membranes or skin necrosis: Unk Has patient had a PCN reaction that required hospitalization: Unk Has patient had a PCN reaction occurring within the last 10 years: Unk If all of the above answers are "NO", then may proceed with Cephalosporin use.  . Sulfadiazine Hives    Family History  Problem Relation Age of Onset  . Heart attack Father   . Stroke Mother  Prior to Admission medications   Medication Sig Start Date End Date Taking? Authorizing Provider  acetaminophen (TYLENOL) 500 MG tablet Take 500 mg by mouth every 4 (four) hours as needed (for headache, doscomfort, or fever of 99.5-101 F).    Yes Historical Provider, MD  alum & mag hydroxide-simeth (MINTOX) 200-200-20 MG/5ML suspension Take 30 mLs by mouth every 6 (six) hours as needed for indigestion or heartburn. NOT TO EXCEED 4 DOSES/24 HOURS   Yes Historical Provider, MD  atorvastatin  (LIPITOR) 20 MG tablet TAKE 1 TABLET (20 MG TOTAL) BY MOUTH DAILY. Patient taking differently: Take 20 mg by mouth at bedtime 11/18/16  Yes Eulas Post, MD  azelastine (ASTELIN) 0.1 % nasal spray USE 2 SPRAYS IN EACH NOSTRILTWICE DAILY 10/03/16  Yes Eulas Post, MD  busPIRone (BUSPAR) 10 MG tablet Take 10 mg by mouth 2 (two) times daily.   Yes Historical Provider, MD  Cholecalciferol (VITAMIN D) 2000 UNITS CAPS Take 2,000 Units by mouth daily.   Yes Historical Provider, MD  guaifenesin (ROBITUSSIN) 100 MG/5ML syrup Take 200 mg by mouth every 6 (six) hours as needed for cough.    Yes Historical Provider, MD  HYDROcodone-acetaminophen (NORCO/VICODIN) 5-325 MG tablet Take 1 tablet by mouth daily as needed (for pain).    Yes Historical Provider, MD  loperamide (IMODIUM) 2 MG capsule Take 2 mg by mouth See admin instructions. With each loose stool as needed for diarrhea (max 8 doses/24 hours)   Yes Historical Provider, MD  losartan (COZAAR) 50 MG tablet TAKE 1 TABLET BY MOUTH EVERY DAY 11/07/16  Yes Eulas Post, MD  magnesium hydroxide (MILK OF MAGNESIA) 400 MG/5ML suspension Take 30 mLs by mouth at bedtime as needed for mild constipation.   Yes Historical Provider, MD  metFORMIN (GLUCOPHAGE-XR) 750 MG 24 hr tablet TAKE 1 TABLET DAILY WITH BREAKFAST 09/02/16  Yes Eulas Post, MD  methotrexate (RHEUMATREX) 2.5 MG tablet TAKE 2 TABLETS BY MOUTH ONCE A WEEK Patient taking differently: Take 5 mg by mouth every Monday 09/02/16  Yes Eulas Post, MD  metoprolol succinate (TOPROL-XL) 25 MG 24 hr tablet TAKE 1 TABLET (25 MG TOTAL) BY MOUTH DAILY. 07/04/16  Yes Eulas Post, MD  Neomycin-Bacitracin-Polymyxin (TRIPLE ANTIBIOTIC) [phone] OINT Apply 1 application topically See admin instructions. To affected area(s) for skin tears or abrasions (Clean area with normal saline, apply ointment, cover with Bandaid or gauze and tape & change as needed until healed.)   Yes Historical Provider,  MD  nitroGLYCERIN (NITROSTAT) 0.4 MG SL tablet Place 0.4 mg under the tongue every 5 (five) minutes as needed for chest pain.    Yes Historical Provider, MD  TRAVATAN Z 0.004 % SOLN ophthalmic solution Place 1 drop into both eyes at bedtime.    Yes Historical Provider, MD  triamcinolone cream (KENALOG) 0.1 % Apply 1 application topically 2 (two) times daily as needed (for rash).   Yes Historical Provider, MD    Physical Exam: Vitals:   11/29/16 1345 11/29/16 1415 11/29/16 1445 11/29/16 1515  BP: 132/65 (!) 140/59 (!) 176/71 (!) 150/60  Pulse: 81 84 88 86  Resp: 19 18 18 19   Temp:      TempSrc:      SpO2: 96% 97% 97% 96%  Weight:      Height:         General:  Appears calm and comfortable Eyes:  PERRL, EOMI, normal lids, iris ENT:  grossly normal hearing, lips & tongue, mmm Neck:  no LAD, masses or thyromegaly Cardiovascular:  RRR, . 1+ LE edema.  Respiratory:  CTA bilaterally, . Normal respiratory effort. Abdomen:  soft, ntnd, NABS Skin:  no rash or induration seen on limited exam Musculoskeletal:  grossly normal tone BUE/BLE, good ROM, no bony abnormality Psychiatric: Apparent slow mentation as follows commands very slowly. Nonverbal at time of my examination. Very sleepy. Neurologic:   very difficult to assess. Moves all extremities. No facial asymmetry. Sensation intact.   Labs on Admission: I have personally reviewed following labs and imaging studies  CBC:  Recent Labs Lab 11/29/16 1211  WBC 12.3*  HGB 11.6*  HCT 34.9*  MCV 94.6  PLT 263   Basic Metabolic Panel:  Recent Labs Lab 11/29/16 1211  NA 135  K 4.0  CL 98*  CO2 27  GLUCOSE 169*  BUN 10  CREATININE 0.73  CALCIUM 9.8   GFR: Estimated Creatinine Clearance: 36.2 mL/min (by C-G formula based on SCr of 0.73 mg/dL). Liver Function Tests:  Recent Labs Lab 11/29/16 1211  AST 30  ALT 17  ALKPHOS 58  BILITOT 1.0  PROT 6.0*  ALBUMIN 3.2*   No results for input(s): LIPASE, AMYLASE in the last  168 hours. No results for input(s): AMMONIA in the last 168 hours. Coagulation Profile: No results for input(s): INR, PROTIME in the last 168 hours. Cardiac Enzymes: No results for input(s): CKTOTAL, CKMB, CKMBINDEX, TROPONINI in the last 168 hours. BNP (last 3 results) No results for input(s): PROBNP in the last 8760 hours. HbA1C: No results for input(s): HGBA1C in the last 72 hours. CBG: No results for input(s): GLUCAP in the last 168 hours. Lipid Profile: No results for input(s): CHOL, HDL, LDLCALC, TRIG, CHOLHDL, LDLDIRECT in the last 72 hours. Thyroid Function Tests: No results for input(s): TSH, T4TOTAL, FREET4, T3FREE, THYROIDAB in the last 72 hours. Anemia Panel: No results for input(s): VITAMINB12, FOLATE, FERRITIN, TIBC, IRON, RETICCTPCT in the last 72 hours. Urine analysis:    Component Value Date/Time   COLORURINE YELLOW 11/29/2016 1226   APPEARANCEUR CLOUDY (A) 11/29/2016 1226   LABSPEC 1.019 11/29/2016 1226   PHURINE 5.0 11/29/2016 1226   GLUCOSEU 50 (A) 11/29/2016 1226   HGBUR MODERATE (A) 11/29/2016 1226   BILIRUBINUR NEGATIVE 11/29/2016 1226   BILIRUBINUR neg 12/03/2013 1135   KETONESUR 20 (A) 11/29/2016 1226   PROTEINUR 100 (A) 11/29/2016 1226   UROBILINOGEN 0.2 12/03/2013 1135   UROBILINOGEN 1.0 11/14/2013 1325   NITRITE NEGATIVE 11/29/2016 1226   LEUKOCYTESUR SMALL (A) 11/29/2016 1226    Creatinine Clearance: Estimated Creatinine Clearance: 36.2 mL/min (by C-G formula based on SCr of 0.73 mg/dL).  Sepsis Labs: @LABRCNTIP (procalcitonin:4,lacticidven:4) )No results found for this or any previous visit (from the past 240 hour(s)).   Radiological Exams on Admission: Dg Chest 2 View  Result Date: 11/29/2016 CLINICAL DATA:  Confusion. EXAM: CHEST  2 VIEW COMPARISON:  10/31/2015 FINDINGS: Cardiomediastinal silhouette is stably enlarged. Tortuosity and calcific atherosclerotic disease of the aorta noted. There is a stable deviation of the tracheal air column  at the thoracic inlet to the left. There is no evidence of focal airspace consolidation, pleural effusion or pneumothorax. Osseous structures are without acute abnormality. Osteoarthritic changes of bilateral glenohumeral joints. Soft tissues are grossly normal. IMPRESSION: Stable enlargement of the cardiac silhouette. Tortuosity and calcific atherosclerotic disease of the aorta. No evidence of active pulmonary process. Electronically Signed   By: Fidela Salisbury M.D.   On: 11/29/2016 12:51   Ct Head Wo Contrast  Result  Date: 11/29/2016 CLINICAL DATA:  Initial evaluation for acute trauma, fall. EXAM: CT HEAD WITHOUT CONTRAST CT CERVICAL SPINE WITHOUT CONTRAST TECHNIQUE: Multidetector CT imaging of the head and cervical spine was performed following the standard protocol without intravenous contrast. Multiplanar CT image reconstructions of the cervical spine were also generated. COMPARISON:  Prior CT from 10/31/2015. FINDINGS: CT HEAD FINDINGS Brain: Stable atrophy with chronic microvascular ischemic disease. No acute intracranial hemorrhage. No evidence for acute large vessel territory infarct. 2.2 cm calcified meningioma overlying the right frontal convexity without significant edema. No other mass lesion. No midline shift or mass effect. Ventricular prominence related to global parenchymal volume loss of hydrocephalus. No extra-axial fluid collection. Vascular: No hyperdense vessel. Scattered vascular calcifications noted within the carotid siphons. Skull: Scalp soft tissues within normal limits.  Calvarium intact. Sinuses/Orbits: Globes and orbital soft tissues within normal limits. Mild scattered mucosal thickening within the ethmoidal air cells and maxillary sinuses. Paranasal sinuses are otherwise clear. No mastoid effusion. CT CERVICAL SPINE FINDINGS Alignment: Straightening of the normal cervical lordosis. Trace anterolisthesis of C2 on C3, C3 on C4, C4 on C5, and C7 on T1, likely due to chronic  facet degeneration. Skull base and vertebrae: Skullbase intact. Normal C1-2 articulations preserved. Dens is intact. Vertebral body heights maintained. No acute fracture. Soft tissues and spinal canal: Visualized soft tissues of the neck demonstrate no acute abnormality. No prevertebral edema. Heterogeneous multinodular thyroid noted with prominent 2.4 cm right thyroid nodule, stable from previous. Disc levels: A advanced degenerative spondylolysis at C3-4 through C6-7. Multilevel facet arthrosis, stable. Upper chest: Visualized upper chest demonstrates no acute abnormality. No apical pneumothorax. IMPRESSION: CT BRAIN: 1. No acute intracranial process. 2. Stable approximate 2 cm meningioma overlying the right frontal convexity without associated edema. 3. Stable atrophy with chronic microvascular ischemic disease. CT CERVICAL SPINE: 1. No acute traumatic injury within the cervical spine. 2. Advanced degenerative spondylolysis at C3-4 through C6-7 with multilevel facet arthrosis. 3. Stable multinodular thyroid with dominant 2.4 cm right thyroid nodule. Again, thyroid ultrasound could be performed for further evaluation if clinically desired. Electronically Signed   By: Jeannine Boga M.D.   On: 11/29/2016 06:02   Ct Cervical Spine Wo Contrast  Result Date: 11/29/2016 CLINICAL DATA:  Initial evaluation for acute trauma, fall. EXAM: CT HEAD WITHOUT CONTRAST CT CERVICAL SPINE WITHOUT CONTRAST TECHNIQUE: Multidetector CT imaging of the head and cervical spine was performed following the standard protocol without intravenous contrast. Multiplanar CT image reconstructions of the cervical spine were also generated. COMPARISON:  Prior CT from 10/31/2015. FINDINGS: CT HEAD FINDINGS Brain: Stable atrophy with chronic microvascular ischemic disease. No acute intracranial hemorrhage. No evidence for acute large vessel territory infarct. 2.2 cm calcified meningioma overlying the right frontal convexity without  significant edema. No other mass lesion. No midline shift or mass effect. Ventricular prominence related to global parenchymal volume loss of hydrocephalus. No extra-axial fluid collection. Vascular: No hyperdense vessel. Scattered vascular calcifications noted within the carotid siphons. Skull: Scalp soft tissues within normal limits.  Calvarium intact. Sinuses/Orbits: Globes and orbital soft tissues within normal limits. Mild scattered mucosal thickening within the ethmoidal air cells and maxillary sinuses. Paranasal sinuses are otherwise clear. No mastoid effusion. CT CERVICAL SPINE FINDINGS Alignment: Straightening of the normal cervical lordosis. Trace anterolisthesis of C2 on C3, C3 on C4, C4 on C5, and C7 on T1, likely due to chronic facet degeneration. Skull base and vertebrae: Skullbase intact. Normal C1-2 articulations preserved. Dens is intact. Vertebral body heights maintained. No  acute fracture. Soft tissues and spinal canal: Visualized soft tissues of the neck demonstrate no acute abnormality. No prevertebral edema. Heterogeneous multinodular thyroid noted with prominent 2.4 cm right thyroid nodule, stable from previous. Disc levels: A advanced degenerative spondylolysis at C3-4 through C6-7. Multilevel facet arthrosis, stable. Upper chest: Visualized upper chest demonstrates no acute abnormality. No apical pneumothorax. IMPRESSION: CT BRAIN: 1. No acute intracranial process. 2. Stable approximate 2 cm meningioma overlying the right frontal convexity without associated edema. 3. Stable atrophy with chronic microvascular ischemic disease. CT CERVICAL SPINE: 1. No acute traumatic injury within the cervical spine. 2. Advanced degenerative spondylolysis at C3-4 through C6-7 with multilevel facet arthrosis. 3. Stable multinodular thyroid with dominant 2.4 cm right thyroid nodule. Again, thyroid ultrasound could be performed for further evaluation if clinically desired. Electronically Signed   By: Jeannine Boga M.D.   On: 11/29/2016 06:02     Assessment/Plan Active Problems:   Acute encephalopathy   Acute encephalopathy: Likely secondary to UTI with a underlying dementia. Per [place] staff at baseline pt will follow commands and attempts to be conversive but is confused. CT head neck without acute abnormality. No evidence of seizure activity, metabolic derangement, or medication induced symptomatology. - Treat UTI as below - Ativan and Haldol PRN  UTI: UA as above and grossly abnormal.  - Ceftriaxone - UCX,   HTN: - continue losartan, metoprolol  Temporal arteritis: at baseline - continue methotrexate  HLD: - continue statin  Depression: - continue buspar  DVT prophylaxis: hep  Code Status: full - presumed  Family Communication: none - discussed w/ nursing home staff at [facility]: pending improvement and anticipate return to SNF on 4/18  Consults called: none  Admission status: observation     Madeline Pho J MD [facility]  If 7PM-7AM, please contact night-coverage [email or web address] Password [facility]  11/29/2016, 3:45 PM

## 2016-11-29 NOTE — ED Triage Notes (Addendum)
Pt arrives appears very tired will open eyes and respond to verbal stimuli has R ear hearing aid on, no obvious deformities or injuries noted

## 2016-11-29 NOTE — ED Notes (Signed)
Patient transported to X-ray 

## 2016-11-30 DIAGNOSIS — M316 Other giant cell arteritis: Secondary | ICD-10-CM | POA: Diagnosis present

## 2016-11-30 DIAGNOSIS — Z880 Allergy status to penicillin: Secondary | ICD-10-CM | POA: Diagnosis not present

## 2016-11-30 DIAGNOSIS — Z79899 Other long term (current) drug therapy: Secondary | ICD-10-CM | POA: Diagnosis not present

## 2016-11-30 DIAGNOSIS — N390 Urinary tract infection, site not specified: Secondary | ICD-10-CM | POA: Diagnosis not present

## 2016-11-30 DIAGNOSIS — B9689 Other specified bacterial agents as the cause of diseases classified elsewhere: Secondary | ICD-10-CM | POA: Diagnosis present

## 2016-11-30 DIAGNOSIS — N3000 Acute cystitis without hematuria: Secondary | ICD-10-CM | POA: Diagnosis not present

## 2016-11-30 DIAGNOSIS — Z7984 Long term (current) use of oral hypoglycemic drugs: Secondary | ICD-10-CM | POA: Diagnosis not present

## 2016-11-30 DIAGNOSIS — E118 Type 2 diabetes mellitus with unspecified complications: Secondary | ICD-10-CM | POA: Diagnosis not present

## 2016-11-30 DIAGNOSIS — G9341 Metabolic encephalopathy: Secondary | ICD-10-CM | POA: Diagnosis present

## 2016-11-30 DIAGNOSIS — R6520 Severe sepsis without septic shock: Secondary | ICD-10-CM | POA: Diagnosis not present

## 2016-11-30 DIAGNOSIS — E785 Hyperlipidemia, unspecified: Secondary | ICD-10-CM | POA: Diagnosis present

## 2016-11-30 DIAGNOSIS — Z9861 Coronary angioplasty status: Secondary | ICD-10-CM | POA: Diagnosis not present

## 2016-11-30 DIAGNOSIS — I252 Old myocardial infarction: Secondary | ICD-10-CM | POA: Diagnosis not present

## 2016-11-30 DIAGNOSIS — R630 Anorexia: Secondary | ICD-10-CM | POA: Diagnosis present

## 2016-11-30 DIAGNOSIS — F0390 Unspecified dementia without behavioral disturbance: Secondary | ICD-10-CM | POA: Diagnosis present

## 2016-11-30 DIAGNOSIS — H9190 Unspecified hearing loss, unspecified ear: Secondary | ICD-10-CM | POA: Diagnosis present

## 2016-11-30 DIAGNOSIS — Z1619 Resistance to other specified beta lactam antibiotics: Secondary | ICD-10-CM | POA: Diagnosis present

## 2016-11-30 DIAGNOSIS — I2510 Atherosclerotic heart disease of native coronary artery without angina pectoris: Secondary | ICD-10-CM | POA: Diagnosis present

## 2016-11-30 DIAGNOSIS — Z955 Presence of coronary angioplasty implant and graft: Secondary | ICD-10-CM | POA: Diagnosis not present

## 2016-11-30 DIAGNOSIS — W19XXXA Unspecified fall, initial encounter: Secondary | ICD-10-CM | POA: Diagnosis present

## 2016-11-30 DIAGNOSIS — F329 Major depressive disorder, single episode, unspecified: Secondary | ICD-10-CM | POA: Diagnosis present

## 2016-11-30 DIAGNOSIS — H8110 Benign paroxysmal vertigo, unspecified ear: Secondary | ICD-10-CM | POA: Diagnosis present

## 2016-11-30 DIAGNOSIS — E876 Hypokalemia: Secondary | ICD-10-CM | POA: Diagnosis present

## 2016-11-30 DIAGNOSIS — Y92129 Unspecified place in nursing home as the place of occurrence of the external cause: Secondary | ICD-10-CM | POA: Diagnosis not present

## 2016-11-30 DIAGNOSIS — F05 Delirium due to known physiological condition: Secondary | ICD-10-CM | POA: Diagnosis not present

## 2016-11-30 DIAGNOSIS — I10 Essential (primary) hypertension: Secondary | ICD-10-CM | POA: Diagnosis not present

## 2016-11-30 DIAGNOSIS — E119 Type 2 diabetes mellitus without complications: Secondary | ICD-10-CM | POA: Diagnosis present

## 2016-11-30 DIAGNOSIS — Z882 Allergy status to sulfonamides status: Secondary | ICD-10-CM | POA: Diagnosis not present

## 2016-11-30 DIAGNOSIS — M810 Age-related osteoporosis without current pathological fracture: Secondary | ICD-10-CM | POA: Diagnosis present

## 2016-11-30 LAB — GLUCOSE, CAPILLARY
GLUCOSE-CAPILLARY: 251 mg/dL — AB (ref 65–99)
Glucose-Capillary: 118 mg/dL — ABNORMAL HIGH (ref 65–99)
Glucose-Capillary: 199 mg/dL — ABNORMAL HIGH (ref 65–99)
Glucose-Capillary: 118 mg/dL — ABNORMAL HIGH (ref 65–99)

## 2016-11-30 LAB — BASIC METABOLIC PANEL
ANION GAP: 8 (ref 5–15)
BUN: 10 mg/dL (ref 6–20)
CALCIUM: 9.1 mg/dL (ref 8.9–10.3)
CO2: 25 mmol/L (ref 22–32)
Chloride: 101 mmol/L (ref 101–111)
Creatinine, Ser: 0.77 mg/dL (ref 0.44–1.00)
GFR calc Af Amer: >60 mL/min (ref >60)
GFR calc non Af Amer: >60 mL/min (ref >60)
GLUCOSE: 135 mg/dL — AB (ref 65–99)
Potassium: 3.6 mmol/L (ref 3.5–5.1)
Sodium: 134 mmol/L — ABNORMAL LOW (ref 135–145)

## 2016-11-30 LAB — CBC
HCT: 32 % — ABNORMAL LOW (ref 36.0–46.0)
Hemoglobin: 10.5 g/dL — ABNORMAL LOW (ref 12.0–15.0)
MCH: 31.3 pg (ref 26.0–34.0)
MCHC: 32.8 g/dL (ref 30.0–36.0)
MCV: 95.2 fL (ref 78.0–100.0)
PLATELETS: 168 10*3/uL (ref 150–400)
RBC: 3.36 MIL/uL — AB (ref 3.87–5.11)
RDW: 14.1 % (ref 11.5–15.5)
WBC: 11.9 10*3/uL — AB (ref 4.0–10.5)

## 2016-11-30 MED ORDER — DEXTROSE 5 % IV SOLN
1.0000 g | INTRAVENOUS | Status: DC
  Administered 2016-12-01 – 2016-12-02 (×2): 1 g via INTRAVENOUS
  Filled 2016-11-30 (×2): qty 10

## 2016-11-30 MED ORDER — DEXTROSE 5 % IV SOLN: 1.0000 g | Freq: Once | INTRAVENOUS | Status: DC

## 2016-11-30 NOTE — Progress Notes (Signed)
PROGRESS NOTE    [name]  ERD:[id number] DOB: Feb 26, 1923 DOA: 11/29/2016 PCP: [name] Post, MD   Outpatient Specialists:     Brief Narrative:  [name] is a 83 y.o. female with medical history significant of CAD, diabetes, hypertension, MI, nephrolithiasis, temporal arteritis. Level V caveat applies this patient is unable to provide reliable history due to her confusion. EMS was called to evaluate patient and bring to the ED due to confusion reported by nursing home staff. Patient has been a resident at [facility] for the past 5 days after being transferred there after treatment for a wrist fracture. Patient fell the night prior to admission and was evaluated at [facility] with a CT scan of her head and neck which were unremarkable. Patient's weakness and confusion started around the time of return to the nursing home. Became progressively more lethargic. Found to have a UTI.  Patient is from ALF.  Assessment & Plan:   Active Problems:   TEMPORAL ARTERITIS   Essential hypertension   Hyperlipidemia   Acute encephalopathy   Diabetes mellitus with complication (HCC)   Acute lower UTI   UTI -culture with gram neg rods -continue rocephin Decreased appetite-- eating < 25%  DM -SSI  Acute encephalopathy Baseline: Per [facility] staff at baseline pt will follow commands and attempts to be conversive but is confused. Patient not interacting much -PT eval  HTN -continue home meds  Temporal arteritis -continue methotrexate     DVT prophylaxis:  SQ Heparin  Code Status: Full Code   Family Communication:   Disposition Plan:  Pt eval  Subjective: Patient asleep-- will open eyes but will not interact much -refusing to eat  Objective: Vitals:   11/29/16 2236 11/29/16 2353 11/30/16 0643 11/30/16 1439  BP: (!) 160/56  (!) 149/83 (!) 128/52  Pulse: (!) 101  86 91  Resp: 17  17 20   Temp: 100.2 F (37.9 C) 99.9 F (37.7 C) 97.9 F  (36.6 C) 99.5 F (37.5 C)  TempSrc: Oral Oral Oral Oral  SpO2:   94% 99%  Weight:      Height:        Intake/Output Summary (Last 24 hours) at 11/30/16 1511 Last data filed at 11/30/16 0600  Gross per 24 hour  Intake              400 ml  Output              250 ml  Net              150 ml   Filed Weights   11/29/16 1207 11/29/16 1553  Weight: 52.2 kg (115 lb) 50.4 kg (111 lb 1.6 oz)    Examination:  General exam: elderly appearing Respiratory system: Clear to auscultation. Respiratory effort normal. Cardiovascular system: S1 & S2 heard, RRR. No JVD, murmurs, rubs, gallops or clicks. No pedal edema. Gastrointestinal system: Abdomen is nondistended, soft and nontender. No organomegaly or masses felt. Normal bowel sounds heard. Central nervous system: will awaken, NAD    Data Reviewed: I have personally reviewed following labs and imaging studies  CBC:  Recent Labs Lab 11/29/16 1211 11/30/16 0600  WBC 12.3* 11.9*  HGB 11.6* 10.5*  HCT 34.9* 32.0*  MCV 94.6 95.2  PLT 159 563   Basic Metabolic Panel:  Recent Labs Lab 11/29/16 1211 11/30/16 0600  NA 135 134*  K 4.0 3.6  CL 98* 101  CO2 27 25  GLUCOSE 169* 135*  BUN 10 10  CREATININE 0.73 0.77  CALCIUM 9.8 9.1   GFR: Estimated Creatinine Clearance: 36.2 mL/min (by C-G formula based on SCr of 0.77 mg/dL). Liver Function Tests:  Recent Labs Lab 11/29/16 1211  AST 30  ALT 17  ALKPHOS 58  BILITOT 1.0  PROT 6.0*  ALBUMIN 3.2*   No results for input(s): LIPASE, AMYLASE in the last 168 hours. No results for input(s): AMMONIA in the last 168 hours. Coagulation Profile: No results for input(s): INR, PROTIME in the last 168 hours. Cardiac Enzymes: No results for input(s): CKTOTAL, CKMB, CKMBINDEX, TROPONINI in the last 168 hours. BNP (last 3 results) No results for input(s): PROBNP in the last 8760 hours. HbA1C: No results for input(s): HGBA1C in the last 72 hours. CBG:  Recent Labs Lab  11/29/16 1648 11/29/16 2113 11/30/16 0859 11/30/16 1150  GLUCAP 132* 132* 118* 199*   Lipid Profile: No results for input(s): CHOL, HDL, LDLCALC, TRIG, CHOLHDL, LDLDIRECT in the last 72 hours. Thyroid Function Tests: No results for input(s): TSH, T4TOTAL, FREET4, T3FREE, THYROIDAB in the last 72 hours. Anemia Panel: No results for input(s): VITAMINB12, FOLATE, FERRITIN, TIBC, IRON, RETICCTPCT in the last 72 hours. Urine analysis:    Component Value Date/Time   COLORURINE YELLOW 11/29/2016 1226   APPEARANCEUR CLOUDY (A) 11/29/2016 1226   LABSPEC 1.019 11/29/2016 1226   PHURINE 5.0 11/29/2016 1226   GLUCOSEU 50 (A) 11/29/2016 1226   HGBUR MODERATE (A) 11/29/2016 1226   BILIRUBINUR NEGATIVE 11/29/2016 1226   BILIRUBINUR neg 12/03/2013 1135   KETONESUR 20 (A) 11/29/2016 1226   PROTEINUR 100 (A) 11/29/2016 1226   UROBILINOGEN 0.2 12/03/2013 1135   UROBILINOGEN 1.0 11/14/2013 1325   NITRITE NEGATIVE 11/29/2016 1226   LEUKOCYTESUR SMALL (A) 11/29/2016 1226     ) Recent Results (from the past 240 hour(s))  Urine culture     Status: Abnormal (Preliminary result)   Collection Time: 11/29/16 12:26 PM  Result Value Ref Range Status   Specimen Description URINE, RANDOM  Final   Special Requests NONE  Final   Culture >=100,000 COLONIES/mL GRAM NEGATIVE RODS (A)  Final   Report Status PENDING  Incomplete      Anti-infectives    Start     Dose/Rate Route Frequency Ordered Stop   11/30/16 1300  cefTRIAXone (ROCEPHIN) 1 g in dextrose 5 % 50 mL IVPB     1 g 100 mL/hr over 30 Minutes Intravenous  Once 11/29/16 1605 11/30/16 1235   11/29/16 1345  cefTRIAXone (ROCEPHIN) 1 g in dextrose 5 % 50 mL IVPB     1 g 100 mL/hr over 30 Minutes Intravenous  Once 11/29/16 1334 11/29/16 1424       Radiology Studies: Dg Chest 2 View  Result Date: 11/29/2016 CLINICAL DATA:  Confusion. EXAM: CHEST  2 VIEW COMPARISON:  10/31/2015 FINDINGS: Cardiomediastinal silhouette is stably enlarged.  Tortuosity and calcific atherosclerotic disease of the aorta noted. There is a stable deviation of the tracheal air column at the thoracic inlet to the left. There is no evidence of focal airspace consolidation, pleural effusion or pneumothorax. Osseous structures are without acute abnormality. Osteoarthritic changes of bilateral glenohumeral joints. Soft tissues are grossly normal. IMPRESSION: Stable enlargement of the cardiac silhouette. Tortuosity and calcific atherosclerotic disease of the aorta. No evidence of active pulmonary process. Electronically Signed   By: Fidela Salisbury M.D.   On: 11/29/2016 12:51   Ct Head Wo Contrast  Result Date: 11/29/2016 CLINICAL DATA:  Initial evaluation for acute  trauma, fall. EXAM: CT HEAD WITHOUT CONTRAST CT CERVICAL SPINE WITHOUT CONTRAST TECHNIQUE: Multidetector CT imaging of the head and cervical spine was performed following the standard protocol without intravenous contrast. Multiplanar CT image reconstructions of the cervical spine were also generated. COMPARISON:  Prior CT from 10/31/2015. FINDINGS: CT HEAD FINDINGS Brain: Stable atrophy with chronic microvascular ischemic disease. No acute intracranial hemorrhage. No evidence for acute large vessel territory infarct. 2.2 cm calcified meningioma overlying the right frontal convexity without significant edema. No other mass lesion. No midline shift or mass effect. Ventricular prominence related to global parenchymal volume loss of hydrocephalus. No extra-axial fluid collection. Vascular: No hyperdense vessel. Scattered vascular calcifications noted within the carotid siphons. Skull: Scalp soft tissues within normal limits.  Calvarium intact. Sinuses/Orbits: Globes and orbital soft tissues within normal limits. Mild scattered mucosal thickening within the ethmoidal air cells and maxillary sinuses. Paranasal sinuses are otherwise clear. No mastoid effusion. CT CERVICAL SPINE FINDINGS Alignment: Straightening of  the normal cervical lordosis. Trace anterolisthesis of C2 on C3, C3 on C4, C4 on C5, and C7 on T1, likely due to chronic facet degeneration. Skull base and vertebrae: Skullbase intact. Normal C1-2 articulations preserved. Dens is intact. Vertebral body heights maintained. No acute fracture. Soft tissues and spinal canal: Visualized soft tissues of the neck demonstrate no acute abnormality. No prevertebral edema. Heterogeneous multinodular thyroid noted with prominent 2.4 cm right thyroid nodule, stable from previous. Disc levels: A advanced degenerative spondylolysis at C3-4 through C6-7. Multilevel facet arthrosis, stable. Upper chest: Visualized upper chest demonstrates no acute abnormality. No apical pneumothorax. IMPRESSION: CT BRAIN: 1. No acute intracranial process. 2. Stable approximate 2 cm meningioma overlying the right frontal convexity without associated edema. 3. Stable atrophy with chronic microvascular ischemic disease. CT CERVICAL SPINE: 1. No acute traumatic injury within the cervical spine. 2. Advanced degenerative spondylolysis at C3-4 through C6-7 with multilevel facet arthrosis. 3. Stable multinodular thyroid with dominant 2.4 cm right thyroid nodule. Again, thyroid ultrasound could be performed for further evaluation if clinically desired. Electronically Signed   By: Jeannine Boga M.D.   On: 11/29/2016 06:02   Ct Cervical Spine Wo Contrast  Result Date: 11/29/2016 CLINICAL DATA:  Initial evaluation for acute trauma, fall. EXAM: CT HEAD WITHOUT CONTRAST CT CERVICAL SPINE WITHOUT CONTRAST TECHNIQUE: Multidetector CT imaging of the head and cervical spine was performed following the standard protocol without intravenous contrast. Multiplanar CT image reconstructions of the cervical spine were also generated. COMPARISON:  Prior CT from 10/31/2015. FINDINGS: CT HEAD FINDINGS Brain: Stable atrophy with chronic microvascular ischemic disease. No acute intracranial hemorrhage. No evidence for  acute large vessel territory infarct. 2.2 cm calcified meningioma overlying the right frontal convexity without significant edema. No other mass lesion. No midline shift or mass effect. Ventricular prominence related to global parenchymal volume loss of hydrocephalus. No extra-axial fluid collection. Vascular: No hyperdense vessel. Scattered vascular calcifications noted within the carotid siphons. Skull: Scalp soft tissues within normal limits.  Calvarium intact. Sinuses/Orbits: Globes and orbital soft tissues within normal limits. Mild scattered mucosal thickening within the ethmoidal air cells and maxillary sinuses. Paranasal sinuses are otherwise clear. No mastoid effusion. CT CERVICAL SPINE FINDINGS Alignment: Straightening of the normal cervical lordosis. Trace anterolisthesis of C2 on C3, C3 on C4, C4 on C5, and C7 on T1, likely due to chronic facet degeneration. Skull base and vertebrae: Skullbase intact. Normal C1-2 articulations preserved. Dens is intact. Vertebral body heights maintained. No acute fracture. Soft tissues and spinal canal: Visualized soft  tissues of the neck demonstrate no acute abnormality. No prevertebral edema. Heterogeneous multinodular thyroid noted with prominent 2.4 cm right thyroid nodule, stable from previous. Disc levels: A advanced degenerative spondylolysis at C3-4 through C6-7. Multilevel facet arthrosis, stable. Upper chest: Visualized upper chest demonstrates no acute abnormality. No apical pneumothorax. IMPRESSION: CT BRAIN: 1. No acute intracranial process. 2. Stable approximate 2 cm meningioma overlying the right frontal convexity without associated edema. 3. Stable atrophy with chronic microvascular ischemic disease. CT CERVICAL SPINE: 1. No acute traumatic injury within the cervical spine. 2. Advanced degenerative spondylolysis at C3-4 through C6-7 with multilevel facet arthrosis. 3. Stable multinodular thyroid with dominant 2.4 cm right thyroid nodule. Again, thyroid  ultrasound could be performed for further evaluation if clinically desired. Electronically Signed   By: Jeannine Boga M.D.   On: 11/29/2016 06:02        Scheduled Meds: . atorvastatin  20 mg Oral q1800  . busPIRone  10 mg Oral BID  . heparin  5,000 Units Subcutaneous Q8H  . insulin aspart  0-5 Units Subcutaneous QHS  . insulin aspart  0-9 Units Subcutaneous TID WC  . latanoprost  1 drop Both Eyes QHS  . losartan  50 mg Oral Daily  . [START ON 12/05/2016] methotrexate  5 mg Oral Q Mon  . metoprolol succinate  25 mg Oral Daily   Continuous Infusions: . sodium chloride 75 mL/hr at 11/30/16 0933     LOS: 0 days    Time spent: 25 min    [place], DO [facility] Pager [phone]  If 7PM-7AM, please contact night-coverage [email or web address] Password [email or web address] 11/30/2016, 3:11 PM

## 2016-11-30 NOTE — Evaluation (Signed)
Physical Therapy Evaluation Patient Details Name: [name] MRN: [id number] DOB: 12/04/1922 Today's Date: 11/30/2016   History of Present Illness  pt is a 83 y/o female with pmh significant for HIV, CAD, DM, MI, admitted from [facility] where she has been for the past 5 days in rehab from wrist fracture due to progress weakness, confusion and lethargy.  pt found to have a UTI.  Clinical Impression  Pt admitted with/for confusion, lethargy due to UTI.  I was difficult to collect information due to confusion and extreme HOH, but pt needed min assist for basic transfers and min or more for gait with a RW.  Pt currently limited functionally due to the problems listed. ( See problems list.)   Pt will benefit from PT to maximize function and safety in order to get ready for next venue listed below.     Follow Up Recommendations SNF    Equipment Recommendations  Other (comment) (TBA)    Recommendations for Other Services       Precautions / Restrictions Precautions Precautions: Fall      Mobility  Bed Mobility               General bed mobility comments: NT, pt OOB in the chair  Transfers Overall transfer level: Needs assistance   Transfers: Sit to/from Stand;Stand Pivot Transfers Sit to Stand: Min assist Stand pivot transfers: Min assist       General transfer comment: visual cues for direction, stability assist  Ambulation/Gait Ambulation/Gait assistance: Min assist (needed 2 person assist for safety) Ambulation Distance (Feet): 20 Feet Assistive device: Rolling walker (2 wheeled) Gait Pattern/deviations: Step-through pattern;Decreased step length - right;Decreased step length - left;Decreased stride length   Gait velocity interpretation: Below normal speed for age/gender General Gait Details: short weak-kneed steps, flexed posture and poor use of the RW.  Stairs            Wheelchair Mobility    Modified Rankin (Stroke Patients Only)        Balance Overall balance assessment: Needs assistance Sitting-balance support: No upper extremity supported Sitting balance-Leahy Scale: Fair       Standing balance-Leahy Scale: Poor Standing balance comment: needs external support or AD                             Pertinent Vitals/Pain Pain Assessment: Faces Faces Pain Scale: No hurt    Home Living Family/patient expects to be discharged to:: Skilled nursing facility                 Additional Comments: unable to get PLOF from patient    Prior Function           Comments: pt said she walked...     Hand Dominance        Extremity/Trunk Assessment   Upper Extremity Assessment Upper Extremity Assessment: Defer to OT evaluation    Lower Extremity Assessment Lower Extremity Assessment: Generalized weakness       Communication   Communication: HOH (almost deaf without a working hearing aide)  Cognition Arousal/Alertness: Awake/alert Behavior During Therapy: Restless Overall Cognitive Status: Impaired/Different from baseline Area of Impairment: Attention;Memory;Following commands;Safety/judgement;Problem solving                   Current Attention Level: Focused Memory: Decreased short-term memory Following Commands: Follows one step commands inconsistently Safety/Judgement: Decreased awareness of safety;Decreased awareness of deficits   Problem Solving: Slow processing;Decreased  initiation        General Comments      Exercises     Assessment/Plan    PT Assessment Patient needs continued PT services  PT Problem List Decreased strength;Decreased activity tolerance;Decreased balance;Decreased mobility;Decreased knowledge of use of DME;Decreased cognition       PT Treatment Interventions Gait training;Functional mobility training;Therapeutic activities;Balance training;Patient/family education    PT Goals (Current goals can be found in the Care Plan section)  Acute Rehab  PT Goals Patient Stated Goal: I want to go home PT Goal Formulation: Patient unable to participate in goal setting Time For Goal Achievement: 12/14/16 Potential to Achieve Goals: Good    Frequency Min 2X/week   Barriers to discharge        Co-evaluation               End of Session   Activity Tolerance: Patient tolerated treatment well;Patient limited by fatigue Patient left: in chair;with chair alarm set;with call bell/phone within reach Nurse Communication: Mobility status PT Visit Diagnosis: Unsteadiness on feet (R26.81);Other abnormalities of gait and mobility (R26.89);History of falling (Z91.81)    Time: 1798-1025 PT Time Calculation (min) (ACUTE ONLY): 27 min   Charges:   PT Evaluation $PT Eval Moderate Complexity: 1 Procedure PT Treatments $Gait Training: 8-22 mins   PT G Codes:        12/18/2016  [name], PT [phone]  (pager)  [name] 2016-12-18, 6:12 PM

## 2016-12-01 LAB — GLUCOSE, CAPILLARY
GLUCOSE-CAPILLARY: 110 mg/dL — AB (ref 65–99)
GLUCOSE-CAPILLARY: 127 mg/dL — AB (ref 65–99)
Glucose-Capillary: 144 mg/dL — ABNORMAL HIGH (ref 65–99)
Glucose-Capillary: 121 mg/dL — ABNORMAL HIGH (ref 65–99)

## 2016-12-01 LAB — CBC
HEMATOCRIT: 30.1 % — AB (ref 36.0–46.0)
Hemoglobin: 9.7 g/dL — ABNORMAL LOW (ref 12.0–15.0)
MCH: 30.9 pg (ref 26.0–34.0)
MCHC: 32.2 g/dL (ref 30.0–36.0)
MCV: 95.9 fL (ref 78.0–100.0)
PLATELETS: 179 10*3/uL (ref 150–400)
RBC: 3.14 MIL/uL — AB (ref 3.87–5.11)
RDW: 14.2 % (ref 11.5–15.5)
WBC: 11.7 10*3/uL — ABNORMAL HIGH (ref 4.0–10.5)

## 2016-12-01 LAB — BASIC METABOLIC PANEL
Anion gap: 9 (ref 5–15)
BUN: 12 mg/dL (ref 6–20)
CO2: 24 mmol/L (ref 22–32)
Calcium: 8.8 mg/dL — ABNORMAL LOW (ref 8.9–10.3)
Chloride: 103 mmol/L (ref 101–111)
Creatinine, Ser: 0.73 mg/dL (ref 0.44–1.00)
GFR calc Af Amer: >60 mL/min (ref >60)
GLUCOSE: 155 mg/dL — AB (ref 65–99)
POTASSIUM: 3.3 mmol/L — AB (ref 3.5–5.1)
Sodium: 136 mmol/L (ref 135–145)

## 2016-12-01 LAB — URINE CULTURE

## 2016-12-01 MED ORDER — POTASSIUM CHLORIDE IN NACL 20-0.9 MEQ/L-% IV SOLN
INTRAVENOUS | Status: DC
  Administered 2016-12-01 (×2): via INTRAVENOUS
  Filled 2016-12-01 (×3): qty 1000

## 2016-12-01 NOTE — Evaluation (Signed)
Clinical/Bedside Swallow Evaluation Patient Details  Name: [name] MRN: [id number] Date of Birth: 1922-10-21  Today's Date: 12/01/2016 Time: SLP Start Time (ACUTE ONLY): 59 SLP Stop Time (ACUTE ONLY): 1600 SLP Time Calculation (min) (ACUTE ONLY): 30 min  Past Medical History:  Past Medical History:  Diagnosis Date  . BPPV (benign paroxysmal positional vertigo)   . CAD (coronary artery disease)    a. Inferior STEMI s/p BMSx2 to RCA. Coronary anomaly with single coronary ostium arising from right sinus of Valsalva and likely LM course between PA and aorta.  . Carpal tunnel syndrome 08/04/2009  . Diabetes mellitus ([place])   . Essential hypertension   . [place], [place] 03/26/2010  . INCONTINENCE, STRESS, FEMALE 10/12/2006  . Inferior MI ([place]) 04/21/2011  . 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 H/o Left eye hemianopsia (longstanding).  . Kidney stone   . Multinodular goiter   . Osteoporosis, unspecified 10/12/2006  . RHINITIS, ALLERGIC 10/12/2006  . Single kidney    a. Absence of right kidney by prior imaging.  . TEMPORAL ARTERITIS 10/12/2006  . UNSTEADY GAIT 01/22/2007   Past Surgical History:  Past Surgical History:  Procedure Laterality Date  . ABDOMINAL HYSTERECTOMY    . CATARACT EXTRACTION, BILATERAL    . CORONARY ANGIOPLASTY     HPI:  [name] is a 83 y.o. female with medical history significant of HIV, CAD, diabetes, hypertension, MI, nephrolithiasis, temporal arteritis. Level V caveat applies this patient is unable to provide reliable history due to her confusion. EMS was called to evaluate patient and bring to the ED due to confusion reported by nursing home staff. Patient has been a resident at [facility] for the past 5 days after being transferred there after treatment for a wrist fracture. Patient fell the night prior to admission and was evaluated at [facility] with a CT scan of her head and neck which were unremarkable. Patient's weakness and  confusion started around the time of return to the nursing home. Became progressively more lethargic. No other focal antibodies were noted by nursing home staff such as fevers, abdominal pain, complaints of dysuria or frequency, rash, LOC, dizziness, chest pain, shortness breath or palpitations.   Assessment / Plan / Recommendation Clinical Impression  Pt appears at mild risk for aspiration when following general aspiration precautions. Pt consumed trials of regular snack and thin liquids via cup and straw without overt s/s of aspiration. Recommend continuing current diet and full nursing superivison given need to assist with feeding and pt's mentation. ST to sign off at this time.  SLP Visit Diagnosis: Dysphagia, oropharyngeal phase (R13.12)    Aspiration Risk  Mild aspiration risk    Diet Recommendation Regular;Thin liquid   Liquid Administration via: Cup;Straw Medication Administration: Whole meds with liquid Supervision: Staff to assist with self feeding;Full supervision/cueing for compensatory strategies Compensations: Minimize environmental distractions;Slow rate;Small sips/bites Postural Changes: Seated upright at 90 degrees    Other  Recommendations Oral Care Recommendations: Oral care BID   Follow up Recommendations None       Swallow Study   General Date of Onset: 11/29/16 HPI: [name] is a 83 y.o. female with medical history significant of HIV, CAD, diabetes, hypertension, MI, nephrolithiasis, temporal arteritis. Level V caveat applies this patient is unable to provide reliable history due to her confusion. EMS was called to evaluate patient and bring to the ED due to confusion reported by nursing home staff. Patient has been a  resident at [facility] for the past 5 days after being transferred there after treatment for a wrist fracture. Patient fell the night prior to admission and was evaluated at [facility] with a CT scan of her head and neck which were  unremarkable. Patient's weakness and confusion started around the time of return to the nursing home. Became progressively more lethargic. No other focal antibodies were noted by nursing home staff such as fevers, abdominal pain, complaints of dysuria or frequency, rash, LOC, dizziness, chest pain, shortness breath or palpitations. Type of Study: Bedside Swallow Evaluation Previous Swallow Assessment: None in chart (None in chart) Diet Prior to this Study: Regular;Thin liquids Temperature Spikes Noted: No Respiratory Status: Room air History of Recent Intubation: No Behavior/Cognition: Alert;Cooperative;Pleasant mood Oral Cavity Assessment: Within Functional Limits Oral Care Completed by SLP: Recent completion by staff Oral Cavity - Dentition: Adequate natural dentition Self-Feeding Abilities: Needs assist;Needs set up Patient Positioning: Upright in bed Baseline Vocal Quality: Normal Volitional Cough: Strong Volitional Swallow: Able to elicit    Oral/Motor/Sensory Function Overall Oral Motor/Sensory Function: Within functional limits   Ice Chips Ice chips: Not tested   Thin Liquid Thin Liquid: Within functional limits Presentation: Cup;Straw    Nectar Thick Nectar Thick Liquid: Not tested   Honey Thick Honey Thick Liquid: Not tested   Puree Puree: Not tested   Solid   GO   Solid: Within functional limits       [name] B. Rutherford Nail, M.S., [place] 12/01/2016,4:02 PM

## 2016-12-01 NOTE — Progress Notes (Signed)
PROGRESS NOTE    [name]  WCB:[id number] DOB: [birth year] DOA: 11/29/2016 PCP: Eulas Post, MD   Outpatient Specialists:     Brief Narrative:  [name] is a 83 y.o. female with medical history significant of CAD, diabetes, hypertension, MI, nephrolithiasis, temporal arteritis. Level V caveat applies this patient is unable to provide reliable history due to her confusion. EMS was called to evaluate patient and bring to the ED due to confusion reported by nursing home staff. Patient has been a resident at [facility] for the past 5 days after being transferred there after treatment for a wrist fracture. Patient fell the night prior to admission and was evaluated at [facility] with a CT scan of her head and neck which were unremarkable. Patient's weakness and confusion started around the time of return to the nursing home. Became progressively more lethargic. Found to have a UTI.  Patient is from ALF.  Assessment & Plan:   Active Problems:   Essential hypertension   Hyperlipidemia   Acute encephalopathy   Diabetes mellitus with complication (HCC)   Acute lower UTI   UTI (urinary tract infection)   UTI -culture with citrobacter fredundii -continue rocephin Decreased appetite-- eating < 25%  DM -SSI  Acute encephalopathy Baseline: Per [facility] staff at baseline pt will follow commands and attempts to be conversive but is confused. Patient not interacting much nor eatinf -PT eval recommends SNF  HTN -continue home meds  Temporal arteritis -continue methotrexate  Hypokalemia -replete    DVT prophylaxis:  SQ Heparin  Code Status: Full Code   Family Communication:   Disposition Plan:  Pt eval  Subjective: patient very hard of hearing and not eating  Objective: Vitals:   11/30/16 1439 11/30/16 2231 11/30/16 2233 12/01/16 0005  BP: (!) 128/52 (!) 186/73 (!) 187/97 (!) 115/43  Pulse: 91 (!) 104 (!) 103 90  Resp: 20 16      Temp: 99.5 F (37.5 C) 98.8 F (37.1 C)    TempSrc: Oral     SpO2: 99% 93% 93%   Weight:      Height:        Intake/Output Summary (Last 24 hours) at 12/01/16 1012 Last data filed at 12/01/16 0557  Gross per 24 hour  Intake              950 ml  Output                0 ml  Net              950 ml   Filed Weights   11/29/16 1207 11/29/16 1553  Weight: 52.2 kg (115 lb) 50.4 kg (111 lb 1.6 oz)    Examination:  General exam: appears stated age- dry mucous membranes Respiratory system: Clear to auscultation. Respiratory effort normal. Cardiovascular system: S1 & S2 heard, RRR. No JVD, murmurs, rubs, gallops or clicks. No pedal edema. Gastrointestinal system: Abdomen is nondistended, soft and nontender. No organomegaly or masses felt. Normal bowel sounds heard. Central nervous system: will awaken, NAD    Data Reviewed: I have personally reviewed following labs and imaging studies  CBC:  Recent Labs Lab 11/29/16 1211 11/30/16 0600 12/01/16 0436  WBC 12.3* 11.9* 11.7*  HGB 11.6* 10.5* 9.7*  HCT 34.9* 32.0* 30.1*  MCV 94.6 95.2 95.9  PLT 159 168 616   Basic Metabolic Panel:  Recent Labs Lab 11/29/16 1211 11/30/16 0600 12/01/16 0436  NA 135 134* 136  K  4.0 3.6 3.3*  CL 98* 101 103  CO2 27 25 24   GLUCOSE 169* 135* 155*  BUN 10 10 12   CREATININE 0.73 0.77 0.73  CALCIUM 9.8 9.1 8.8*   GFR: Estimated Creatinine Clearance: 36.2 mL/min (by C-G formula based on SCr of 0.73 mg/dL). Liver Function Tests:  Recent Labs Lab 11/29/16 1211  AST 30  ALT 17  ALKPHOS 58  BILITOT 1.0  PROT 6.0*  ALBUMIN 3.2*   No results for input(s): LIPASE, AMYLASE in the last 168 hours. No results for input(s): AMMONIA in the last 168 hours. Coagulation Profile: No results for input(s): INR, PROTIME in the last 168 hours. Cardiac Enzymes: No results for input(s): CKTOTAL, CKMB, CKMBINDEX, TROPONINI in the last 168 hours. BNP (last 3 results) No results for input(s): PROBNP  in the last 8760 hours. HbA1C: No results for input(s): HGBA1C in the last 72 hours. CBG:  Recent Labs Lab 11/30/16 0859 11/30/16 1150 11/30/16 1730 11/30/16 2226 12/01/16 0812  GLUCAP 118* 199* 251* 118* 110*   Lipid Profile: No results for input(s): CHOL, HDL, LDLCALC, TRIG, CHOLHDL, LDLDIRECT in the last 72 hours. Thyroid Function Tests: No results for input(s): TSH, T4TOTAL, FREET4, T3FREE, THYROIDAB in the last 72 hours. Anemia Panel: No results for input(s): VITAMINB12, FOLATE, FERRITIN, TIBC, IRON, RETICCTPCT in the last 72 hours. Urine analysis:    Component Value Date/Time   COLORURINE YELLOW 11/29/2016 1226   APPEARANCEUR CLOUDY (A) 11/29/2016 1226   LABSPEC 1.019 11/29/2016 1226   PHURINE 5.0 11/29/2016 1226   GLUCOSEU 50 (A) 11/29/2016 1226   HGBUR MODERATE (A) 11/29/2016 1226   BILIRUBINUR NEGATIVE 11/29/2016 1226   BILIRUBINUR neg 12/03/2013 1135   KETONESUR 20 (A) 11/29/2016 1226   PROTEINUR 100 (A) 11/29/2016 1226   UROBILINOGEN 0.2 12/03/2013 1135   UROBILINOGEN 1.0 11/14/2013 1325   NITRITE NEGATIVE 11/29/2016 1226   LEUKOCYTESUR SMALL (A) 11/29/2016 1226     ) Recent Results (from the past 240 hour(s))  Urine culture     Status: Abnormal   Collection Time: 11/29/16 12:26 PM  Result Value Ref Range Status   Specimen Description URINE, RANDOM  Final   Special Requests NONE  Final   Culture >=100,000 COLONIES/mL CITROBACTER FREUNDII (A)  Final   Report Status 12/01/2016 FINAL  Final   Organism ID, Bacteria CITROBACTER FREUNDII (A)  Final      Susceptibility   Citrobacter freundii - MIC*    CEFAZOLIN >=64 RESISTANT Resistant     CEFTRIAXONE <=1 SENSITIVE Sensitive     CIPROFLOXACIN <=0.25 SENSITIVE Sensitive     GENTAMICIN <=1 SENSITIVE Sensitive     IMIPENEM <=0.25 SENSITIVE Sensitive     NITROFURANTOIN <=16 SENSITIVE Sensitive     TRIMETH/SULFA <=20 SENSITIVE Sensitive     PIP/TAZO <=4 SENSITIVE Sensitive     * >=100,000 COLONIES/mL  CITROBACTER FREUNDII      Anti-infectives    Start     Dose/Rate Route Frequency Ordered Stop   12/01/16 1600  cefTRIAXone (ROCEPHIN) 1 g in dextrose 5 % 50 mL IVPB  Status:  Discontinued     1 g 100 mL/hr over 30 Minutes Intravenous  Once 11/30/16 1642 11/30/16 1648   12/01/16 1600  cefTRIAXone (ROCEPHIN) 1 g in dextrose 5 % 50 mL IVPB     1 g 100 mL/hr over 30 Minutes Intravenous Every 24 hours 11/30/16 1648     11/30/16 1300  cefTRIAXone (ROCEPHIN) 1 g in dextrose 5 % 50 mL IVPB  1 g 100 mL/hr over 30 Minutes Intravenous  Once 11/29/16 1605 11/30/16 1235   11/29/16 1345  cefTRIAXone (ROCEPHIN) 1 g in dextrose 5 % 50 mL IVPB     1 g 100 mL/hr over 30 Minutes Intravenous  Once 11/29/16 1334 11/29/16 1424       Radiology Studies: Dg Chest 2 View  Result Date: 11/29/2016 CLINICAL DATA:  Confusion. EXAM: CHEST  2 VIEW COMPARISON:  10/31/2015 FINDINGS: Cardiomediastinal silhouette is stably enlarged. Tortuosity and calcific atherosclerotic disease of the aorta noted. There is a stable deviation of the tracheal air column at the thoracic inlet to the left. There is no evidence of focal airspace consolidation, pleural effusion or pneumothorax. Osseous structures are without acute abnormality. Osteoarthritic changes of bilateral glenohumeral joints. Soft tissues are grossly normal. IMPRESSION: Stable enlargement of the cardiac silhouette. Tortuosity and calcific atherosclerotic disease of the aorta. No evidence of active pulmonary process. Electronically Signed   By: Fidela Salisbury M.D.   On: 11/29/2016 12:51        Scheduled Meds: . atorvastatin  20 mg Oral q1800  . busPIRone  10 mg Oral BID  . heparin  5,000 Units Subcutaneous Q8H  . insulin aspart  0-5 Units Subcutaneous QHS  . insulin aspart  0-9 Units Subcutaneous TID WC  . latanoprost  1 drop Both Eyes QHS  . losartan  50 mg Oral Daily  . [START ON 12/05/2016] methotrexate  5 mg Oral Q Mon  . metoprolol succinate  25  mg Oral Daily   Continuous Infusions: . 0.9 % NaCl with KCl 20 mEq / L 75 mL/hr at 12/01/16 1009  . cefTRIAXone (ROCEPHIN)  IV       LOS: 1 day    Time spent: 25 min    [place], DO [facility] Pager [phone]  If 7PM-7AM, please contact night-coverage [email or web address] Password [email or web address] 12/01/2016, 10:12 AM

## 2016-12-01 NOTE — Evaluation (Signed)
Occupational Therapy Evaluation Patient Details Name: [name] MRN: [id number] DOB: Dec 31, 1922 Today's Date: 12/01/2016    History of Present Illness pt is a 83 y/o female with pmh significant for HIV, CAD, DM, MI, admitted from [facility] where she has been for the past 5 days in rehab from wrist fracture due to progress weakness, confusion and lethargy.  pt found to have a UTI.   Clinical Impression   Unsure of PLOF with ADL; per family pt ambulating with cane PTA. Currently pt requires max assist for stand pivot transfer and mod-max assist for ADL. Pt is very HOH and difficulty to determine if she is cognitively impaired or cannot hear cues/directions for tasks. Recommending SNF for follow up to maximize independence and safety with ADL and functional mobility. Pt would benefit from continued skilled OT to address established goals.    Follow Up Recommendations  SNF;Supervision/Assistance - 24 hour    Equipment Recommendations  Other (comment) (TBD at next venue)    Recommendations for Other Services       Precautions / Restrictions Precautions Precautions: Fall Restrictions Weight Bearing Restrictions: No      Mobility Bed Mobility Overal bed mobility: Needs Assistance Bed Mobility: Supine to Sit     Supine to sit: Mod assist     General bed mobility comments: Assist for LEs to EOB, trunk elevation and scooting hips out to EOB  Transfers Overall transfer level: Needs assistance Equipment used: 1 person hand held assist Transfers: Sit to/from [facility] Sit to Stand: Max assist Stand pivot transfers: Max assist       General transfer comment: Max assist to boost up from EOB x1, BSC x1 and perform stand pivot x2. Visual, tactile, and verbal cues provided    Balance Overall balance assessment: Needs assistance Sitting-balance support: Feet supported;Single extremity supported Sitting balance-Leahy Scale: Fair       Standing  balance-Leahy Scale: Poor                             ADL either performed or assessed with clinical judgement   ADL Overall ADL's : Needs assistance/impaired     Grooming: Minimal assistance;Sitting   Upper Body Bathing: Moderate assistance;Sitting   Lower Body Bathing: Total assistance;Sit to/from stand   Upper Body Dressing : Moderate assistance;Sitting   Lower Body Dressing: Total assistance;Sit to/from stand   Toilet Transfer: Maximal assistance;Stand-pivot;BSC   Toileting- Clothing Manipulation and Hygiene: Total assistance       Functional mobility during ADLs: Maximal assistance (for stand pivot only)       Vision         Perception     Praxis      Pertinent Vitals/Pain Pain Assessment: Faces Faces Pain Scale: Hurts even more Pain Location: back Pain Descriptors / Indicators: Grimacing;Guarding;Sore Pain Intervention(s): Monitored during session;Limited activity within patient's tolerance;Repositioned     Hand Dominance     Extremity/Trunk Assessment Upper Extremity Assessment Upper Extremity Assessment: Generalized weakness   Lower Extremity Assessment Lower Extremity Assessment: Defer to PT evaluation   Cervical / Trunk Assessment Cervical / Trunk Assessment: Kyphotic   Communication Communication Communication: HOH (almost deaf)   Cognition Arousal/Alertness: Awake/alert Behavior During Therapy: WFL for tasks assessed/performed Overall Cognitive Status: Difficult to assess  General Comments: Pt with difficulty following commands; unsure if cognition or related to difficulty hearing.   General Comments       Exercises     Shoulder Instructions      Home Living Family/patient expects to be discharged to:: Skilled nursing facility                                 Additional Comments: Family present but reluctant to provide information regarding pts PLOF or home  set up.      Prior Functioning/Environment          Comments: Daughter reports pt ambulated with a cane PTA.        OT Problem List: Decreased strength;Decreased activity tolerance;Impaired balance (sitting and/or standing);Decreased cognition;Decreased safety awareness;Decreased knowledge of use of DME or AE;Pain      OT Treatment/Interventions: Self-care/ADL training;Therapeutic exercise;Energy conservation;DME and/or AE instruction;Therapeutic activities;Patient/family education;Balance training    OT Goals(Current goals can be found in the care plan section) Acute Rehab OT Goals Patient Stated Goal: none stated OT Goal Formulation: With patient/family Time For Goal Achievement: 12/15/16 Potential to Achieve Goals: Good ADL Goals Pt Will Perform Grooming: with set-up;with supervision;sitting Pt Will Perform Upper Body Bathing: with set-up;with supervision;sitting Pt Will Perform Lower Body Bathing: with min assist;sit to/from stand Pt Will Transfer to Toilet: with min assist;ambulating;bedside commode (over toilet) Pt Will Perform Toileting - Clothing Manipulation and hygiene: with min assist;sit to/from stand  OT Frequency: Min 2X/week   Barriers to D/C:            Co-evaluation              End of Session Nurse Communication: Mobility status  Activity Tolerance: Patient tolerated treatment well Patient left: in chair;with call bell/phone within reach;with chair alarm set;with nursing/sitter in room;with family/visitor present  OT Visit Diagnosis: Unsteadiness on feet (R26.81);Other abnormalities of gait and mobility (R26.89);Muscle weakness (generalized) (M62.81)                Time: 5993-5701 OT Time Calculation (min): 16 min Charges:  OT General Charges $OT Visit: 1 Procedure OT Evaluation $OT Eval Moderate Complexity: 1 Procedure G-Codes:     Zohal Reny A. Ulice Brilliant, M.S., OTR/L Pager: [place] 12/01/2016, 1:47 PM

## 2016-12-01 NOTE — Consult Note (Signed)
           [facility] CM Primary Care Navigator  12/01/2016  [name] October 22, 1922 [id number]   Went to see patient in the room to identify possible discharge needs but she was fast asleep. No family members seen in the room.  Will attempt to meet with patient at another time when she is awake and available.  For questions, please contact:  Dannielle Huh, BSN, RN- [facility] Primary Care Navigator  Telephone: [phone] [place]

## 2016-12-01 NOTE — Clinical Social Work Note (Signed)
Clinical Social Work Assessment  Patient Details  Name: [name] MRN: [id number] Date of Birth: 08-21-1922  Date of referral:  12/01/16               Reason for consult:  Discharge Planning                Permission sought to share information with:  Facility Sport and exercise psychologist, Family Supports Permission granted to share information::  No  Name::     Advertising account planner::  [facility]  Relationship::  Son  Contact Information:  [phone]  Housing/Transportation Living arrangements for the past 2 months:  [place], [place] of Information:  Adult Children Patient Interpreter Needed:  None Criminal Activity/Legal Involvement Pertinent to Current Situation/Hospitalization:  No - Comment as needed Significant Relationships:  Adult Children Lives with:  Facility Resident Do you feel safe going back to the place where you live?  Yes Need for family participation in patient care:  Yes (Comment)  Care giving concerns:  CSW received consult regarding discharge planning. Patient is disoriented. CSW spoke with patient's son and POA, Fara Olden. Patient has spent the last week at [place] [facility] and will return at discharge. She was receiving PT and OT while there. CSW to continue to follow and assist with discharge planning needs.   Social Worker assessment / plan:  CSW spoke with patient's son regarding discharge back to ALF.  Employment status:  Retired Forensic scientist:  Medicare PT Recommendations:  [place] / Referral to community resources:     Patient/Family's Response to care: Patient's son is appreciative of the hospital's help in transitioning the patient back to ALF. He reported that he does not want patient's daughter, [name], or any other family members to know anything about the patient.   Patient/Family's Understanding of and Emotional Response to Diagnosis, Current Treatment, and Prognosis:  Patient/family is  realistic regarding therapy needs and expressed being hopeful for return to ALF. Patient's family expressed understanding of CSW role and discharge process. They are aware of patient's condition and are hopeful for improvement. No questions/concerns about plan or treatment.    Emotional Assessment Appearance:  Appears stated age Attitude/Demeanor/Rapport:  Unable to Assess Affect (typically observed):  Unable to Assess Orientation:  Oriented to Self Alcohol / Substance use:  Not Applicable Psych involvement (Current and /or in the community):  No (Comment)  Discharge Needs  Concerns to be addressed:  Care Coordination Readmission within the last 30 days:  No Current discharge risk:  None Barriers to Discharge:  Continued Medical Work up   [employer], [place] 12/01/2016, 8:57 AM

## 2016-12-02 LAB — GLUCOSE, CAPILLARY
GLUCOSE-CAPILLARY: 102 mg/dL — AB (ref 65–99)
Glucose-Capillary: 139 mg/dL — ABNORMAL HIGH (ref 65–99)

## 2016-12-02 MED ORDER — LEVOFLOXACIN 500 MG PO TABS: 500.0000 mg | ORAL_TABLET | Freq: Every day | ORAL | 0 refills | Status: AC

## 2016-12-02 MED ORDER — POTASSIUM CHLORIDE 20 MEQ PO PACK
20.0000 meq | PACK | Freq: Once | ORAL | Status: AC
  Administered 2016-12-02: 20 meq via ORAL
  Filled 2016-12-02 (×2): qty 1

## 2016-12-02 NOTE — [place] FL2 (Signed)
[place] MEDICAID FL2 LEVEL OF CARE SCREENING TOOL     IDENTIFICATION  Patient Name: [name] Birthdate: 24-Sep-1922 Sex: female Admission Date (Current Location): 11/29/2016  [facility] and [place] Number:  Herbalist and Address:  [facility], [place] [address], [place], [place] [address]      Provider Number: [id number]  Attending Physician Name and Address:  Rosita Fire, MD  Relative Name and Phone Number:  Fara Olden, son, [phone]    Current Level of Care: Hospital Recommended Level of Care: [place] Prior Approval Number:    Date Approved/Denied:   PASRR Number:    Discharge Plan: Other (Comment) (ALF)    Current Diagnoses: Patient Active Problem List   Diagnosis Date Noted  . UTI (urinary tract infection) 11/30/2016  . Diabetes mellitus with complication ([place]) 56/21/3086  . Acute lower UTI 11/29/2016  . Acute confusion due to infection   . Acute cystitis without hematuria   . Recurrent falls 10/05/2015  . Acute encephalopathy 09/24/2015  . Fever, myalgia, and generalized weakness 09/24/2015  . Hyperlipidemia 06/02/2015  . PMR (polymyalgia rheumatica) (HCC) 12/17/2014  . Vertigo 10/12/2014  . Essential hypertension   . Single kidney   . Poorly controlled type 2 diabetes mellitus ([place]) 02/26/2013  . CAD (coronary artery disease) 04/21/2011  . [place], [place] 03/26/2010  . CARPAL TUNNEL SYNDROME 08/04/2009  . UNSTEADY GAIT 01/22/2007  . TEMPORAL ARTERITIS 10/12/2006  . RHINITIS, ALLERGIC 10/12/2006  . OSTEOPOROSIS, UNSPECIFIED 10/12/2006    Orientation RESPIRATION BLADDER Height & Weight     Self  Normal Incontinent Weight: 50.4 kg (111 lb 1.6 oz) Height:  5\' 2"  (157.5 cm)  BEHAVIORAL SYMPTOMS/MOOD NEUROLOGICAL BOWEL NUTRITION STATUS      Continent  (No added table salt)  AMBULATORY STATUS COMMUNICATION OF NEEDS Skin   Limited Assist Verbally Normal                       Personal  Care Assistance Level of Assistance  Bathing, Feeding, Dressing Bathing Assistance: Maximum assistance Feeding assistance: Limited assistance Dressing Assistance: Limited assistance     Functional Limitations Info             SPECIAL CARE FACTORS FREQUENCY  PT (By licensed PT), OT (By licensed OT)     PT Frequency: min 3x/week OT Frequency: 3x/week            Contractures      Additional Factors Info  Code Status, Allergies, Psychotropic, Insulin Sliding Scale Code Status Info: Full Allergies Info: Penicillins, Sulfadiazine Psychotropic Info: Buspar Insulin Sliding Scale Info: 3x daily with meals and at bedtime       Current Medications (12/02/2016):    Discharge Medications: TAKE these medications   acetaminophen 500 MG tablet Commonly known as:  TYLENOL Take 500 mg by mouth every 4 (four) hours as needed (for headache, doscomfort, or fever of 99.5-101 F).   atorvastatin 20 MG tablet Commonly known as:  LIPITOR TAKE 1 TABLET (20 MG TOTAL) BY MOUTH DAILY. What changed:  See the new instructions.   azelastine 0.1 % nasal spray Commonly known as:  ASTELIN USE 2 SPRAYS IN EACH NOSTRILTWICE DAILY   busPIRone 10 MG tablet Commonly known as:  BUSPAR Take 10 mg by mouth 2 (two) times daily.   guaifenesin 100 MG/5ML syrup Commonly known as:  ROBITUSSIN Take 200 mg by mouth every 6 (six) hours as needed for cough.   HYDROcodone-acetaminophen 5-325  MG tablet Commonly known as:  NORCO/VICODIN Take 1 tablet by mouth daily as needed (for pain).   levofloxacin 500 MG tablet Commonly known as:  LEVAQUIN Take 1 tablet (500 mg total) by mouth daily. For 3 days only.   loperamide 2 MG capsule Commonly known as:  IMODIUM Take 2 mg by mouth See admin instructions. With each loose stool as needed for diarrhea (max 8 doses/24 hours)   losartan 50 MG tablet Commonly known as:  COZAAR TAKE 1 TABLET BY MOUTH EVERY DAY   magnesium hydroxide 400 MG/5ML  suspension Commonly known as:  MILK OF MAGNESIA Take 30 mLs by mouth at bedtime as needed for mild constipation.   metFORMIN 750 MG 24 hr tablet Commonly known as:  GLUCOPHAGE-XR TAKE 1 TABLET DAILY WITH BREAKFAST   methotrexate 2.5 MG tablet Commonly known as:  RHEUMATREX TAKE 2 TABLETS BY MOUTH ONCE A WEEK What changed:  See the new instructions.   metoprolol succinate 25 MG 24 hr tablet Commonly known as:  TOPROL-XL TAKE 1 TABLET (25 MG TOTAL) BY MOUTH DAILY.   [place] 200-200-20 MG/5ML suspension Generic drug:  alum & mag hydroxide-simeth Take 30 mLs by mouth every 6 (six) hours as needed for indigestion or heartburn. NOT TO EXCEED 4 DOSES/24 HOURS   nitroGLYCERIN 0.4 MG SL tablet Commonly known as:  NITROSTAT Place 0.4 mg under the tongue every 5 (five) minutes as needed for chest pain.   TRAVATAN Z 0.004 % Soln ophthalmic solution Generic drug:  Travoprost (BAK Free) Place 1 drop into both eyes at bedtime.   triamcinolone cream 0.1 % Commonly known as:  KENALOG Apply 1 application topically 2 (two) times daily as needed (for rash).   TRIPLE ANTIBIOTIC [phone] Oint Apply 1 application topically See admin instructions. To affected area(s) for skin tears or abrasions (Clean area with normal saline, apply ointment, cover with Bandaid or gauze and tape & change as needed until healed.)   Vitamin D 2000 units Caps Take 2,000 Units by mouth daily.     Relevant Imaging Results:  Relevant Lab Results:   Additional Information SSN: [place], [place]

## 2016-12-02 NOTE — Clinical Social Work Note (Signed)
5:17pm-CSW received phone call from [facility] stating [facility] refusing to take pt back because FL-2 not received and pt is currently in route to facility.  CSW called [facility] [phone] and spoke with Santiago Glad, who stated FL-2 or D/C summary not received. CSW saw that FL-2 and d/c summary sent through hub earlier this afternoon. Santiago Glad stated she does not hub not working and need information faxed. CSW faxed information to them and informed that they need to contact the [employer] helpdesk to fix this issue, as we no longer hard fax documents.   CSW called back and spoke with Caryl Pina who confirmed with Santiago Glad that D/C summary and FL-2 received.   [name], [place], [place] Work [phone]

## 2016-12-02 NOTE — Discharge Summary (Addendum)
Physician Discharge Summary  [name] BTD:[id number] DOB: 10-Jan-1923 DOA: 11/29/2016  PCP: Eulas Post, MD  Admit date: 11/29/2016 Discharge date: 12/02/2016  Admitted From:ALF Disposition:ALF  Recommendations for Outpatient Follow-up:  1. Follow up with PCP in 1-2 weeks 2. Please obtain BMP/CBC in one week  Home Health:assistant living facility discharge Equipment/Devices:none Discharge Condition:stable CODE STATUS:full Diet recommendation:carb modified heart healthy  Brief/Interim Summary: 83 year old female with history of coronary artery disease, diabetes, hypertension, nephrolithiasis, temporal arteritis in the past presented for evaluation of confusion and change in mental status. Patient has been a resident at [facility] for the past 5 days after being transferred there after treatment for a wrist fracture. Patient fell the night prior to admission and was evaluated at [facility] with a CT scan of her head and neck which were unremarkable. Patient's weakness and confusion started around the time of return to the nursing home. Became progressively more lethargic. Found to have a UTI.  Likely acute metabolic encephalopathy in the setting of UTI: Mental status seems improving. Patient was alert awake and following commands. She reported hard of hearing and undergoing outpatient follow-up. Evaluated by PT OT and Education officer, museum. Able to tolerate diet.  UTI, likely acute cystitis without hematuria: Urine culture growing citrobater which is resistant to cefazolin and penicillin is reported as allergy. She was treated with ceftriaxone in the hospital. Discharge with oral Levaquin for 3 days. Recommended probiotics and yogurt while on antibiotics.  Type 2 diabetes: Resume home medication.  Hypertension, history of temporal arteritis: Continue home medications. Continue to monitor labs, blood pressure at assisted living facility with PCP.  Mild hypokalemia: Replete her  potassium chloride. Encourage oral intake. Recommended to monitor labs in a week to monitor serum potassium level.  Patient is clinically stable. She was allowed awake and talking. I discussed with Education officer, museum. Patient is medically stable to transfer her care to outpatient.  Discharge Diagnoses:   Principal diagnosis: Acute metabolic encephalopathy likely due to urinary tract infection. Active Problems:   Essential hypertension   Hyperlipidemia   Acute encephalopathy   Diabetes mellitus    UTI (urinary tract infection)    Discharge Instructions  Discharge Instructions    Call MD for:  difficulty breathing, headache or visual disturbances    Complete by:  As directed    Call MD for:  extreme fatigue    Complete by:  As directed    Call MD for:  hives    Complete by:  As directed    Call MD for:  persistant dizziness or light-headedness    Complete by:  As directed    Call MD for:  persistant nausea and vomiting    Complete by:  As directed    Call MD for:  severe uncontrolled pain    Complete by:  As directed    Call MD for:  temperature >100.4    Complete by:  As directed    Diet - low sodium heart healthy    Complete by:  As directed    Regular;Thin liquid   Liquid Administration via: Cup;Straw Medication Administration: Whole meds with liquid Supervision: Staff to assist with self feeding;Full supervision/cueing for compensatory strategies Compensations: Minimize environmental distractions;Slow rate;Small sips/bites Postural Changes: Seated upright at 90 degrees   Discharge instructions    Complete by:  As directed    Please follow up with your PCP in one week. Please take yogurt or probiotics while on antibiotics.   Increase activity slowly  Complete by:  As directed      Allergies as of 12/02/2016      Reactions   Penicillins Hives, Other (See Comments)   Has patient had a PCN reaction causing immediate rash, facial/tongue/throat swelling, SOB or  lightheadedness with hypotension: Yes Has patient had a PCN reaction causing severe rash involving mucus membranes or skin necrosis: Unk Has patient had a PCN reaction that required hospitalization: Unk Has patient had a PCN reaction occurring within the last 10 years: Unk If all of the above answers are "NO", then may proceed with Cephalosporin use.   Sulfadiazine Hives      Medication List    TAKE these medications   acetaminophen 500 MG tablet Commonly known as:  TYLENOL Take 500 mg by mouth every 4 (four) hours as needed (for headache, doscomfort, or fever of 99.5-101 F).   atorvastatin 20 MG tablet Commonly known as:  LIPITOR TAKE 1 TABLET (20 MG TOTAL) BY MOUTH DAILY. What changed:  See the new instructions.   azelastine 0.1 % nasal spray Commonly known as:  ASTELIN USE 2 SPRAYS IN EACH NOSTRILTWICE DAILY   busPIRone 10 MG tablet Commonly known as:  BUSPAR Take 10 mg by mouth 2 (two) times daily.   guaifenesin 100 MG/5ML syrup Commonly known as:  ROBITUSSIN Take 200 mg by mouth every 6 (six) hours as needed for cough.   HYDROcodone-acetaminophen 5-325 MG tablet Commonly known as:  NORCO/VICODIN Take 1 tablet by mouth daily as needed (for pain).   levofloxacin 500 MG tablet Commonly known as:  LEVAQUIN Take 1 tablet (500 mg total) by mouth daily. For 3 days only.   loperamide 2 MG capsule Commonly known as:  IMODIUM Take 2 mg by mouth See admin instructions. With each loose stool as needed for diarrhea (max 8 doses/24 hours)   losartan 50 MG tablet Commonly known as:  COZAAR TAKE 1 TABLET BY MOUTH EVERY DAY   magnesium hydroxide 400 MG/5ML suspension Commonly known as:  MILK OF MAGNESIA Take 30 mLs by mouth at bedtime as needed for mild constipation.   metFORMIN 750 MG 24 hr tablet Commonly known as:  GLUCOPHAGE-XR TAKE 1 TABLET DAILY WITH BREAKFAST   methotrexate 2.5 MG tablet Commonly known as:  RHEUMATREX TAKE 2 TABLETS BY MOUTH ONCE A WEEK What  changed:  See the new instructions.   metoprolol succinate 25 MG 24 hr tablet Commonly known as:  TOPROL-XL TAKE 1 TABLET (25 MG TOTAL) BY MOUTH DAILY.   [place] 200-200-20 MG/5ML suspension Generic drug:  alum & mag hydroxide-simeth Take 30 mLs by mouth every 6 (six) hours as needed for indigestion or heartburn. NOT TO EXCEED 4 DOSES/24 HOURS   nitroGLYCERIN 0.4 MG SL tablet Commonly known as:  NITROSTAT Place 0.4 mg under the tongue every 5 (five) minutes as needed for chest pain.   TRAVATAN Z 0.004 % Soln ophthalmic solution Generic drug:  Travoprost (BAK Free) Place 1 drop into both eyes at bedtime.   triamcinolone cream 0.1 % Commonly known as:  KENALOG Apply 1 application topically 2 (two) times daily as needed (for rash).   TRIPLE ANTIBIOTIC [phone] Oint Apply 1 application topically See admin instructions. To affected area(s) for skin tears or abrasions (Clean area with normal saline, apply ointment, cover with Bandaid or gauze and tape & change as needed until healed.)   Vitamin D 2000 units Caps Take 2,000 Units by mouth daily.      Follow-up Information    Eulas Post, MD.  Schedule an appointment as soon as possible for a visit in 1 week(s).   Specialty:  Family Medicine Contact information: [address] [place] [address] [phone]          Allergies  Allergen Reactions  . Penicillins Hives and Other (See Comments)    Has patient had a PCN reaction causing immediate rash, facial/tongue/throat swelling, SOB or lightheadedness with hypotension: Yes Has patient had a PCN reaction causing severe rash involving mucus membranes or skin necrosis: Unk Has patient had a PCN reaction that required hospitalization: Unk Has patient had a PCN reaction occurring within the last 10 years: Unk If all of the above answers are "NO", then may proceed with Cephalosporin use.  . Sulfadiazine Hives     Consultations: None  Procedures/Studies: None  Subjective: Patient was seen and examined at bedside. She was allowed awake and talking. Denied headache, dizziness, nausea, vomiting, chest pain or shortness of breath. Reported hard of hearing.  Discharge Exam: Vitals:   12/02/16 0350 12/02/16 0711  BP: (!) 168/61 (!) 153/60  Pulse: (!) 101 93  Resp:  (!) 1  Temp:  98.7 F (37.1 C)   Vitals:   12/01/16 2249 12/02/16 0255 12/02/16 0350 12/02/16 0711  BP: (!) 182/65 (!) 180/75 (!) 168/61 (!) 153/60  Pulse: 99 (!) 101 (!) 101 93  Resp: 17   (!) 1  Temp: 100.1 F (37.8 C)   98.7 F (37.1 C)  TempSrc: Oral     SpO2: 93%   94%  Weight:      Height:        General: Pt is alert, awake, not in acute distress Cardiovascular: RRR, S1/S2 +, no rubs, no gallops Respiratory: CTA bilaterally, no wheezing, no rhonchi Abdominal: Soft, NT, ND, bowel sounds + Extremities: no edema, no cyanosis    The results of significant diagnostics from this hospitalization (including imaging, microbiology, ancillary and laboratory) are listed below for reference.     Microbiology: Recent Results (from the past 240 hour(s))  Urine culture     Status: Abnormal   Collection Time: 11/29/16 12:26 PM  Result Value Ref Range Status   Specimen Description URINE, RANDOM  Final   Special Requests NONE  Final   Culture >=100,000 COLONIES/mL CITROBACTER FREUNDII (A)  Final   Report Status 12/01/2016 FINAL  Final   Organism ID, Bacteria CITROBACTER FREUNDII (A)  Final      Susceptibility   Citrobacter freundii - MIC*    CEFAZOLIN >=64 RESISTANT Resistant     CEFTRIAXONE <=1 SENSITIVE Sensitive     CIPROFLOXACIN <=0.25 SENSITIVE Sensitive     GENTAMICIN <=1 SENSITIVE Sensitive     IMIPENEM <=0.25 SENSITIVE Sensitive     NITROFURANTOIN <=16 SENSITIVE Sensitive     TRIMETH/SULFA <=20 SENSITIVE Sensitive     PIP/TAZO <=4 SENSITIVE Sensitive     * >=100,000 COLONIES/mL CITROBACTER FREUNDII      Labs: BNP (last 3 results) No results for input(s): BNP in the last 8760 hours. Basic Metabolic Panel:  Recent Labs Lab 11/29/16 1211 11/30/16 0600 12/01/16 0436  NA 135 134* 136  K 4.0 3.6 3.3*  CL 98* 101 103  CO2 27 25 24   GLUCOSE 169* 135* 155*  BUN 10 10 12   CREATININE 0.73 0.77 0.73  CALCIUM 9.8 9.1 8.8*   Liver Function Tests:  Recent Labs Lab 11/29/16 1211  AST 30  ALT 17  ALKPHOS 58  BILITOT 1.0  PROT 6.0*  ALBUMIN 3.2*   No  results for input(s): LIPASE, AMYLASE in the last 168 hours. No results for input(s): AMMONIA in the last 168 hours. CBC:  Recent Labs Lab 11/29/16 1211 11/30/16 0600 12/01/16 0436  WBC 12.3* 11.9* 11.7*  HGB 11.6* 10.5* 9.7*  HCT 34.9* 32.0* 30.1*  MCV 94.6 95.2 95.9  PLT 159 168 179   Cardiac Enzymes: No results for input(s): CKTOTAL, CKMB, CKMBINDEX, TROPONINI in the last 168 hours. BNP: Invalid input(s): POCBNP CBG:  Recent Labs Lab 12/01/16 0812 12/01/16 1157 12/01/16 1754 12/01/16 2251 12/02/16 0821  GLUCAP 110* 144* 127* 121* 139*   D-Dimer No results for input(s): DDIMER in the last 72 hours. Hgb A1c No results for input(s): HGBA1C in the last 72 hours. Lipid Profile No results for input(s): CHOL, HDL, LDLCALC, TRIG, CHOLHDL, LDLDIRECT in the last 72 hours. Thyroid function studies No results for input(s): TSH, T4TOTAL, T3FREE, THYROIDAB in the last 72 hours.  Invalid input(s): FREET3 Anemia work up No results for input(s): VITAMINB12, FOLATE, FERRITIN, TIBC, IRON, RETICCTPCT in the last 72 hours. Urinalysis    Component Value Date/Time   COLORURINE YELLOW 11/29/2016 1226   APPEARANCEUR CLOUDY (A) 11/29/2016 1226   LABSPEC 1.019 11/29/2016 1226   PHURINE 5.0 11/29/2016 1226   GLUCOSEU 50 (A) 11/29/2016 1226   HGBUR MODERATE (A) 11/29/2016 1226   BILIRUBINUR NEGATIVE 11/29/2016 1226   BILIRUBINUR neg 12/03/2013 1135   KETONESUR 20 (A) 11/29/2016 1226   PROTEINUR 100 (A) 11/29/2016 1226    UROBILINOGEN 0.2 12/03/2013 1135   UROBILINOGEN 1.0 11/14/2013 1325   NITRITE NEGATIVE 11/29/2016 1226   LEUKOCYTESUR SMALL (A) 11/29/2016 1226   Sepsis Labs Invalid input(s): PROCALCITONIN,  WBC,  LACTICIDVEN Microbiology Recent Results (from the past 240 hour(s))  Urine culture     Status: Abnormal   Collection Time: 11/29/16 12:26 PM  Result Value Ref Range Status   Specimen Description URINE, RANDOM  Final   Special Requests NONE  Final   Culture >=100,000 COLONIES/mL CITROBACTER FREUNDII (A)  Final   Report Status 12/01/2016 FINAL  Final   Organism ID, Bacteria CITROBACTER FREUNDII (A)  Final      Susceptibility   Citrobacter freundii - MIC*    CEFAZOLIN >=64 RESISTANT Resistant     CEFTRIAXONE <=1 SENSITIVE Sensitive     CIPROFLOXACIN <=0.25 SENSITIVE Sensitive     GENTAMICIN <=1 SENSITIVE Sensitive     IMIPENEM <=0.25 SENSITIVE Sensitive     NITROFURANTOIN <=16 SENSITIVE Sensitive     TRIMETH/SULFA <=20 SENSITIVE Sensitive     PIP/TAZO <=4 SENSITIVE Sensitive     * >=100,000 COLONIES/mL CITROBACTER FREUNDII     Time coordinating discharge: 27 minutes  SIGNED:   Rosita Fire, MD  [facility] 12/02/2016, 12:00 PM  If 7PM-7AM, please contact night-coverage [email or web address] Password [email or web address]

## 2016-12-02 NOTE — Care Management Note (Signed)
Case Management Note  Patient Details  Name: [name] MRN: [id number] Date of Birth: 1923-01-23  Subjective/Objective:                 Patient admitted with acute encephalopathy.    Action/Plan:   Patient has spent the last week at [facility] and will return at discharge. She was receiving PT and OT while there.  Expected Discharge Date:                  Expected Discharge Plan:  Assisted Living / Rest Home  In-House Referral:  Clinical Social Work  Discharge planning Services  CM Consult  Post Acute Care Choice:    Choice offered to:     DME Arranged:    DME Agency:     HH Arranged:    HH Agency:     Status of Service:  Completed, signed off  If discussed at [employer], dates discussed:    Additional Comments:  Carles Collet, RN 12/02/2016, 11:19 AM

## 2016-12-02 NOTE — Progress Notes (Signed)
PT Cancellation Note  Patient Details Name: [name] MRN: [id number] DOB: 1923-04-07   Cancelled Treatment:    Reason Eval/Treat Not Completed: Other (comment) (Pt to d/c to SNF today, will defer PT to next level of care.  )   Emeril Stille Eli Hose 12/02/2016, 3:58 PM Governor Rooks, PTA pager [phone]

## 2016-12-02 NOTE — Consult Note (Signed)
           [facility] CM Primary Care Navigator  12/02/2016  CANDITA BORENSTEIN Nov 11, 1922 [id number]   Jeryl Columbia to see patient today at the bedside to identify possible discharge needs but she is presently being prepared by EMS for transport to skilled nursing facility.  Staffreports that she is going back to [facility] - where she was staying before hospitalization.  For questions, please contact:  Dannielle Huh, BSN, RN- [facility] Primary Care Navigator  Telephone: [phone] [place]

## 2016-12-02 NOTE — Progress Notes (Signed)
Patient discharged to [facility] home. Report called, received by Inocente Salles, med technician. Notified tele-monitoring; IV removed. Family already notified at bedside of transfer. Left unit via gurney, EMS personnel. No signs of distress

## 2016-12-02 NOTE — Progress Notes (Signed)
Patient will DC to: [facility] ALF Anticipated DC date: 12/02/16 Family notified: Lattie Haw and Fara Olden Transport by: Corey Harold   Per MD patient ready for DC to ALF. RN, patient, patient's family, and facility notified of DC. Discharge Summary sent to facility. RN given number for report. DC packet on chart. Ambulance transport requested for patient.   CSW signing off.  Cedric Fishman, [place] Social Worker [phone]

## 2016-12-03 DIAGNOSIS — M79641 Pain in right hand: Secondary | ICD-10-CM | POA: Diagnosis not present

## 2016-12-05 ENCOUNTER — Telehealth: Payer: Self-pay

## 2016-12-05 DIAGNOSIS — R4182 Altered mental status, unspecified: Secondary | ICD-10-CM | POA: Diagnosis not present

## 2016-12-05 DIAGNOSIS — R269 Unspecified abnormalities of gait and mobility: Secondary | ICD-10-CM | POA: Diagnosis not present

## 2016-12-05 DIAGNOSIS — R296 Repeated falls: Secondary | ICD-10-CM | POA: Diagnosis not present

## 2016-12-05 DIAGNOSIS — M6281 Muscle weakness (generalized): Secondary | ICD-10-CM | POA: Diagnosis not present

## 2016-12-05 DIAGNOSIS — G9341 Metabolic encephalopathy: Secondary | ICD-10-CM | POA: Diagnosis not present

## 2016-12-05 DIAGNOSIS — N390 Urinary tract infection, site not specified: Secondary | ICD-10-CM | POA: Diagnosis not present

## 2016-12-05 NOTE — Telephone Encounter (Signed)
Spoke with pt's caregiver, Lattie Haw, and she reports that Hospice and Palliative Care is coming to the nursing home today for assessment. She will call back tomorrow and advise if pt is accepted or not.

## 2016-12-06 DIAGNOSIS — H818X9 Other disorders of vestibular function, unspecified ear: Secondary | ICD-10-CM | POA: Diagnosis not present

## 2016-12-06 DIAGNOSIS — E0869 Diabetes mellitus due to underlying condition with other specified complication: Secondary | ICD-10-CM | POA: Diagnosis not present

## 2016-12-06 DIAGNOSIS — E785 Hyperlipidemia, unspecified: Secondary | ICD-10-CM | POA: Diagnosis not present

## 2016-12-06 DIAGNOSIS — I25119 Atherosclerotic heart disease of native coronary artery with unspecified angina pectoris: Secondary | ICD-10-CM | POA: Diagnosis not present

## 2016-12-06 DIAGNOSIS — G0430 Acute necrotizing hemorrhagic encephalopathy, unspecified: Secondary | ICD-10-CM | POA: Diagnosis not present

## 2016-12-06 DIAGNOSIS — R296 Repeated falls: Secondary | ICD-10-CM | POA: Diagnosis not present

## 2016-12-06 DIAGNOSIS — E118 Type 2 diabetes mellitus with unspecified complications: Secondary | ICD-10-CM | POA: Diagnosis not present

## 2016-12-06 DIAGNOSIS — I6980 Unspecified sequelae of other cerebrovascular disease: Secondary | ICD-10-CM | POA: Diagnosis not present

## 2016-12-06 DIAGNOSIS — N390 Urinary tract infection, site not specified: Secondary | ICD-10-CM | POA: Diagnosis not present

## 2016-12-06 DIAGNOSIS — M353 Polymyalgia rheumatica: Secondary | ICD-10-CM | POA: Diagnosis not present

## 2016-12-07 DIAGNOSIS — E785 Hyperlipidemia, unspecified: Secondary | ICD-10-CM | POA: Diagnosis not present

## 2016-12-07 DIAGNOSIS — Z79899 Other long term (current) drug therapy: Secondary | ICD-10-CM | POA: Diagnosis not present

## 2016-12-07 DIAGNOSIS — N390 Urinary tract infection, site not specified: Secondary | ICD-10-CM | POA: Diagnosis not present

## 2016-12-07 DIAGNOSIS — E0869 Diabetes mellitus due to underlying condition with other specified complication: Secondary | ICD-10-CM | POA: Diagnosis not present

## 2016-12-07 DIAGNOSIS — R296 Repeated falls: Secondary | ICD-10-CM | POA: Diagnosis not present

## 2016-12-07 DIAGNOSIS — G0430 Acute necrotizing hemorrhagic encephalopathy, unspecified: Secondary | ICD-10-CM | POA: Diagnosis not present

## 2016-12-07 DIAGNOSIS — D649 Anemia, unspecified: Secondary | ICD-10-CM | POA: Diagnosis not present

## 2016-12-07 DIAGNOSIS — I6980 Unspecified sequelae of other cerebrovascular disease: Secondary | ICD-10-CM | POA: Diagnosis not present

## 2016-12-09 DIAGNOSIS — N390 Urinary tract infection, site not specified: Secondary | ICD-10-CM | POA: Diagnosis not present

## 2016-12-09 DIAGNOSIS — I6980 Unspecified sequelae of other cerebrovascular disease: Secondary | ICD-10-CM | POA: Diagnosis not present

## 2016-12-09 DIAGNOSIS — G0430 Acute necrotizing hemorrhagic encephalopathy, unspecified: Secondary | ICD-10-CM | POA: Diagnosis not present

## 2016-12-09 DIAGNOSIS — R296 Repeated falls: Secondary | ICD-10-CM | POA: Diagnosis not present

## 2016-12-09 DIAGNOSIS — E785 Hyperlipidemia, unspecified: Secondary | ICD-10-CM | POA: Diagnosis not present

## 2016-12-09 DIAGNOSIS — E0869 Diabetes mellitus due to underlying condition with other specified complication: Secondary | ICD-10-CM | POA: Diagnosis not present

## 2016-12-09 NOTE — Telephone Encounter (Signed)
Spoke with [name] regarding pt's Palliative Care assessment. She states that pt has been accepted into Palliative Care program. Pt is eating well and interacting well with residents at new facility. She is still unable to ambulate on her own. They have no other questions or concerns and are aware that Palliative Care will contact us for any needs. Nothing further needed at this time.  Dr. Elease Hashimoto - FYI. Thanks

## 2016-12-12 DIAGNOSIS — R296 Repeated falls: Secondary | ICD-10-CM | POA: Diagnosis not present

## 2016-12-12 DIAGNOSIS — E785 Hyperlipidemia, unspecified: Secondary | ICD-10-CM | POA: Diagnosis not present

## 2016-12-12 DIAGNOSIS — I6980 Unspecified sequelae of other cerebrovascular disease: Secondary | ICD-10-CM | POA: Diagnosis not present

## 2016-12-12 DIAGNOSIS — G0430 Acute necrotizing hemorrhagic encephalopathy, unspecified: Secondary | ICD-10-CM | POA: Diagnosis not present

## 2016-12-12 DIAGNOSIS — N390 Urinary tract infection, site not specified: Secondary | ICD-10-CM | POA: Diagnosis not present

## 2016-12-12 DIAGNOSIS — E0869 Diabetes mellitus due to underlying condition with other specified complication: Secondary | ICD-10-CM | POA: Diagnosis not present

## 2016-12-13 DIAGNOSIS — H818X9 Other disorders of vestibular function, unspecified ear: Secondary | ICD-10-CM | POA: Diagnosis not present

## 2016-12-13 DIAGNOSIS — R296 Repeated falls: Secondary | ICD-10-CM | POA: Diagnosis not present

## 2016-12-13 DIAGNOSIS — E118 Type 2 diabetes mellitus with unspecified complications: Secondary | ICD-10-CM | POA: Diagnosis not present

## 2016-12-13 DIAGNOSIS — N390 Urinary tract infection, site not specified: Secondary | ICD-10-CM | POA: Diagnosis not present

## 2016-12-13 DIAGNOSIS — G0430 Acute necrotizing hemorrhagic encephalopathy, unspecified: Secondary | ICD-10-CM | POA: Diagnosis not present

## 2016-12-13 DIAGNOSIS — M353 Polymyalgia rheumatica: Secondary | ICD-10-CM | POA: Diagnosis not present

## 2016-12-13 DIAGNOSIS — I6980 Unspecified sequelae of other cerebrovascular disease: Secondary | ICD-10-CM | POA: Diagnosis not present

## 2016-12-13 DIAGNOSIS — E0869 Diabetes mellitus due to underlying condition with other specified complication: Secondary | ICD-10-CM | POA: Diagnosis not present

## 2016-12-13 DIAGNOSIS — E785 Hyperlipidemia, unspecified: Secondary | ICD-10-CM | POA: Diagnosis not present

## 2016-12-13 DIAGNOSIS — I25119 Atherosclerotic heart disease of native coronary artery with unspecified angina pectoris: Secondary | ICD-10-CM | POA: Diagnosis not present

## 2016-12-14 DIAGNOSIS — E0869 Diabetes mellitus due to underlying condition with other specified complication: Secondary | ICD-10-CM | POA: Diagnosis not present

## 2016-12-14 DIAGNOSIS — N390 Urinary tract infection, site not specified: Secondary | ICD-10-CM | POA: Diagnosis not present

## 2016-12-14 DIAGNOSIS — I6980 Unspecified sequelae of other cerebrovascular disease: Secondary | ICD-10-CM | POA: Diagnosis not present

## 2016-12-14 DIAGNOSIS — G0430 Acute necrotizing hemorrhagic encephalopathy, unspecified: Secondary | ICD-10-CM | POA: Diagnosis not present

## 2016-12-14 DIAGNOSIS — E785 Hyperlipidemia, unspecified: Secondary | ICD-10-CM | POA: Diagnosis not present

## 2016-12-14 DIAGNOSIS — R296 Repeated falls: Secondary | ICD-10-CM | POA: Diagnosis not present

## 2016-12-15 DIAGNOSIS — E0869 Diabetes mellitus due to underlying condition with other specified complication: Secondary | ICD-10-CM | POA: Diagnosis not present

## 2016-12-15 DIAGNOSIS — N390 Urinary tract infection, site not specified: Secondary | ICD-10-CM | POA: Diagnosis not present

## 2016-12-15 DIAGNOSIS — G0430 Acute necrotizing hemorrhagic encephalopathy, unspecified: Secondary | ICD-10-CM | POA: Diagnosis not present

## 2016-12-15 DIAGNOSIS — R296 Repeated falls: Secondary | ICD-10-CM | POA: Diagnosis not present

## 2016-12-15 DIAGNOSIS — I6980 Unspecified sequelae of other cerebrovascular disease: Secondary | ICD-10-CM | POA: Diagnosis not present

## 2016-12-15 DIAGNOSIS — E785 Hyperlipidemia, unspecified: Secondary | ICD-10-CM | POA: Diagnosis not present

## 2016-12-19 DIAGNOSIS — G0430 Acute necrotizing hemorrhagic encephalopathy, unspecified: Secondary | ICD-10-CM | POA: Diagnosis not present

## 2016-12-19 DIAGNOSIS — E0869 Diabetes mellitus due to underlying condition with other specified complication: Secondary | ICD-10-CM | POA: Diagnosis not present

## 2016-12-19 DIAGNOSIS — N390 Urinary tract infection, site not specified: Secondary | ICD-10-CM | POA: Diagnosis not present

## 2016-12-19 DIAGNOSIS — R296 Repeated falls: Secondary | ICD-10-CM | POA: Diagnosis not present

## 2016-12-19 DIAGNOSIS — I6980 Unspecified sequelae of other cerebrovascular disease: Secondary | ICD-10-CM | POA: Diagnosis not present

## 2016-12-19 DIAGNOSIS — E785 Hyperlipidemia, unspecified: Secondary | ICD-10-CM | POA: Diagnosis not present

## 2016-12-21 DIAGNOSIS — R296 Repeated falls: Secondary | ICD-10-CM | POA: Diagnosis not present

## 2016-12-21 DIAGNOSIS — E0869 Diabetes mellitus due to underlying condition with other specified complication: Secondary | ICD-10-CM | POA: Diagnosis not present

## 2016-12-21 DIAGNOSIS — E785 Hyperlipidemia, unspecified: Secondary | ICD-10-CM | POA: Diagnosis not present

## 2016-12-21 DIAGNOSIS — N390 Urinary tract infection, site not specified: Secondary | ICD-10-CM | POA: Diagnosis not present

## 2016-12-21 DIAGNOSIS — G0430 Acute necrotizing hemorrhagic encephalopathy, unspecified: Secondary | ICD-10-CM | POA: Diagnosis not present

## 2016-12-21 DIAGNOSIS — I6980 Unspecified sequelae of other cerebrovascular disease: Secondary | ICD-10-CM | POA: Diagnosis not present

## 2016-12-23 DIAGNOSIS — I6980 Unspecified sequelae of other cerebrovascular disease: Secondary | ICD-10-CM | POA: Diagnosis not present

## 2016-12-23 DIAGNOSIS — G0430 Acute necrotizing hemorrhagic encephalopathy, unspecified: Secondary | ICD-10-CM | POA: Diagnosis not present

## 2016-12-23 DIAGNOSIS — E0869 Diabetes mellitus due to underlying condition with other specified complication: Secondary | ICD-10-CM | POA: Diagnosis not present

## 2016-12-23 DIAGNOSIS — R296 Repeated falls: Secondary | ICD-10-CM | POA: Diagnosis not present

## 2016-12-23 DIAGNOSIS — N390 Urinary tract infection, site not specified: Secondary | ICD-10-CM | POA: Diagnosis not present

## 2016-12-23 DIAGNOSIS — E785 Hyperlipidemia, unspecified: Secondary | ICD-10-CM | POA: Diagnosis not present

## 2016-12-26 DIAGNOSIS — E0869 Diabetes mellitus due to underlying condition with other specified complication: Secondary | ICD-10-CM | POA: Diagnosis not present

## 2016-12-26 DIAGNOSIS — R296 Repeated falls: Secondary | ICD-10-CM | POA: Diagnosis not present

## 2016-12-26 DIAGNOSIS — I6980 Unspecified sequelae of other cerebrovascular disease: Secondary | ICD-10-CM | POA: Diagnosis not present

## 2016-12-26 DIAGNOSIS — E785 Hyperlipidemia, unspecified: Secondary | ICD-10-CM | POA: Diagnosis not present

## 2016-12-26 DIAGNOSIS — N390 Urinary tract infection, site not specified: Secondary | ICD-10-CM | POA: Diagnosis not present

## 2016-12-26 DIAGNOSIS — G0430 Acute necrotizing hemorrhagic encephalopathy, unspecified: Secondary | ICD-10-CM | POA: Diagnosis not present

## 2016-12-27 DIAGNOSIS — E0869 Diabetes mellitus due to underlying condition with other specified complication: Secondary | ICD-10-CM | POA: Diagnosis not present

## 2016-12-27 DIAGNOSIS — N390 Urinary tract infection, site not specified: Secondary | ICD-10-CM | POA: Diagnosis not present

## 2016-12-27 DIAGNOSIS — R296 Repeated falls: Secondary | ICD-10-CM | POA: Diagnosis not present

## 2016-12-27 DIAGNOSIS — G0430 Acute necrotizing hemorrhagic encephalopathy, unspecified: Secondary | ICD-10-CM | POA: Diagnosis not present

## 2016-12-27 DIAGNOSIS — E785 Hyperlipidemia, unspecified: Secondary | ICD-10-CM | POA: Diagnosis not present

## 2016-12-27 DIAGNOSIS — I6980 Unspecified sequelae of other cerebrovascular disease: Secondary | ICD-10-CM | POA: Diagnosis not present

## 2016-12-28 DIAGNOSIS — G0430 Acute necrotizing hemorrhagic encephalopathy, unspecified: Secondary | ICD-10-CM | POA: Diagnosis not present

## 2016-12-28 DIAGNOSIS — E0869 Diabetes mellitus due to underlying condition with other specified complication: Secondary | ICD-10-CM | POA: Diagnosis not present

## 2016-12-28 DIAGNOSIS — I6980 Unspecified sequelae of other cerebrovascular disease: Secondary | ICD-10-CM | POA: Diagnosis not present

## 2016-12-28 DIAGNOSIS — N390 Urinary tract infection, site not specified: Secondary | ICD-10-CM | POA: Diagnosis not present

## 2016-12-28 DIAGNOSIS — E785 Hyperlipidemia, unspecified: Secondary | ICD-10-CM | POA: Diagnosis not present

## 2016-12-28 DIAGNOSIS — R296 Repeated falls: Secondary | ICD-10-CM | POA: Diagnosis not present

## 2016-12-30 DIAGNOSIS — E785 Hyperlipidemia, unspecified: Secondary | ICD-10-CM | POA: Diagnosis not present

## 2016-12-30 DIAGNOSIS — N390 Urinary tract infection, site not specified: Secondary | ICD-10-CM | POA: Diagnosis not present

## 2016-12-30 DIAGNOSIS — R296 Repeated falls: Secondary | ICD-10-CM | POA: Diagnosis not present

## 2016-12-30 DIAGNOSIS — I6980 Unspecified sequelae of other cerebrovascular disease: Secondary | ICD-10-CM | POA: Diagnosis not present

## 2016-12-30 DIAGNOSIS — E0869 Diabetes mellitus due to underlying condition with other specified complication: Secondary | ICD-10-CM | POA: Diagnosis not present

## 2016-12-30 DIAGNOSIS — G0430 Acute necrotizing hemorrhagic encephalopathy, unspecified: Secondary | ICD-10-CM | POA: Diagnosis not present

## 2017-01-02 DIAGNOSIS — I6980 Unspecified sequelae of other cerebrovascular disease: Secondary | ICD-10-CM | POA: Diagnosis not present

## 2017-01-02 DIAGNOSIS — E785 Hyperlipidemia, unspecified: Secondary | ICD-10-CM | POA: Diagnosis not present

## 2017-01-02 DIAGNOSIS — N390 Urinary tract infection, site not specified: Secondary | ICD-10-CM | POA: Diagnosis not present

## 2017-01-02 DIAGNOSIS — E0869 Diabetes mellitus due to underlying condition with other specified complication: Secondary | ICD-10-CM | POA: Diagnosis not present

## 2017-01-02 DIAGNOSIS — G0430 Acute necrotizing hemorrhagic encephalopathy, unspecified: Secondary | ICD-10-CM | POA: Diagnosis not present

## 2017-01-02 DIAGNOSIS — R296 Repeated falls: Secondary | ICD-10-CM | POA: Diagnosis not present

## 2017-01-04 DIAGNOSIS — N390 Urinary tract infection, site not specified: Secondary | ICD-10-CM | POA: Diagnosis not present

## 2017-01-04 DIAGNOSIS — G0430 Acute necrotizing hemorrhagic encephalopathy, unspecified: Secondary | ICD-10-CM | POA: Diagnosis not present

## 2017-01-04 DIAGNOSIS — E0869 Diabetes mellitus due to underlying condition with other specified complication: Secondary | ICD-10-CM | POA: Diagnosis not present

## 2017-01-04 DIAGNOSIS — E785 Hyperlipidemia, unspecified: Secondary | ICD-10-CM | POA: Diagnosis not present

## 2017-01-04 DIAGNOSIS — I6980 Unspecified sequelae of other cerebrovascular disease: Secondary | ICD-10-CM | POA: Diagnosis not present

## 2017-01-04 DIAGNOSIS — R296 Repeated falls: Secondary | ICD-10-CM | POA: Diagnosis not present

## 2017-01-06 DIAGNOSIS — G0430 Acute necrotizing hemorrhagic encephalopathy, unspecified: Secondary | ICD-10-CM | POA: Diagnosis not present

## 2017-01-06 DIAGNOSIS — R296 Repeated falls: Secondary | ICD-10-CM | POA: Diagnosis not present

## 2017-01-06 DIAGNOSIS — I6980 Unspecified sequelae of other cerebrovascular disease: Secondary | ICD-10-CM | POA: Diagnosis not present

## 2017-01-06 DIAGNOSIS — N390 Urinary tract infection, site not specified: Secondary | ICD-10-CM | POA: Diagnosis not present

## 2017-01-06 DIAGNOSIS — E0869 Diabetes mellitus due to underlying condition with other specified complication: Secondary | ICD-10-CM | POA: Diagnosis not present

## 2017-01-06 DIAGNOSIS — E785 Hyperlipidemia, unspecified: Secondary | ICD-10-CM | POA: Diagnosis not present

## 2017-01-09 DIAGNOSIS — G0430 Acute necrotizing hemorrhagic encephalopathy, unspecified: Secondary | ICD-10-CM | POA: Diagnosis not present

## 2017-01-09 DIAGNOSIS — N390 Urinary tract infection, site not specified: Secondary | ICD-10-CM | POA: Diagnosis not present

## 2017-01-09 DIAGNOSIS — E0869 Diabetes mellitus due to underlying condition with other specified complication: Secondary | ICD-10-CM | POA: Diagnosis not present

## 2017-01-09 DIAGNOSIS — R296 Repeated falls: Secondary | ICD-10-CM | POA: Diagnosis not present

## 2017-01-09 DIAGNOSIS — E785 Hyperlipidemia, unspecified: Secondary | ICD-10-CM | POA: Diagnosis not present

## 2017-01-09 DIAGNOSIS — I6980 Unspecified sequelae of other cerebrovascular disease: Secondary | ICD-10-CM | POA: Diagnosis not present

## 2017-01-11 DIAGNOSIS — E0869 Diabetes mellitus due to underlying condition with other specified complication: Secondary | ICD-10-CM | POA: Diagnosis not present

## 2017-01-11 DIAGNOSIS — R296 Repeated falls: Secondary | ICD-10-CM | POA: Diagnosis not present

## 2017-01-11 DIAGNOSIS — G0430 Acute necrotizing hemorrhagic encephalopathy, unspecified: Secondary | ICD-10-CM | POA: Diagnosis not present

## 2017-01-11 DIAGNOSIS — E785 Hyperlipidemia, unspecified: Secondary | ICD-10-CM | POA: Diagnosis not present

## 2017-01-11 DIAGNOSIS — N390 Urinary tract infection, site not specified: Secondary | ICD-10-CM | POA: Diagnosis not present

## 2017-01-11 DIAGNOSIS — I6980 Unspecified sequelae of other cerebrovascular disease: Secondary | ICD-10-CM | POA: Diagnosis not present

## 2017-01-13 DIAGNOSIS — G0430 Acute necrotizing hemorrhagic encephalopathy, unspecified: Secondary | ICD-10-CM | POA: Diagnosis not present

## 2017-01-13 DIAGNOSIS — I6980 Unspecified sequelae of other cerebrovascular disease: Secondary | ICD-10-CM | POA: Diagnosis not present

## 2017-01-13 DIAGNOSIS — H818X9 Other disorders of vestibular function, unspecified ear: Secondary | ICD-10-CM | POA: Diagnosis not present

## 2017-01-13 DIAGNOSIS — S52502D Unspecified fracture of the lower end of left radius, subsequent encounter for closed fracture with routine healing: Secondary | ICD-10-CM | POA: Diagnosis not present

## 2017-01-13 DIAGNOSIS — R296 Repeated falls: Secondary | ICD-10-CM | POA: Diagnosis not present

## 2017-01-13 DIAGNOSIS — I25119 Atherosclerotic heart disease of native coronary artery with unspecified angina pectoris: Secondary | ICD-10-CM | POA: Diagnosis not present

## 2017-01-13 DIAGNOSIS — N390 Urinary tract infection, site not specified: Secondary | ICD-10-CM | POA: Diagnosis not present

## 2017-01-13 DIAGNOSIS — E0869 Diabetes mellitus due to underlying condition with other specified complication: Secondary | ICD-10-CM | POA: Diagnosis not present

## 2017-01-13 DIAGNOSIS — M353 Polymyalgia rheumatica: Secondary | ICD-10-CM | POA: Diagnosis not present

## 2017-01-13 DIAGNOSIS — E785 Hyperlipidemia, unspecified: Secondary | ICD-10-CM | POA: Diagnosis not present

## 2017-01-13 DIAGNOSIS — E118 Type 2 diabetes mellitus with unspecified complications: Secondary | ICD-10-CM | POA: Diagnosis not present

## 2017-01-16 DIAGNOSIS — N390 Urinary tract infection, site not specified: Secondary | ICD-10-CM | POA: Diagnosis not present

## 2017-01-16 DIAGNOSIS — E785 Hyperlipidemia, unspecified: Secondary | ICD-10-CM | POA: Diagnosis not present

## 2017-01-16 DIAGNOSIS — I6980 Unspecified sequelae of other cerebrovascular disease: Secondary | ICD-10-CM | POA: Diagnosis not present

## 2017-01-16 DIAGNOSIS — R296 Repeated falls: Secondary | ICD-10-CM | POA: Diagnosis not present

## 2017-01-16 DIAGNOSIS — E0869 Diabetes mellitus due to underlying condition with other specified complication: Secondary | ICD-10-CM | POA: Diagnosis not present

## 2017-01-16 DIAGNOSIS — G0430 Acute necrotizing hemorrhagic encephalopathy, unspecified: Secondary | ICD-10-CM | POA: Diagnosis not present

## 2017-01-17 DIAGNOSIS — M316 Other giant cell arteritis: Secondary | ICD-10-CM | POA: Diagnosis not present

## 2017-01-17 DIAGNOSIS — G9341 Metabolic encephalopathy: Secondary | ICD-10-CM | POA: Diagnosis not present

## 2017-01-17 DIAGNOSIS — R296 Repeated falls: Secondary | ICD-10-CM | POA: Diagnosis not present

## 2017-01-17 DIAGNOSIS — H9193 Unspecified hearing loss, bilateral: Secondary | ICD-10-CM | POA: Diagnosis not present

## 2017-01-17 DIAGNOSIS — M6281 Muscle weakness (generalized): Secondary | ICD-10-CM | POA: Diagnosis not present

## 2017-01-17 DIAGNOSIS — R269 Unspecified abnormalities of gait and mobility: Secondary | ICD-10-CM | POA: Diagnosis not present

## 2017-01-18 DIAGNOSIS — G0430 Acute necrotizing hemorrhagic encephalopathy, unspecified: Secondary | ICD-10-CM | POA: Diagnosis not present

## 2017-01-18 DIAGNOSIS — E0869 Diabetes mellitus due to underlying condition with other specified complication: Secondary | ICD-10-CM | POA: Diagnosis not present

## 2017-01-18 DIAGNOSIS — R296 Repeated falls: Secondary | ICD-10-CM | POA: Diagnosis not present

## 2017-01-18 DIAGNOSIS — E785 Hyperlipidemia, unspecified: Secondary | ICD-10-CM | POA: Diagnosis not present

## 2017-01-18 DIAGNOSIS — I6980 Unspecified sequelae of other cerebrovascular disease: Secondary | ICD-10-CM | POA: Diagnosis not present

## 2017-01-18 DIAGNOSIS — N390 Urinary tract infection, site not specified: Secondary | ICD-10-CM | POA: Diagnosis not present

## 2017-01-19 ENCOUNTER — Emergency Department (HOSPITAL_COMMUNITY): Payer: Medicare Other

## 2017-01-19 ENCOUNTER — Observation Stay (HOSPITAL_COMMUNITY)
Admission: EM | Admit: 2017-01-19 | Discharge: 2017-01-21 | Disposition: A | Discharge status: Skilled Nursing Facility | Payer: Medicare Other | Attending: Internal Medicine | Admitting: Internal Medicine

## 2017-01-19 ENCOUNTER — Encounter (HOSPITAL_COMMUNITY): Payer: Self-pay | Admitting: Nurse Practitioner

## 2017-01-19 DIAGNOSIS — M353 Polymyalgia rheumatica: Secondary | ICD-10-CM

## 2017-01-19 DIAGNOSIS — R451 Restlessness and agitation: Secondary | ICD-10-CM

## 2017-01-19 DIAGNOSIS — E1151 Type 2 diabetes mellitus with diabetic peripheral angiopathy without gangrene: Secondary | ICD-10-CM | POA: Diagnosis not present

## 2017-01-19 DIAGNOSIS — F329 Major depressive disorder, single episode, unspecified: Secondary | ICD-10-CM | POA: Diagnosis not present

## 2017-01-19 DIAGNOSIS — I252 Old myocardial infarction: Secondary | ICD-10-CM | POA: Diagnosis not present

## 2017-01-19 DIAGNOSIS — I700 Atherosclerosis of aorta: Secondary | ICD-10-CM | POA: Diagnosis not present

## 2017-01-19 DIAGNOSIS — I10 Essential (primary) hypertension: Secondary | ICD-10-CM | POA: Diagnosis not present

## 2017-01-19 DIAGNOSIS — G9340 Encephalopathy, unspecified: Secondary | ICD-10-CM | POA: Diagnosis not present

## 2017-01-19 DIAGNOSIS — Z882 Allergy status to sulfonamides status: Secondary | ICD-10-CM | POA: Insufficient documentation

## 2017-01-19 DIAGNOSIS — I119 Hypertensive heart disease without heart failure: Secondary | ICD-10-CM | POA: Diagnosis not present

## 2017-01-19 DIAGNOSIS — H8110 Benign paroxysmal vertigo, unspecified ear: Secondary | ICD-10-CM | POA: Insufficient documentation

## 2017-01-19 DIAGNOSIS — Z880 Allergy status to penicillin: Secondary | ICD-10-CM | POA: Insufficient documentation

## 2017-01-19 DIAGNOSIS — J309 Allergic rhinitis, unspecified: Secondary | ICD-10-CM | POA: Diagnosis not present

## 2017-01-19 DIAGNOSIS — D649 Anemia, unspecified: Secondary | ICD-10-CM | POA: Diagnosis not present

## 2017-01-19 DIAGNOSIS — F22 Delusional disorders: Secondary | ICD-10-CM | POA: Diagnosis not present

## 2017-01-19 DIAGNOSIS — S0990XA Unspecified injury of head, initial encounter: Secondary | ICD-10-CM | POA: Diagnosis not present

## 2017-01-19 DIAGNOSIS — R4182 Altered mental status, unspecified: Secondary | ICD-10-CM | POA: Diagnosis not present

## 2017-01-19 DIAGNOSIS — M315 Giant cell arteritis with polymyalgia rheumatica: Secondary | ICD-10-CM | POA: Diagnosis not present

## 2017-01-19 DIAGNOSIS — Z9841 Cataract extraction status, right eye: Secondary | ICD-10-CM | POA: Diagnosis not present

## 2017-01-19 DIAGNOSIS — Z993 Dependence on wheelchair: Secondary | ICD-10-CM | POA: Insufficient documentation

## 2017-01-19 DIAGNOSIS — Z90710 Acquired absence of both cervix and uterus: Secondary | ICD-10-CM | POA: Insufficient documentation

## 2017-01-19 DIAGNOSIS — Z9842 Cataract extraction status, left eye: Secondary | ICD-10-CM | POA: Diagnosis not present

## 2017-01-19 DIAGNOSIS — Z87442 Personal history of urinary calculi: Secondary | ICD-10-CM | POA: Diagnosis not present

## 2017-01-19 DIAGNOSIS — I2510 Atherosclerotic heart disease of native coronary artery without angina pectoris: Secondary | ICD-10-CM | POA: Diagnosis present

## 2017-01-19 DIAGNOSIS — R410 Disorientation, unspecified: Secondary | ICD-10-CM | POA: Insufficient documentation

## 2017-01-19 DIAGNOSIS — D320 Benign neoplasm of cerebral meninges: Secondary | ICD-10-CM | POA: Diagnosis not present

## 2017-01-19 DIAGNOSIS — N3000 Acute cystitis without hematuria: Secondary | ICD-10-CM | POA: Diagnosis not present

## 2017-01-19 DIAGNOSIS — Z955 Presence of coronary angioplasty implant and graft: Secondary | ICD-10-CM | POA: Insufficient documentation

## 2017-01-19 DIAGNOSIS — Z794 Long term (current) use of insulin: Secondary | ICD-10-CM | POA: Insufficient documentation

## 2017-01-19 DIAGNOSIS — M722 Plantar fascial fibromatosis: Secondary | ICD-10-CM | POA: Diagnosis not present

## 2017-01-19 DIAGNOSIS — G9341 Metabolic encephalopathy: Secondary | ICD-10-CM | POA: Diagnosis not present

## 2017-01-19 DIAGNOSIS — R2681 Unsteadiness on feet: Secondary | ICD-10-CM | POA: Diagnosis not present

## 2017-01-19 DIAGNOSIS — Z8249 Family history of ischemic heart disease and other diseases of the circulatory system: Secondary | ICD-10-CM | POA: Insufficient documentation

## 2017-01-19 DIAGNOSIS — Z79899 Other long term (current) drug therapy: Secondary | ICD-10-CM | POA: Insufficient documentation

## 2017-01-19 DIAGNOSIS — G5600 Carpal tunnel syndrome, unspecified upper limb: Secondary | ICD-10-CM | POA: Insufficient documentation

## 2017-01-19 DIAGNOSIS — E1165 Type 2 diabetes mellitus with hyperglycemia: Secondary | ICD-10-CM | POA: Diagnosis present

## 2017-01-19 DIAGNOSIS — Z823 Family history of stroke: Secondary | ICD-10-CM | POA: Insufficient documentation

## 2017-01-19 LAB — CBC
HCT: 31.7 % — ABNORMAL LOW (ref 36.0–46.0)
HEMOGLOBIN: 10.5 g/dL — AB (ref 12.0–15.0)
MCH: 30.8 pg (ref 26.0–34.0)
MCHC: 33.1 g/dL (ref 30.0–36.0)
MCV: 93 fL (ref 78.0–100.0)
Platelets: 236 10*3/uL (ref 150–400)
RBC: 3.41 MIL/uL — AB (ref 3.87–5.11)
RDW: 14.2 % (ref 11.5–15.5)
WBC: 10.5 10*3/uL (ref 4.0–10.5)

## 2017-01-19 LAB — BLOOD GAS, VENOUS
BICARBONATE: 24.8 mmol/L (ref 20.0–28.0)
O2 SAT: 96.9 %
PCO2 VEN: 38.7 mmHg — AB (ref 44.0–60.0)
PH VEN: 7.423 (ref 7.250–7.430)
Patient temperature: 98.6

## 2017-01-19 LAB — COMPREHENSIVE METABOLIC PANEL
ALBUMIN: 2.9 g/dL — AB (ref 3.5–5.0)
ALT: 13 U/L — ABNORMAL LOW (ref 14–54)
ANION GAP: 10 (ref 5–15)
AST: 24 U/L (ref 15–41)
Alkaline Phosphatase: 91 U/L (ref 38–126)
BILIRUBIN TOTAL: 0.2 mg/dL — AB (ref 0.3–1.2)
BUN: 21 mg/dL — ABNORMAL HIGH (ref 6–20)
CALCIUM: 9.1 mg/dL (ref 8.9–10.3)
CO2: 25 mmol/L (ref 22–32)
Chloride: 101 mmol/L (ref 101–111)
Creatinine, Ser: 0.85 mg/dL (ref 0.44–1.00)
GFR, EST NON AFRICAN AMERICAN: 58 mL/min — AB (ref >60)
Glucose, Bld: 303 mg/dL — ABNORMAL HIGH (ref 65–99)
POTASSIUM: 4 mmol/L (ref 3.5–5.1)
Sodium: 136 mmol/L (ref 135–145)
TOTAL PROTEIN: 6.3 g/dL — AB (ref 6.5–8.1)

## 2017-01-19 LAB — URINALYSIS, ROUTINE W REFLEX MICROSCOPIC
BACTERIA UA: NONE SEEN
Bilirubin Urine: NEGATIVE
HGB URINE DIPSTICK: NEGATIVE
KETONES UR: NEGATIVE mg/dL
LEUKOCYTES UA: NEGATIVE
NITRITE: NEGATIVE
PH: 5 (ref 5.0–8.0)
PROTEIN: 30 mg/dL — AB
Specific Gravity, Urine: 1.027 (ref 1.005–1.030)

## 2017-01-19 LAB — CBC WITH DIFFERENTIAL/PLATELET
BASOS ABS: 0 10*3/uL (ref 0.0–0.1)
BASOS PCT: 0 %
EOS ABS: 0 10*3/uL (ref 0.0–0.7)
Eosinophils Relative: 0 %
HEMATOCRIT: 12.2 % — AB (ref 36.0–46.0)
HEMOGLOBIN: 4.1 g/dL — AB (ref 12.0–15.0)
Lymphocytes Relative: 16 %
Lymphs Abs: 2.1 10*3/uL (ref 0.7–4.0)
MCH: 31.3 pg (ref 26.0–34.0)
MCHC: 33.6 g/dL (ref 30.0–36.0)
MCV: 93.1 fL (ref 78.0–100.0)
Monocytes Absolute: 0.4 10*3/uL (ref 0.1–1.0)
Monocytes Relative: 3 %
NEUTROS ABS: 10.7 10*3/uL — AB (ref 1.7–7.7)
NEUTROS PCT: 81 %
Platelets: 283 10*3/uL (ref 150–400)
RBC: 1.31 MIL/uL — ABNORMAL LOW (ref 3.87–5.11)
RDW: 14 % (ref 11.5–15.5)
WBC: 13.2 10*3/uL — AB (ref 4.0–10.5)

## 2017-01-19 LAB — TYPE AND SCREEN
ABO/RH(D): O POS
Antibody Screen: NEGATIVE

## 2017-01-19 LAB — PROTIME-INR
INR: 0.97
PROTHROMBIN TIME: 12.9 s (ref 11.4–15.2)

## 2017-01-19 LAB — AMMONIA: Ammonia: <9 umol/L — ABNORMAL LOW (ref 9–35)

## 2017-01-19 LAB — GLUCOSE, CAPILLARY: GLUCOSE-CAPILLARY: 166 mg/dL — AB (ref 65–99)

## 2017-01-19 LAB — TROPONIN I

## 2017-01-19 LAB — CBG MONITORING, ED: Glucose-Capillary: 376 mg/dL — ABNORMAL HIGH (ref 65–99)

## 2017-01-19 LAB — TSH: TSH: 1.41 u[IU]/mL (ref 0.350–4.500)

## 2017-01-19 LAB — ABO/RH: ABO/RH(D): O POS

## 2017-01-19 MED ORDER — SODIUM CHLORIDE 0.9 % IV BOLUS (SEPSIS)
500.0000 mL | Freq: Once | INTRAVENOUS | Status: AC
  Administered 2017-01-19: 500 mL via INTRAVENOUS

## 2017-01-19 MED ORDER — ONDANSETRON HCL 4 MG PO TABS: 4.0000 mg | ORAL_TABLET | Freq: Four times a day (QID) | ORAL | Status: DC | PRN

## 2017-01-19 MED ORDER — ONDANSETRON HCL 4 MG/2ML IJ SOLN: 4.0000 mg | Freq: Four times a day (QID) | INTRAMUSCULAR | Status: DC | PRN

## 2017-01-19 MED ORDER — SODIUM CHLORIDE 0.9 % IV SOLN
Freq: Once | INTRAVENOUS | Status: AC
  Administered 2017-01-19: 20:00:00 via INTRAVENOUS

## 2017-01-19 MED ORDER — ACETAMINOPHEN 325 MG PO TABS: 650.0000 mg | ORAL_TABLET | Freq: Four times a day (QID) | ORAL | Status: DC | PRN

## 2017-01-19 MED ORDER — AZELASTINE HCL 0.1 % NA SOLN
1.0000 | Freq: Two times a day (BID) | NASAL | Status: DC
  Administered 2017-01-20 – 2017-01-21 (×2): 1 via NASAL
  Filled 2017-01-19: qty 30

## 2017-01-19 MED ORDER — BUSPIRONE HCL 10 MG PO TABS
10.0000 mg | ORAL_TABLET | Freq: Two times a day (BID) | ORAL | Status: DC
  Administered 2017-01-20 – 2017-01-21 (×2): 10 mg via ORAL
  Filled 2017-01-19 (×2): qty 1

## 2017-01-19 MED ORDER — ACETAMINOPHEN 650 MG RE SUPP: 650.0000 mg | Freq: Four times a day (QID) | RECTAL | Status: DC | PRN

## 2017-01-19 MED ORDER — ENOXAPARIN SODIUM 40 MG/0.4ML ~~LOC~~ SOLN
40.0000 mg | Freq: Every day | SUBCUTANEOUS | Status: DC
Start: 2017-01-19 — End: 2017-01-21
  Administered 2017-01-20: 40 mg via SUBCUTANEOUS
  Filled 2017-01-19: qty 0.4

## 2017-01-19 MED ORDER — GUAIFENESIN 100 MG/5ML PO SOLN: 200.0000 mg | Freq: Four times a day (QID) | ORAL | Status: DC | PRN

## 2017-01-19 MED ORDER — LOSARTAN POTASSIUM 50 MG PO TABS
50.0000 mg | ORAL_TABLET | Freq: Every day | ORAL | Status: DC
  Administered 2017-01-21: 50 mg via ORAL
  Filled 2017-01-19: qty 1

## 2017-01-19 MED ORDER — LATANOPROST 0.005 % OP SOLN
1.0000 [drp] | Freq: Every day | OPHTHALMIC | Status: DC
  Administered 2017-01-20: 1 [drp] via OPHTHALMIC
  Filled 2017-01-19: qty 2.5

## 2017-01-19 MED ORDER — ATORVASTATIN CALCIUM 10 MG PO TABS
20.0000 mg | ORAL_TABLET | Freq: Every day | ORAL | Status: DC
  Administered 2017-01-20: 20 mg via ORAL
  Filled 2017-01-19: qty 2

## 2017-01-19 MED ORDER — INSULIN ASPART 100 UNIT/ML ~~LOC~~ SOLN
0.0000 [IU] | Freq: Three times a day (TID) | SUBCUTANEOUS | Status: DC
  Administered 2017-01-20 – 2017-01-21 (×3): 2 [IU] via SUBCUTANEOUS

## 2017-01-19 MED ORDER — SODIUM CHLORIDE 0.9 % IV SOLN
INTRAVENOUS | Status: DC
  Administered 2017-01-19: 23:00:00 via INTRAVENOUS

## 2017-01-19 MED ORDER — MAGNESIUM HYDROXIDE 400 MG/5ML PO SUSP: 30.0000 mL | Freq: Every evening | ORAL | Status: DC | PRN

## 2017-01-19 MED ORDER — VITAMIN D 1000 UNITS PO TABS
2000.0000 [IU] | ORAL_TABLET | Freq: Every day | ORAL | Status: DC
Start: 2017-01-20 — End: 2017-01-21
  Administered 2017-01-21: 2000 [IU] via ORAL
  Filled 2017-01-19: qty 2

## 2017-01-19 MED ORDER — LORAZEPAM 2 MG/ML IJ SOLN
1.0000 mg | Freq: Once | INTRAMUSCULAR | Status: AC
  Administered 2017-01-19: 1 mg via INTRAVENOUS
  Filled 2017-01-19: qty 1

## 2017-01-19 MED ORDER — ALUM & MAG HYDROXIDE-SIMETH 200-200-20 MG/5ML PO SUSP
30.0000 mL | Freq: Four times a day (QID) | ORAL | Status: DC | PRN
Start: 2017-01-19 — End: 2017-01-21

## 2017-01-19 MED ORDER — BISACODYL 5 MG PO TBEC
5.0000 mg | DELAYED_RELEASE_TABLET | Freq: Every day | ORAL | Status: DC | PRN
  Administered 2017-01-21: 5 mg via ORAL
  Filled 2017-01-19: qty 1

## 2017-01-19 MED ORDER — METOPROLOL SUCCINATE ER 25 MG PO TB24
25.0000 mg | ORAL_TABLET | Freq: Every day | ORAL | Status: DC
  Administered 2017-01-21: 25 mg via ORAL
  Filled 2017-01-19: qty 1

## 2017-01-19 MED ORDER — INSULIN ASPART 100 UNIT/ML ~~LOC~~ SOLN: 0.0000 [IU] | Freq: Every day | SUBCUTANEOUS | Status: DC

## 2017-01-19 MED ORDER — HALOPERIDOL LACTATE 5 MG/ML IJ SOLN: 5.0000 mg | Freq: Four times a day (QID) | INTRAMUSCULAR | Status: DC | PRN

## 2017-01-19 NOTE — ED Notes (Signed)
Bed: EI35 Expected date:  Expected time:  Means of arrival:  Comments: Elderly combative
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
## 2017-01-19 NOTE — H&P (Signed)
History and Physical    [name] GDJ:[id number] DOB: 17-Feb-1923 DOA: 01/19/2017  PCP: Eulas Post, MD   Patient coming from: SNF  Chief Complaint: Confused and combative   HPI: [name] is a 83 y.o. female with medical history significant for coronary artery disease with stent, type 2 diabetes mellitus, hypertension, and polymyalgia rheumatica, now presenting from her SNF for evaluation of confusion and combativeness. Patient has reportedly had some very mild confusion and memory issues and a been noted over the past few months, but has not been agitated or combative until today, when at the SNF, patient began to swing at staff, obviously confused, and posing a danger to herself and others. Despite attempts at reassurance and reorientation, patient continued to try to strike the staff members and get out of bed. Prior to this, she had not been voicing any specific complaints, has not had a fever, no cough, and no vomiting or diarrhea. She has had somewhat similar symptoms once before per her family when she had a UTI. EMS was called to the nursing facility and the patient was treated with 2 doses of 2.5 mg IV Haldol and 500 mL normal saline prior to transport to the hospital.     ED Course: Upon arrival to the ED, patient is found to be afebrile, saturating well on room air, and with vital signs stable. EKG features a sinus rhythm and chest x-ray is notable for a possible tiny left pleural effusion and stable cardiomegaly. Noncontrast head CT is negative for acute intracranial abnormality, but notable for a stable 2.1 cm right frontal meningioma without mass effect or edema. Chemistry panels notable for a serum glucose of 303 and elevated BUN to creatinine ratio. CBC initially featured a very low hemoglobin and a mild leukocytosis, but it was felt to be in error and was repeated. Repeat CBC demonstrates a stable normocytic anemia. INR is within the normal limits and troponin is  undetectable. Patient was given 500 mL normal saline in the ED. She remained very confused and combative, requiring physical restraints. She was hemodynamically stable and in no apparent respiratory distress. She'll be observed on medical-surgical unit for ongoing evaluation and management of acute encephalopathy with combativeness.   Review of Systems:  All other systems reviewed and apart from HPI, are negative.  Past Medical History:  Diagnosis Date  . BPPV (benign paroxysmal positional vertigo)   . CAD (coronary artery disease)    a. Inferior STEMI s/p BMSx2 to RCA. Coronary anomaly with single coronary ostium arising from right sinus of Valsalva and likely LM course between PA and aorta.  . Carpal tunnel syndrome 08/04/2009  . Diabetes mellitus ([place])   . Essential hypertension   . [place], [place] 03/26/2010  . INCONTINENCE, STRESS, FEMALE 10/12/2006  . Inferior MI ([place]) 04/21/2011  . 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 H/o Left eye hemianopsia (longstanding).  . Kidney stone   . Multinodular goiter   . Osteoporosis, unspecified 10/12/2006  . RHINITIS, ALLERGIC 10/12/2006  . Single kidney    a. Absence of right kidney by prior imaging.  . TEMPORAL ARTERITIS 10/12/2006  . UNSTEADY GAIT 01/22/2007    Past Surgical History:  Procedure Laterality Date  . ABDOMINAL HYSTERECTOMY    . CATARACT EXTRACTION, BILATERAL    . CORONARY ANGIOPLASTY       reports that she has never smoked. She has never used smokeless tobacco. She reports that she does not drink alcohol or  use drugs.  Allergies  Allergen Reactions  . Penicillins Hives and Other (See Comments)    Has patient had a PCN reaction causing immediate rash, facial/tongue/throat swelling, SOB or lightheadedness with hypotension: Yes Has patient had a PCN reaction causing severe rash involving mucus membranes or skin necrosis: Unk Has patient had a PCN reaction that required hospitalization: Unk Has patient had a PCN reaction  occurring within the last 10 years: Unk If all of the above answers are "NO", then may proceed with Cephalosporin use.  . Sulfadiazine Hives    Family History  Problem Relation Age of Onset  . Heart attack Father   . Stroke Mother      Prior to Admission medications   Medication Sig Start Date End Date Taking? Authorizing Provider  acetaminophen (TYLENOL) 500 MG tablet Take 500 mg by mouth every 4 (four) hours as needed (for headache, doscomfort, or fever of 99.5-101 F).    Yes [provider]  alum & mag hydroxide-simeth ([place]) 200-200-20 MG/5ML suspension Take 30 mLs by mouth every 6 (six) hours as needed for indigestion or heartburn. NOT TO EXCEED 4 DOSES/24 HOURS   Yes [provider]  atorvastatin (LIPITOR) 20 MG tablet TAKE 1 TABLET (20 MG TOTAL) BY MOUTH DAILY. Patient taking differently: Take 20 mg by mouth at bedtime 11/18/16  Yes Burchette, Alinda Sierras, MD  azelastine (ASTELIN) 0.1 % nasal spray USE 2 SPRAYS IN EACH NOSTRILTWICE DAILY 10/03/16  Yes Burchette, Alinda Sierras, MD  busPIRone (BUSPAR) 10 MG tablet Take 10 mg by mouth 2 (two) times daily.   Yes [provider]  Cholecalciferol (VITAMIN D) 2000 UNITS CAPS Take 2,000 Units by mouth daily.   Yes [provider]  guaifenesin (ROBITUSSIN) 100 MG/5ML syrup Take 200 mg by mouth every 6 (six) hours as needed for cough.    Yes [provider]  HYDROcodone-acetaminophen (NORCO/VICODIN) 5-325 MG tablet Take 1 tablet by mouth daily as needed (for pain).    Yes [provider]  loperamide (IMODIUM) 2 MG capsule Take 2 mg by mouth See admin instructions. With each loose stool as needed for diarrhea (max 8 doses/24 hours)   Yes [provider]  losartan (COZAAR) 50 MG tablet TAKE 1 TABLET BY MOUTH EVERY DAY 11/07/16  Yes Burchette, Alinda Sierras, MD  magnesium hydroxide (MILK OF MAGNESIA) 400 MG/5ML suspension Take 30 mLs by mouth at bedtime as needed for mild constipation.   Yes [provider]  metFORMIN (GLUCOPHAGE-XR) 750 MG 24 hr tablet TAKE 1 TABLET DAILY WITH BREAKFAST 09/02/16  Yes Burchette, Alinda Sierras, MD  methotrexate (RHEUMATREX) 2.5 MG tablet TAKE 2 TABLETS BY MOUTH ONCE A WEEK Patient taking differently: Take 5 mg by mouth every Monday 09/02/16  Yes Burchette, Alinda Sierras, MD  metoprolol succinate (TOPROL-XL) 25 MG 24 hr tablet TAKE 1 TABLET (25 MG TOTAL) BY MOUTH DAILY. 07/04/16  Yes Burchette, Alinda Sierras, MD  Neomycin-Bacitracin-Polymyxin (TRIPLE ANTIBIOTIC) [phone] OINT Apply 1 application topically See admin instructions. To affected area(s) for skin tears or abrasions (Clean area with normal saline, apply ointment, cover with Bandaid or gauze and tape & change as needed until healed.)   Yes [provider]  nitroGLYCERIN (NITROSTAT) 0.4 MG SL tablet Place 0.4 mg under the tongue every 5 (five) minutes as needed for chest pain.    Yes [provider]  TRAVATAN Z 0.004 % SOLN ophthalmic solution Place 1 drop into both eyes at bedtime.    Yes [provider]  triamcinolone cream (KENALOG) 0.1 % Apply 1 application topically 2 (two) times daily as needed (for rash).   Yes [provider]    Physical Exam: Vitals:   01/19/17 1717 01/19/17 1801 01/19/17 2035  BP: 119/61  138/70  Pulse: 90  93  Resp: 14  18  Temp:  98 F (36.7 C)   TempSrc:  Oral   SpO2: 97%  96%      Constitutional: NAD, calm, agitated, pulling at lines, trying to get out of bed Eyes: PERTLA, lids and conjunctivae normal ENMT: Mucous membranes are moist. Posterior pharynx clear of any exudate or lesions.   Neck: normal, supple, no masses, no thyromegaly Respiratory: clear to auscultation bilaterally, no wheezing, no crackles. Normal respiratory effort.   Cardiovascular: S1 & S2 heard, regular rate and rhythm. No extremity edema. No significant JVD. Abdomen: No distension, no tenderness, no masses palpated. Bowel sounds normal.  Musculoskeletal: no  clubbing / cyanosis. No joint deformity upper and lower extremities.  Skin: no significant rashes, lesions, ulcers. Warm, dry, well-perfused. Neurologic: No gross facial asymmetry. Moving all extremities equally. No dysarthria.   Psychiatric: Agitated. Oriented to person only.       Labs on Admission: I have personally reviewed following labs and imaging studies  CBC:  Recent Labs Lab 01/19/17 1800 01/19/17 2023  WBC 13.2* 10.5  NEUTROABS 10.7*  --   HGB 4.1* 10.5*  HCT 12.2* 31.7*  MCV 93.1 93.0  PLT 283 161   Basic Metabolic Panel:  Recent Labs Lab 01/19/17 1800  NA 136  K 4.0  CL 101  CO2 25  GLUCOSE 303*  BUN 21*  CREATININE 0.85  CALCIUM 9.1   GFR: CrCl cannot be calculated (Unknown ideal weight.). Liver Function Tests:  Recent Labs Lab 01/19/17 1800  AST 24  ALT 13*  ALKPHOS 91  BILITOT 0.2*  PROT 6.3*  ALBUMIN 2.9*   No results for input(s): LIPASE, AMYLASE in the last 168 hours. No results for input(s): AMMONIA in the last 168 hours. Coagulation Profile:  Recent Labs Lab 01/19/17 2023  INR 0.97   Cardiac Enzymes:  Recent Labs Lab 01/19/17 1800  TROPONINI <0.03   BNP (last 3 results) No results for input(s): PROBNP in the last 8760 hours. HbA1C: No results for input(s): HGBA1C in the last 72 hours. CBG:  Recent Labs Lab 01/19/17 1635  GLUCAP 376*   Lipid Profile: No results for input(s): CHOL, HDL, LDLCALC, TRIG, CHOLHDL, LDLDIRECT in the last 72 hours. Thyroid Function Tests: No results for input(s): TSH, T4TOTAL, FREET4, T3FREE, THYROIDAB in the last 72 hours. Anemia Panel: No results for input(s): VITAMINB12, FOLATE, FERRITIN, TIBC, IRON, RETICCTPCT in the last 72 hours. Urine analysis:    Component Value Date/Time   COLORURINE YELLOW 01/19/2017 1723   APPEARANCEUR HAZY (A) 01/19/2017 1723   LABSPEC 1.027 01/19/2017 1723   PHURINE 5.0 01/19/2017 1723   GLUCOSEU >=500 (A) 01/19/2017 1723   HGBUR NEGATIVE 01/19/2017  1723   BILIRUBINUR NEGATIVE 01/19/2017 1723   BILIRUBINUR neg 12/03/2013 1135   KETONESUR NEGATIVE 01/19/2017 1723   PROTEINUR 30 (A) 01/19/2017 1723   UROBILINOGEN 0.2 12/03/2013 1135   UROBILINOGEN 1.0 11/14/2013 1325   NITRITE NEGATIVE 01/19/2017 1723   LEUKOCYTESUR NEGATIVE 01/19/2017 1723   Sepsis Labs: @LABRCNTIP (procalcitonin:4,lacticidven:4) )No results found for this or any previous visit (from the past 240 hour(s)).   Radiological Exams on Admission: Ct Head Wo Contrast  Result Date: 01/19/2017 CLINICAL DATA:  Pt is presented from [facility]  SNF for evaluation with c/o not being herself, she has been "severely combative and trying to hit staff members," EMS at scene administered total of 5mg  IV Haldol in doses of 2.5mg  and 500 ml EXAM: CT HEAD WITHOUT CONTRAST TECHNIQUE: Contiguous axial images were obtained from the base of the skull through the vertex without intravenous contrast. COMPARISON:  11/29/2016 FINDINGS: Brain: There is central and cortical atrophy. Periventricular white matter changes are consistent with small vessel disease. 2.1 cm hyperdense extra-axial partially calcified mass in the right frontal region appears stable, consistent with meningioma. There is no significant mass effect or edema. No new intra or extra-axial fluid collection or mass. Basilar cisterns and ventricles have a normal appearance. Vascular: There is atherosclerotic calcification of the carotid siphons. Skull: Normal. Negative for fracture or focal lesion. Sinuses/Orbits: No acute finding. Other: None IMPRESSION: 1.  No evidence for acute intracranial abnormality. 2. Stable appearance of 2.1 cm right frontal meningioma without significant mass effect or edema. 3. Atrophy and small vessel disease. Electronically Signed   By: Nolon Nations M.D.   On: 01/19/2017 18:44   Dg Chest Portable 1 View  Result Date: 01/19/2017 CLINICAL DATA:  Altered mental status EXAM: PORTABLE CHEST 1 VIEW COMPARISON:   11/29/2016 FINDINGS: Mild atelectasis or infiltrate at the left base with suspected tiny pleural effusion. Stable mild cardiomegaly. Aortic atherosclerosis. No pneumothorax. IMPRESSION: Suspect tiny left pleural effusion. Mild left basilar atelectasis or infiltrate. Stable cardiomegaly Electronically Signed   By: Donavan Foil M.D.   On: 01/19/2017 18:14    EKG: Independently reviewed. Sinus rhythm.  Assessment/Plan  1. Acute encephalopathy  - Pt presents from SNF with confusion, agitation, and combativeness, trying to hit and bite and SNF personel - She is reported to have mild intermittent confusion at baseline  - She was treated with Haldol 2.5 mg IV x2 by EMS prior to arrival in ED  - Head CT is negative for acute finding, but notable for a stable meningioma  - UA not suggestive of infection; blood work suggests mild dehydration - Plan to check TSH, [name], folate, RPR, ammonia level - Safety sitter at bedside, frequent reorientation and reassurance, avoid restraints where possible   2. CAD  - No anginal complaints  - EKG with no acute abnormality and troponin undetectable  - Continue Lipitor, Toprol, losartan    3. Type II DM  - A1c was 8.3% in February 2018  - Managed at the SNF with metformin only  - Check CBG with meals and qHS  - Start a moderate-intensity SSI    4. Hypertension  - BP at goal  - Continue losartan and Toprol as tolerated    5. Polymyalgia rheumatica - Pt denies pain on admission  - Managed with methotrexate    6. Normocytic anemia  - Hgb is 10.5 on admission and stable relative to priors with no bleeding evident  - There is a CBC in the chart, dated 01/19/17, with Hgb 4.1 that is felt to clearly be in error; no pallor, tachycardia, or appreciable bleeding     DVT prophylaxis: sq Lovenox  Code Status: Full  Family Communication: Updated by phone  Disposition Plan: Observe on med-surg Consults called: None Admission status: Observation    Vianne Bulls, MD [facility] Pager [phone]  If 7PM-7AM, please contact night-coverage [email or web address] Password [email or web address]  01/19/2017, 9:55 PM

## 2017-01-19 NOTE — ED Provider Notes (Signed)
[facility] DEPT Provider Note   CSN: [id number] Arrival date & time: 01/19/17  1623   LEVEL 5 CAVEAT - ALTERED MENTAL STATUS/VERY HARD OF HEARING  History   Chief Complaint Chief Complaint  Patient presents with  . Altered Mental Status    Combative  . Hyperglycemia    HPI [name] is a 83 y.o. female.  HPI  83 year old female sent from her living facility at [facility] for extreme agitation. Patient is very hard of hearing and does not comply with exam or history. EMS gave the patient IV Haldol with no significant relief. She also got a small fluid bolus. Staff reports that the patient has been severely combative and trying to hit staff members. Initial glucose with EMS was over 500.  Past Medical History:  Diagnosis Date  . BPPV (benign paroxysmal positional vertigo)   . CAD (coronary artery disease)    a. Inferior STEMI s/p BMSx2 to RCA. Coronary anomaly with single coronary ostium arising from right sinus of Valsalva and likely LM course between PA and aorta.  . Carpal tunnel syndrome 08/04/2009  . Diabetes mellitus ([place])   . Essential hypertension   . [place], [place] 03/26/2010  . INCONTINENCE, STRESS, FEMALE 10/12/2006  . Inferior MI ([place]) 04/21/2011  . 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 H/o Left eye hemianopsia (longstanding).  . Kidney stone   . Multinodular goiter   . Osteoporosis, unspecified 10/12/2006  . RHINITIS, ALLERGIC 10/12/2006  . Single kidney    a. Absence of right kidney by prior imaging.  . TEMPORAL ARTERITIS 10/12/2006  . UNSTEADY GAIT 01/22/2007    Patient Active Problem List   Diagnosis Date Noted  . Agitated 01/19/2017  . UTI (urinary tract infection) 11/30/2016  . Diabetes mellitus with complication ([place]) 23/55/7322  . Acute lower UTI 11/29/2016  . Acute confusion due to infection   . Acute cystitis without hematuria   . Recurrent falls 10/05/2015  . Acute encephalopathy 09/24/2015  . Fever, myalgia, and generalized  weakness 09/24/2015  . Hyperlipidemia 06/02/2015  . PMR (polymyalgia rheumatica) ([facility]) 12/17/2014  . Vertigo 10/12/2014  . Essential hypertension   . Single kidney   . Poorly controlled type 2 diabetes mellitus ([place]) 02/26/2013  . CAD (coronary artery disease) 04/21/2011  . [place] 03/26/2010  . CARPAL TUNNEL SYNDROME 08/04/2009  . UNSTEADY GAIT 01/22/2007  . TEMPORAL ARTERITIS 10/12/2006  . RHINITIS, ALLERGIC 10/12/2006  . OSTEOPOROSIS, UNSPECIFIED 10/12/2006    Past Surgical History:  Procedure Laterality Date  . ABDOMINAL HYSTERECTOMY    . CATARACT EXTRACTION, BILATERAL    . CORONARY ANGIOPLASTY      OB History    No data available       Home Medications    Prior to Admission medications   Medication Sig Start Date End Date Taking? Authorizing Provider  acetaminophen (TYLENOL) 500 MG tablet Take 500 mg by mouth every 4 (four) hours as needed (for headache, doscomfort, or fever of 99.5-101 F).    Yes [provider]  alum & mag hydroxide-simeth ([place]) 200-200-20 MG/5ML suspension Take 30 mLs by mouth every 6 (six) hours as needed for indigestion or heartburn. NOT TO EXCEED 4 DOSES/24 HOURS   Yes [provider]  atorvastatin (LIPITOR) 20 MG tablet TAKE 1 TABLET (20 MG TOTAL) BY MOUTH DAILY. Patient taking differently: Take 20 mg by mouth at bedtime 11/18/16  Yes Burchette, Alinda Sierras, MD  azelastine (ASTELIN) 0.1 % nasal spray USE 2 SPRAYS  IN EACH NOSTRILTWICE DAILY 10/03/16  Yes Burchette, Alinda Sierras, MD  busPIRone (BUSPAR) 10 MG tablet Take 10 mg by mouth 2 (two) times daily.   Yes [provider]  Cholecalciferol (VITAMIN D) 2000 UNITS CAPS Take 2,000 Units by mouth daily.   Yes [provider]  guaifenesin (ROBITUSSIN) 100 MG/5ML syrup Take 200 mg by mouth every 6 (six) hours as needed for cough.    Yes [provider]  HYDROcodone-acetaminophen (NORCO/VICODIN) 5-325 MG tablet Take 1 tablet by mouth daily as needed (for  pain).    Yes [provider]  loperamide (IMODIUM) 2 MG capsule Take 2 mg by mouth See admin instructions. With each loose stool as needed for diarrhea (max 8 doses/24 hours)   Yes [provider]  losartan (COZAAR) 50 MG tablet TAKE 1 TABLET BY MOUTH EVERY DAY 11/07/16  Yes Burchette, Alinda Sierras, MD  magnesium hydroxide (MILK OF MAGNESIA) 400 MG/5ML suspension Take 30 mLs by mouth at bedtime as needed for mild constipation.   Yes [provider]  metFORMIN (GLUCOPHAGE-XR) 750 MG 24 hr tablet TAKE 1 TABLET DAILY WITH BREAKFAST 09/02/16  Yes Burchette, Alinda Sierras, MD  methotrexate (RHEUMATREX) 2.5 MG tablet TAKE 2 TABLETS BY MOUTH ONCE A WEEK Patient taking differently: Take 5 mg by mouth every Monday 09/02/16  Yes Burchette, Alinda Sierras, MD  metoprolol succinate (TOPROL-XL) 25 MG 24 hr tablet TAKE 1 TABLET (25 MG TOTAL) BY MOUTH DAILY. 07/04/16  Yes Burchette, Alinda Sierras, MD  Neomycin-Bacitracin-Polymyxin (TRIPLE ANTIBIOTIC) [phone] OINT Apply 1 application topically See admin instructions. To affected area(s) for skin tears or abrasions (Clean area with normal saline, apply ointment, cover with Bandaid or gauze and tape & change as needed until healed.)   Yes [provider]  nitroGLYCERIN (NITROSTAT) 0.4 MG SL tablet Place 0.4 mg under the tongue every 5 (five) minutes as needed for chest pain.    Yes [provider]  TRAVATAN Z 0.004 % SOLN ophthalmic solution Place 1 drop into both eyes at bedtime.    Yes [provider]  triamcinolone cream (KENALOG) 0.1 % Apply 1 application topically 2 (two) times daily as needed (for rash).   Yes [provider]    Family History Family History  Problem Relation Age of Onset  . Heart attack Father   . Stroke Mother     Social History Social History  Substance Use Topics  . Smoking status: Never Smoker  . Smokeless tobacco: Never Used  . Alcohol use No     Allergies   Penicillins and  Sulfadiazine   Review of Systems Review of Systems  Unable to perform ROS: Mental status change     Physical Exam Updated Vital Signs BP 127/80 (BP Location: Right Arm)   Pulse (!) 110   Temp 98 F (36.7 C) (Oral)   Resp 18   SpO2 96%   Physical Exam  Constitutional: She appears well-developed and well-nourished. No distress.  HENT:  Head: Normocephalic and atraumatic.  Right Ear: External ear normal.  Left Ear: External ear normal.  Nose: Nose normal.  Eyes: Right eye exhibits no discharge. Left eye exhibits no discharge.  Cardiovascular: Normal rate, regular rhythm and normal heart sounds.   Pulmonary/Chest: Effort normal and breath sounds normal.  Abdominal: Soft. There is no tenderness.  Neurological: She is alert.  Awake, alert. Does not answer orientation questions  Skin: Skin is warm and dry. She is not diaphoretic.  Nursing note and vitals reviewed.  ED Treatments / Results  Labs (all labs ordered are listed, but only abnormal results are displayed) Labs Reviewed  COMPREHENSIVE METABOLIC PANEL - Abnormal; Notable for the following:       Result Value   Glucose, Bld 303 (*)    BUN 21 (*)    Total Protein 6.3 (*)    Albumin 2.9 (*)    ALT 13 (*)    Total Bilirubin 0.2 (*)    GFR calc non Af Amer 58 (*)    All other components within normal limits  CBC WITH DIFFERENTIAL/PLATELET - Abnormal; Notable for the following:    WBC 13.2 (*)    RBC 1.31 (*)    Hemoglobin 4.1 (*)    HCT 12.2 (*)    Neutro Abs 10.7 (*)    All other components within normal limits  URINALYSIS, ROUTINE W REFLEX MICROSCOPIC - Abnormal; Notable for the following:    APPearance HAZY (*)    Glucose, UA >=500 (*)    Protein, ur 30 (*)    Squamous Epithelial / LPF 0-5 (*)    All other components within normal limits  BLOOD GAS, VENOUS - Abnormal; Notable for the following:    pCO2, Ven 38.7 (*)    All other components within normal limits  CBC - Abnormal; Notable for the  following:    RBC 3.41 (*)    Hemoglobin 10.5 (*)    HCT 31.7 (*)    All other components within normal limits  AMMONIA - Abnormal; Notable for the following:    Ammonia <9 (*)    All other components within normal limits  GLUCOSE, CAPILLARY - Abnormal; Notable for the following:    Glucose-Capillary 166 (*)    All other components within normal limits  CBG MONITORING, ED - Abnormal; Notable for the following:    Glucose-Capillary 376 (*)    All other components within normal limits  URINE CULTURE  MRSA PCR SCREENING  TROPONIN I  PROTIME-INR  TSH  CBC WITH DIFFERENTIAL/PLATELET  BASIC METABOLIC PANEL  VITAMIN Z61  FOLATE RBC  RPR  TYPE AND SCREEN  ABO/RH    EKG  EKG Interpretation  Date/Time:  Thursday January 19 2017 17:43:58 EDT Ventricular Rate:  97 PR Interval:    QRS Duration: 94 QT Interval:  355 QTC Calculation: 451 R Axis:   67 Text Interpretation:  Sinus rhythm nonspecific T waves. no significant change since 2017 Confirmed by Sherwood Gambler [phone]) on 01/19/2017 7:39:50 PM       Radiology Ct Head Wo Contrast  Result Date: 01/19/2017 CLINICAL DATA:  Pt is presented from [facility] for evaluation with c/o not being herself, she has been "severely combative and trying to hit staff members," EMS at scene administered total of 5mg  IV Haldol in doses of 2.5mg  and 500 ml EXAM: CT HEAD WITHOUT CONTRAST TECHNIQUE: Contiguous axial images were obtained from the base of the skull through the vertex without intravenous contrast. COMPARISON:  11/29/2016 FINDINGS: Brain: There is central and cortical atrophy. Periventricular white matter changes are consistent with small vessel disease. 2.1 cm hyperdense extra-axial partially calcified mass in the right frontal region appears stable, consistent with meningioma. There is no significant mass effect or edema. No new intra or extra-axial fluid collection or mass. Basilar cisterns and ventricles have a normal appearance.  Vascular: There is atherosclerotic calcification of the carotid siphons. Skull: Normal. Negative for fracture or focal lesion. Sinuses/Orbits: No acute finding. Other: None IMPRESSION: 1.  No evidence  for acute intracranial abnormality. 2. Stable appearance of 2.1 cm right frontal meningioma without significant mass effect or edema. 3. Atrophy and small vessel disease. Electronically Signed   By: Nolon Nations M.D.   On: 01/19/2017 18:44   Dg Chest Portable 1 View  Result Date: 01/19/2017 CLINICAL DATA:  Altered mental status EXAM: PORTABLE CHEST 1 VIEW COMPARISON:  11/29/2016 FINDINGS: Mild atelectasis or infiltrate at the left base with suspected tiny pleural effusion. Stable mild cardiomegaly. Aortic atherosclerosis. No pneumothorax. IMPRESSION: Suspect tiny left pleural effusion. Mild left basilar atelectasis or infiltrate. Stable cardiomegaly Electronically Signed   By: Donavan Foil M.D.   On: 01/19/2017 18:14    Procedures Procedures (including critical care time)  Medications Ordered in ED Medications  alum & mag hydroxide-simeth (MAALOX/MYLANTA) 200-200-20 MG/5ML suspension 30 mL (not administered)  busPIRone (BUSPAR) tablet 10 mg (10 mg Oral Not Given 01/19/17 2200)  guaiFENesin (ROBITUSSIN) 100 MG/5ML solution 200 mg (not administered)  magnesium hydroxide (MILK OF MAGNESIA) suspension 30 mL (not administered)  atorvastatin (LIPITOR) tablet 20 mg (not administered)  losartan (COZAAR) tablet 50 mg (not administered)  azelastine (ASTELIN) 0.1 % nasal spray 1 spray (not administered)  metoprolol succinate (TOPROL-XL) 24 hr tablet 25 mg (not administered)  cholecalciferol (VITAMIN D) tablet 2,000 Units (not administered)  latanoprost (XALATAN) 0.005 % ophthalmic solution 1 drop (not administered)  enoxaparin (LOVENOX) injection 40 mg (40 mg Subcutaneous Not Given 01/19/17 2245)  0.9 %  sodium chloride infusion ( Intravenous New Bag/Given 01/19/17 2309)  acetaminophen (TYLENOL) tablet 650  mg (not administered)    Or  acetaminophen (TYLENOL) suppository 650 mg (not administered)  bisacodyl (DULCOLAX) EC tablet 5 mg (not administered)  ondansetron (ZOFRAN) tablet 4 mg (not administered)    Or  ondansetron (ZOFRAN) injection 4 mg (not administered)  insulin aspart (novoLOG) injection 0-15 Units (not administered)  insulin aspart (novoLOG) injection 0-5 Units (0 Units Subcutaneous Not Given 01/19/17 2321)  haloperidol lactate (HALDOL) injection 5 mg (not administered)  sodium chloride 0.9 % bolus 500 mL (500 mLs Intravenous New Bag/Given 01/19/17 1800)  0.9 %  sodium chloride infusion ( Intravenous Stopped 01/19/17 2002)  LORazepam (ATIVAN) injection 1 mg (1 mg Intravenous Given 01/19/17 2302)     Initial Impression / Assessment and Plan / ED Course  I have reviewed the triage vital signs and the nursing notes.  Pertinent labs & imaging results that were available during my care of the patient were reviewed by me and considered in my medical decision making (see chart for details).     Unclear cause of the patient's acute confusion. While she has had some cognitive decline according to family over the phone, today's actions was a drastic change. She has never been this combative before. Relatively similar presentation that was from a UTI couple months ago but her urine is clear this time. Initial blood work shows a hemoglobin of 4. This was questionable and so it was repeated. I found a later that her initial CBC was taken from the EMS IV and likely this was diluted causing the falsely low hemoglobin. Repeat hemoglobin is 10.5. Thus blood pressure effusion was discontinued and had not been started previously. However given her acute change of mental status, she will be admitted for further workup and care. Final Clinical Impressions(s) / ED Diagnoses   Final diagnoses:  Delirium    New Prescriptions Current Discharge Medication List       Sherwood Gambler, MD 01/20/17 [phone]

## 2017-01-19 NOTE — ED Notes (Signed)
Pt family called to inquire of pt status.

## 2017-01-20 DIAGNOSIS — G9341 Metabolic encephalopathy: Secondary | ICD-10-CM | POA: Diagnosis not present

## 2017-01-20 DIAGNOSIS — E785 Hyperlipidemia, unspecified: Secondary | ICD-10-CM | POA: Diagnosis not present

## 2017-01-20 DIAGNOSIS — E0869 Diabetes mellitus due to underlying condition with other specified complication: Secondary | ICD-10-CM | POA: Diagnosis not present

## 2017-01-20 DIAGNOSIS — I10 Essential (primary) hypertension: Secondary | ICD-10-CM | POA: Diagnosis not present

## 2017-01-20 DIAGNOSIS — G9340 Encephalopathy, unspecified: Secondary | ICD-10-CM

## 2017-01-20 DIAGNOSIS — N390 Urinary tract infection, site not specified: Secondary | ICD-10-CM | POA: Diagnosis not present

## 2017-01-20 DIAGNOSIS — R410 Disorientation, unspecified: Secondary | ICD-10-CM

## 2017-01-20 DIAGNOSIS — G0430 Acute necrotizing hemorrhagic encephalopathy, unspecified: Secondary | ICD-10-CM | POA: Diagnosis not present

## 2017-01-20 DIAGNOSIS — R451 Restlessness and agitation: Secondary | ICD-10-CM | POA: Diagnosis not present

## 2017-01-20 DIAGNOSIS — I6980 Unspecified sequelae of other cerebrovascular disease: Secondary | ICD-10-CM | POA: Diagnosis not present

## 2017-01-20 DIAGNOSIS — R296 Repeated falls: Secondary | ICD-10-CM | POA: Diagnosis not present

## 2017-01-20 LAB — CBC WITH DIFFERENTIAL/PLATELET
BASOS ABS: 0 10*3/uL (ref 0.0–0.1)
Basophils Relative: 0 %
Eosinophils Absolute: 0.1 10*3/uL (ref 0.0–0.7)
Eosinophils Relative: 2 %
HCT: 27.7 % — ABNORMAL LOW (ref 36.0–46.0)
HEMOGLOBIN: 9.1 g/dL — AB (ref 12.0–15.0)
Lymphocytes Relative: 31 %
Lymphs Abs: 2.1 10*3/uL (ref 0.7–4.0)
MCH: 30.5 pg (ref 26.0–34.0)
MCHC: 32.9 g/dL (ref 30.0–36.0)
MCV: 93 fL (ref 78.0–100.0)
MONO ABS: 0.3 10*3/uL (ref 0.1–1.0)
Monocytes Relative: 4 %
Neutro Abs: 4.3 10*3/uL (ref 1.7–7.7)
Neutrophils Relative %: 63 %
Platelets: 199 10*3/uL (ref 150–400)
RBC: 2.98 MIL/uL — AB (ref 3.87–5.11)
RDW: 14.2 % (ref 11.5–15.5)
WBC: 6.8 10*3/uL (ref 4.0–10.5)

## 2017-01-20 LAB — BASIC METABOLIC PANEL
ANION GAP: 4 — AB (ref 5–15)
BUN: 14 mg/dL (ref 6–20)
CHLORIDE: 109 mmol/L (ref 101–111)
CO2: 28 mmol/L (ref 22–32)
Calcium: 8.7 mg/dL — ABNORMAL LOW (ref 8.9–10.3)
Creatinine, Ser: 0.59 mg/dL (ref 0.44–1.00)
GFR calc Af Amer: >60 mL/min (ref >60)
GFR calc non Af Amer: >60 mL/min (ref >60)
GLUCOSE: 172 mg/dL — AB (ref 65–99)
Potassium: 4 mmol/L (ref 3.5–5.1)
Sodium: 141 mmol/L (ref 135–145)

## 2017-01-20 LAB — GLUCOSE, CAPILLARY
GLUCOSE-CAPILLARY: 140 mg/dL — AB (ref 65–99)
GLUCOSE-CAPILLARY: 130 mg/dL — AB (ref 65–99)
GLUCOSE-CAPILLARY: 69 mg/dL (ref 65–99)
GLUCOSE-CAPILLARY: 166 mg/dL — AB (ref 65–99)
Glucose-Capillary: 166 mg/dL — ABNORMAL HIGH (ref 65–99)

## 2017-01-20 LAB — URINE CULTURE

## 2017-01-20 LAB — VITAMIN B12: Vitamin B-12: 356 pg/mL (ref 180–914)

## 2017-01-20 LAB — MRSA PCR SCREENING: MRSA by PCR: NEGATIVE

## 2017-01-20 MED ORDER — SODIUM CHLORIDE 0.45 % IV SOLN
INTRAVENOUS | Status: DC
  Administered 2017-01-20: 13:00:00 via INTRAVENOUS

## 2017-01-20 MED ORDER — LORAZEPAM 2 MG/ML IJ SOLN
0.5000 mg | INTRAMUSCULAR | Status: DC | PRN
  Administered 2017-01-21: 0.5 mg via INTRAVENOUS
  Filled 2017-01-20: qty 1

## 2017-01-20 NOTE — Progress Notes (Signed)
CSW attempted to contact son and daughter [phone]. Will continue to reach out for discharge planning.   Kingsley Spittle, LCSWA Clinical Social Worker [phone]

## 2017-01-20 NOTE — Care Management Note (Signed)
Case Management Note  Patient Details  Name: [name] MRN: [id number] Date of Birth: 1923-01-30  Subjective/Objective:                   83 y.o. female with medical history significant for coronary artery disease with stent, type 2 diabetes mellitus, hypertension, and polymyalgia rheumatica, now presenting from her SNF for evaluation of confusion and combativeness. Patient has reportedly had some very mild confusion and memory issues and a been noted over the past few months, but has not been agitated or combative until today, when at the SNF, patient began to swing at staff, obviously confused, and posing a danger to herself and others. Despite attempts at reassurance and reorientation, patient continued to try to strike the staff members and get out of bed. Prior to this, she had not been voicing any specific complaints, has not had a fever, no cough, and no vomiting or diarrhea. She has had somewhat similar symptoms once before per her family when she had a UTI. EMS was called to the nursing facility and the patient was treated with 2 doses of 2.5 mg IV Haldol and 500 mL normal saline prior to transport to the hospital.     ED Course: Upon arrival to the ED, patient is found to be afebrile, saturating well on room air, and with vital signs stable. EKG features a sinus rhythm and chest x-ray is notable for a possible tiny left pleural effusion and stable cardiomegaly. Noncontrast head CT is negative for acute intracranial abnormality, but notable for a stable 2.1 cm right frontal meningioma without mass effect or edema. Chemistry panels notable for a serum glucose of 303 and elevated BUN to creatinine ratio. CBC initially featured a very low hemoglobin and a mild leukocytosis, but it was felt to be in error and was repeated. Repeat CBC demonstrates a stable normocytic anemia. INR is within the normal limits and troponin is undetectable. Patient was given 500 mL normal saline in the ED. She remained  very confused and combative, requiring physical restraints. She was hemodynamically stable and in no apparent respiratory distress. She'll be observed on medical-surgical unit for ongoing evaluation and management of acute encephalopathy with combativeness.   Action/Plan:  From [facility] SNF Date:  January 20, 2017  Chart reviewed for concurrent status and case management needs.  Will continue to follow patient progress.  Discharge Planning: following for needs  Expected discharge date: [id number]  Velva Harman, BSN, [place], [place]   Expected Discharge Date:                  Expected Discharge Plan:  [facility]  In-House Referral:  Clinical Social Work  Discharge planning Services  CM Consult  Post Acute Care Choice:    Choice offered to:     DME Arranged:    DME Agency:     HH Arranged:    [place] Agency:     Status of Service:  In process, will continue to follow  If discussed at Long Length of Stay Meetings, dates discussed:    Additional Comments:  Leeroy Cha, RN 01/20/2017, 9:20 AM

## 2017-01-20 NOTE — Progress Notes (Addendum)
PROGRESS NOTE    [name]  KGM:[id number] DOB: [birth year] DOA: 01/19/2017 PCP: Eulas Post, MD    Brief Narrative:  83 year old female who presented from the skilled nursing facility due to confusion and altered mental status. Patient known to have coronary artery disease status post angioplasty, type 2 diabetes mellitus, hypertension, polymyalgia rheumatica. Progressive decline of her mentation over last few months, with positive confusion, agitation and combativeness over last 24 hours prior to hospitalization, patient being aggressive to staff and posing a danger to herself and others. Symptoms would refractive to known medical therapy. On initial physical examination, patient was confused and combative, she required physical restraints. Blood pressure 119/61, heart rate 90, respiratory rate 14, oxygen saturation 97%, temperature 98. Mucous membranes were moist, her lungs were clear to auscultation bilaterally, heart sounds present and rhythmic, abdomen was soft nontender, lower extremities no edema. Sodium 136, potassium 4.0, chloride 11, bicarbonate 25, glucose 303, BUN 21, creatinine 0.85, white count 10.5, hemoglobin 10.5, hematocrit 31.7, platelets 236. Head CT with no acute intracranial abnormality, atrophy and small vessel disease, stable appearance 2.1 cm right frontal meningioma without severe mass effect or edema. Urinalysis negative for infection. Chest film rotated left side, hypoinflated, questionable atelectasis left base. EKG with normal sinus rhythm, normal axis.  Patient admitted to the hospital with working diagnosis of metabolic encephalopathy complicated with delirium.   Assessment & Plan:   Principal Problem:   Acute encephalopathy Active Problems:   CAD (coronary artery disease)   Poorly controlled type 2 diabetes mellitus (HCC)   Essential hypertension   PMR (polymyalgia rheumatica) (HCC)   Agitated  1. Worsening metabolic encephalopathy. Will continue  neuro checks and aspiration precautions, patient receive lorazepam and haldol last night. This am patient not responsive to voice, localizes pain. No apparent respiratory distress. Will continue hydration with Iv fluids, and will use very low dose lorazepam, patient did not respond to Haldol.   2. T2DM. Hyperglycemia on admission up to 300, positive glucosuria greater than 500. Will continue capillary glucose check qid. Patient not able to eat this am due to encephalopathy. Hold on metformin.   3. HTN. On losartan and metoprolol, will add hydralazine as needed. Continue IV fluids.   4. Anemia. Stable hb and hct with no signs of bleeding.   5. Depression. Continue buspar,     DVT prophylaxis: enoxaparin  Code Status: Full  Family Communication: I spoke with patient's son over the phone and all questions were addressed.  Disposition Plan: SNF  Consultants:     Procedures:     Antimicrobials:     Subjective: Patient not responsive, not following commands. Had lorazepam and haldol, all information from nursing at the bedside.   Objective: Vitals:   01/19/17 1801 01/19/17 2035 01/19/17 2245 01/20/17 0543  BP:  138/70 127/80 (!) 163/61  Pulse:  93 (!) 110 81  Resp:  18 18 20   Temp: 98 F (36.7 C)  98 F (36.7 C) 97.9 F (36.6 C)  TempSrc: Oral  Oral Axillary  SpO2:  96%  97%    Intake/Output Summary (Last 24 hours) at 01/20/17 1623 Last data filed at 01/20/17 0600  Gross per 24 hour  Intake          1513.75 ml  Output                0 ml  Net          1513.75 ml   There were no vitals filed for  this visit.  Examination:  General exam: deconditioned E ENT: mild pallor, no icterus, oral mucosa dry.  Respiratory system: Clear to auscultation. Respiratory effort normal. No wheezing, rale or rhonchi.  Cardiovascular system: S1 & S2 heard, RRR. No JVD, murmurs, rubs, gallops or clicks. No pedal edema. Gastrointestinal system: Abdomen is nondistended, soft and  nontender. No organomegaly or masses felt. Normal bowel sounds heard. Central nervous system: Somnolent, localizes pain, not following commands.  Extremities: Symmetric 5 x 5 power. Skin: No rashes, lesions or ulcers     Data Reviewed: I have personally reviewed following labs and imaging studies  CBC:  Recent Labs Lab 01/19/17 1800 01/19/17 2023 01/20/17 0652  WBC 13.2* 10.5 6.8  NEUTROABS 10.7*  --  4.3  HGB 4.1* 10.5* 9.1*  HCT 12.2* 31.7* 27.7*  MCV 93.1 93.0 93.0  PLT 283 236 572   Basic Metabolic Panel:  Recent Labs Lab 01/19/17 1800 01/20/17 0652  NA 136 141  K 4.0 4.0  CL 101 109  CO2 25 28  GLUCOSE 303* 172*  BUN 21* 14  CREATININE 0.85 0.59  CALCIUM 9.1 8.7*   GFR: CrCl cannot be calculated (Unknown ideal weight.). Liver Function Tests:  Recent Labs Lab 01/19/17 1800  AST 24  ALT 13*  ALKPHOS 91  BILITOT 0.2*  PROT 6.3*  ALBUMIN 2.9*   No results for input(s): LIPASE, AMYLASE in the last 168 hours.  Recent Labs Lab 01/19/17 2239  AMMONIA <9*   Coagulation Profile:  Recent Labs Lab 01/19/17 2023  INR 0.97   Cardiac Enzymes:  Recent Labs Lab 01/19/17 1800  TROPONINI <0.03   BNP (last 3 results) No results for input(s): PROBNP in the last 8760 hours. HbA1C: No results for input(s): HGBA1C in the last 72 hours. CBG:  Recent Labs Lab 01/19/17 1635 01/19/17 2320 01/20/17 0728 01/20/17 1220  GLUCAP 376* 166* 140* 130*   Lipid Profile: No results for input(s): CHOL, HDL, LDLCALC, TRIG, CHOLHDL, LDLDIRECT in the last 72 hours. Thyroid Function Tests:  Recent Labs  01/19/17 2239  TSH 1.410   Anemia Panel:  Recent Labs  01/19/17 2239  VITAMINB12 356   Sepsis Labs: No results for input(s): PROCALCITON, LATICACIDVEN in the last 168 hours.  Recent Results (from the past 240 hour(s))  Urine culture     Status: Abnormal   Collection Time: 01/19/17  5:23 PM  Result Value Ref Range Status   Specimen Description  URINE, RANDOM  Final   Special Requests NONE  Final   Culture MULTIPLE SPECIES PRESENT, SUGGEST RECOLLECTION (A)  Final   Report Status 01/20/2017 FINAL  Final  MRSA PCR Screening     Status: None   Collection Time: 01/19/17 11:30 PM  Result Value Ref Range Status   MRSA by PCR NEGATIVE NEGATIVE Final    Comment:        The GeneXpert MRSA Assay (FDA approved for NASAL specimens only), is one component of a comprehensive MRSA colonization surveillance program. It is not intended to diagnose MRSA infection nor to guide or monitor treatment for MRSA infections.          Radiology Studies: Ct Head Wo Contrast  Result Date: 01/19/2017 CLINICAL DATA:  Pt is presented from [facility] for evaluation with c/o not being herself, she has been "severely combative and trying to hit staff members," EMS at scene administered total of 5mg  IV Haldol in doses of 2.5mg  and 500 ml EXAM: CT HEAD WITHOUT CONTRAST TECHNIQUE: Contiguous axial images  were obtained from the base of the skull through the vertex without intravenous contrast. COMPARISON:  11/29/2016 FINDINGS: Brain: There is central and cortical atrophy. Periventricular white matter changes are consistent with small vessel disease. 2.1 cm hyperdense extra-axial partially calcified mass in the right frontal region appears stable, consistent with meningioma. There is no significant mass effect or edema. No new intra or extra-axial fluid collection or mass. Basilar cisterns and ventricles have a normal appearance. Vascular: There is atherosclerotic calcification of the carotid siphons. Skull: Normal. Negative for fracture or focal lesion. Sinuses/Orbits: No acute finding. Other: None IMPRESSION: 1.  No evidence for acute intracranial abnormality. 2. Stable appearance of 2.1 cm right frontal meningioma without significant mass effect or edema. 3. Atrophy and small vessel disease. Electronically Signed   By: Nolon Nations M.D.   On: 01/19/2017  18:44   Dg Chest Portable 1 View  Result Date: 01/19/2017 CLINICAL DATA:  Altered mental status EXAM: PORTABLE CHEST 1 VIEW COMPARISON:  11/29/2016 FINDINGS: Mild atelectasis or infiltrate at the left base with suspected tiny pleural effusion. Stable mild cardiomegaly. Aortic atherosclerosis. No pneumothorax. IMPRESSION: Suspect tiny left pleural effusion. Mild left basilar atelectasis or infiltrate. Stable cardiomegaly Electronically Signed   By: Donavan Foil M.D.   On: 01/19/2017 18:14        Scheduled Meds: . atorvastatin  20 mg Oral q1800  . azelastine  1 spray Each Nare BID  . busPIRone  10 mg Oral BID  . cholecalciferol  2,000 Units Oral Daily  . enoxaparin (LOVENOX) injection  40 mg Subcutaneous QHS  . insulin aspart  0-15 Units Subcutaneous TID WC  . insulin aspart  0-5 Units Subcutaneous QHS  . latanoprost  1 drop Both Eyes QHS  . losartan  50 mg Oral Daily  . metoprolol succinate  25 mg Oral Daily   Continuous Infusions: . sodium chloride 75 mL/hr at 01/20/17 1230     LOS: 0 days        Mauricio Gerome Apley, MD [facility] Pager [phone]  If 7PM-7AM, please contact night-coverage [email or web address] Password [facility] 01/20/2017, 4:23 PM

## 2017-01-20 NOTE — Clinical Social Work Note (Signed)
Clinical Social Work Assessment  Patient Details  Name: [name] MRN: [id number] Date of Birth: [birth year]  Date of referral:  01/20/17               Reason for consult:  Discharge Planning                Permission sought to share information with:  Facility Art therapist granted to share information::  Yes, Verbal Permission Granted  Name::        Agency::     Relationship::     Contact Information:     Housing/Transportation Living arrangements for the past 2 months:  [place] ([facility] ) Source of Information:  Adult Children [name] ) Patient Interpreter Needed:  None Criminal Activity/Legal Involvement Pertinent to Current Situation/Hospitalization:    Significant Relationships:  Adult Children Lives with:  Facility Resident Do you feel safe going back to the place where you live?  Yes Need for family participation in patient care:  Yes (Comment)  Care giving concerns:  Patients daughter, [name], has concerns regarding patients weight. Updated weight not noted in chart. No other concerns at this time.    Social Worker assessment / plan:  CSW spoke with patients daughter, [name], via phone regarding discharge plans. Patient is a resident at [facility]. Patient moved into facility in April 2018. Daughter does not state any concerns with facility at this time and patient will return to [facility] once stable for discharge. CSW will reach out to facility once stable for discharge.   Employment status:  Retired Nurse, adult PT Recommendations:  Not assessed at this time Information / Referral to community resources:     Patient/Family's Response to care:  Daughter appreciated CSW.   Patient/Family's Understanding of and Emotional Response to Diagnosis, Current Treatment, and Prognosis:  Daughter understood current treatment and prognosis.   Emotional Assessment Appearance:   Appears stated age Attitude/Demeanor/Rapport:  Unable to Assess Affect (typically observed):  Unable to Assess Orientation:  Oriented to Self Alcohol / Substance use:    Psych involvement (Current and /or in the community):  No (Comment)  Discharge Needs  Concerns to be addressed:  No discharge needs identified Readmission within the last 30 days:  No Current discharge risk:  None Barriers to Discharge:  No Barriers Identified   [name] Anna, LCSW 01/20/2017, 2:26 PM

## 2017-01-21 DIAGNOSIS — G9340 Encephalopathy, unspecified: Secondary | ICD-10-CM | POA: Diagnosis not present

## 2017-01-21 DIAGNOSIS — R410 Disorientation, unspecified: Secondary | ICD-10-CM | POA: Diagnosis not present

## 2017-01-21 DIAGNOSIS — E1165 Type 2 diabetes mellitus with hyperglycemia: Secondary | ICD-10-CM | POA: Diagnosis not present

## 2017-01-21 DIAGNOSIS — G9341 Metabolic encephalopathy: Secondary | ICD-10-CM | POA: Diagnosis not present

## 2017-01-21 DIAGNOSIS — R4182 Altered mental status, unspecified: Secondary | ICD-10-CM | POA: Diagnosis not present

## 2017-01-21 DIAGNOSIS — I10 Essential (primary) hypertension: Secondary | ICD-10-CM | POA: Diagnosis not present

## 2017-01-21 LAB — CBC WITH DIFFERENTIAL/PLATELET
BASOS ABS: 0 10*3/uL (ref 0.0–0.1)
Basophils Relative: 0 %
EOS ABS: 0 10*3/uL (ref 0.0–0.7)
Eosinophils Relative: 0 %
HCT: 30.9 % — ABNORMAL LOW (ref 36.0–46.0)
HEMOGLOBIN: 10.1 g/dL — AB (ref 12.0–15.0)
LYMPHS ABS: 1.6 10*3/uL (ref 0.7–4.0)
Lymphocytes Relative: 23 %
MCH: 30.1 pg (ref 26.0–34.0)
MCHC: 32.7 g/dL (ref 30.0–36.0)
MCV: 92 fL (ref 78.0–100.0)
Monocytes Absolute: 0.4 10*3/uL (ref 0.1–1.0)
Monocytes Relative: 5 %
NEUTROS PCT: 72 %
Neutro Abs: 5.2 10*3/uL (ref 1.7–7.7)
PLATELETS: 192 10*3/uL (ref 150–400)
RBC: 3.36 MIL/uL — ABNORMAL LOW (ref 3.87–5.11)
RDW: 14.1 % (ref 11.5–15.5)
WBC: 7.2 10*3/uL (ref 4.0–10.5)

## 2017-01-21 LAB — BASIC METABOLIC PANEL
ANION GAP: 9 (ref 5–15)
BUN: 8 mg/dL (ref 6–20)
CHLORIDE: 102 mmol/L (ref 101–111)
CO2: 26 mmol/L (ref 22–32)
Calcium: 8.9 mg/dL (ref 8.9–10.3)
Creatinine, Ser: 0.53 mg/dL (ref 0.44–1.00)
Glucose, Bld: 196 mg/dL — ABNORMAL HIGH (ref 65–99)
POTASSIUM: 3.7 mmol/L (ref 3.5–5.1)
SODIUM: 137 mmol/L (ref 135–145)

## 2017-01-21 LAB — GLUCOSE, CAPILLARY
GLUCOSE-CAPILLARY: 123 mg/dL — AB (ref 65–99)
GLUCOSE-CAPILLARY: 185 mg/dL — AB (ref 65–99)

## 2017-01-21 LAB — RPR: RPR Ser Ql: NONREACTIVE

## 2017-01-21 MED ORDER — INSULIN ASPART 100 UNIT/ML ~~LOC~~ SOLN: 0.0000 [IU] | Freq: Three times a day (TID) | SUBCUTANEOUS | 11 refills | Status: DC

## 2017-01-21 MED ORDER — INSULIN PEN NEEDLE 30G X 8 MM MISC: 1.0000 | 0 refills | Status: DC | PRN

## 2017-01-21 MED ORDER — BLOOD GLUCOSE METER KIT: PACK | 0 refills | Status: DC

## 2017-01-21 NOTE — Discharge Summary (Signed)
Physician Discharge Summary  [name] FIE:[id number] DOB: 1923/07/29 DOA: 01/19/2017  PCP: Eulas Post, MD  Admit date: 01/19/2017 Discharge date: 01/21/2017  Admitted From: Assisted living  Disposition:  Assisted living   Recommendations for Outpatient Follow-up:  1. Follow up with PCP in 1- week. 2. Patient has been placed on insulin sliding scale with Insulin Aspart.  3. Check capillary glucose before meals and at night, keep a log.   Home Health: No  Equipment/Devices: Patient on a wheelchair.   Discharge Condition: Stable CODE STATUS: Full  Diet recommendation: Heart Healthy / Carb Modified  Brief/Interim Summary:  83 year old female who presented from the skilled nursing facility due to confusion and altered mental status. Patient known to have coronary artery disease status post angioplasty, type 2 diabetes mellitus, hypertension, polymyalgia rheumatica. Progressive decline of her mentation over last few months, with positive confusion, agitation and combativeness over last 24 hours prior to hospitalization, patient being aggressive to staff and posing a danger to herself and others. Symptoms were refractive to outpatient medical therapy. On initial physical examination, patient was confused and combative, she required physical restraints. Blood pressure 119/61, heart rate 90, respiratory rate 14, oxygen saturation 97%, temperature 98. Mucous membranes were moist, her lungs were clear to auscultation bilaterally, heart sounds present and rhythmic, abdomen was soft nontender, lower extremities no edema. Sodium 136, potassium 4.0, chloride 11, bicarbonate 25, glucose 303, BUN 21, creatinine 0.85, white count 10.5, hemoglobin 10.5, hematocrit 31.7, platelets 236. Head CT with no acute intracranial abnormality, atrophy and small vessel disease, stable appearance 2.1 cm right frontal meningioma without severe mass effect or edema. Urinalysis negative for infection. Chest film rotated  left side, hypoinflated, questionable atelectasis left base. EKG with normal sinus rhythm, normal axis.  Patient was admitted to the hospital with working diagnosis of metabolic encephalopathy complicated with delirium.  1. Metabolic encephalopathy with delirium. Patient received lorazepam and Haldol with improvement of her agitation, the first 24 hours of hospitalization patient was deeply sedated by June 9 she was more awake and alert. I spoke with patient's son, power of attorney, apparently patient has very poor functional status, tends to sleep most of day, she is nonambulatory, wheelchair bound. On admission serum glucose was 166, certainly hyperglycemia can trigger encephalopathy. Glycemia was controlled, patient will be discharged on metformin and insulin sliding-scale has been added to her regimen. No signs of systemic infection or CVA.   2. Type 2 diabetes mellitus with uncontrolled hyperglycemia. On admission serum glucose 303, positive glucosuria greater than 500. Patient received IV fluids, insulin coverage and monitoring with insulin sliding-scale, capillary glucose over last 24 hours 140, 130, 69, 166, 166. Patient will resume metformin at her home dose 750 mg daily, will add insulin sliding-scale with instructions to check capillary glucose  before meals and at night, instructions to keep a log.  3. Hypertension. Patient continue losartan and metoprolol with no major complications. She currently well to IV fluids.  4. Chronic anemia, multifactorial. No signs of acute bleeding, hemoglobin and hematocrit remained stable.  5. Depression. Patient will continue BuSpar.   Discharge Diagnoses:  Principal Problem:   Acute encephalopathy Active Problems:   CAD (coronary artery disease)   Poorly controlled type 2 diabetes mellitus (HCC)   Essential hypertension   PMR (polymyalgia rheumatica) (HCC)   Agitated    Discharge Instructions   Allergies as of 01/21/2017      Reactions    Penicillins Hives, Other (See Comments)   Has patient had  a PCN reaction causing immediate rash, facial/tongue/throat swelling, SOB or lightheadedness with hypotension: Yes Has patient had a PCN reaction causing severe rash involving mucus membranes or skin necrosis: Unk Has patient had a PCN reaction that required hospitalization: Unk Has patient had a PCN reaction occurring within the last 10 years: Unk If all of the above answers are "NO", then may proceed with Cephalosporin use.   Sulfadiazine Hives      Medication List    TAKE these medications   acetaminophen 500 MG tablet Commonly known as:  TYLENOL Take 500 mg by mouth every 4 (four) hours as needed (for headache, doscomfort, or fever of 99.5-101 F).   atorvastatin 20 MG tablet Commonly known as:  LIPITOR TAKE 1 TABLET (20 MG TOTAL) BY MOUTH DAILY. What changed:  See the new instructions.   azelastine 0.1 % nasal spray Commonly known as:  ASTELIN USE 2 SPRAYS IN EACH NOSTRILTWICE DAILY   blood glucose meter kit and supplies Dispense based on patient and insurance preference. Use up to four times daily as directed. (FOR ICD-9 250.00, 250.01). Please check sugars before meals and at night. Keep a log.   busPIRone 10 MG tablet Commonly known as:  BUSPAR Take 10 mg by mouth 2 (two) times daily.   guaifenesin 100 MG/5ML syrup Commonly known as:  ROBITUSSIN Take 200 mg by mouth every 6 (six) hours as needed for cough.   HYDROcodone-acetaminophen 5-325 MG tablet Commonly known as:  NORCO/VICODIN Take 1 tablet by mouth daily as needed (for pain).   insulin aspart 100 UNIT/ML injection Commonly known as:  novoLOG Inject 0-15 Units into the skin 3 (three) times daily with meals. For glucose 150-200 give 2 units, 201-250 give 4 units, 251-300 give 6 units, 301-350 give 8 units, 351 or greater give 10 units.   Insulin Pen Needle 30G X 8 MM Misc Commonly known as:  NOVOFINE Inject 10 each into the skin as needed.    loperamide 2 MG capsule Commonly known as:  IMODIUM Take 2 mg by mouth See admin instructions. With each loose stool as needed for diarrhea (max 8 doses/24 hours)   losartan 50 MG tablet Commonly known as:  COZAAR TAKE 1 TABLET BY MOUTH EVERY DAY   magnesium hydroxide 400 MG/5ML suspension Commonly known as:  MILK OF MAGNESIA Take 30 mLs by mouth at bedtime as needed for mild constipation.   metFORMIN 750 MG 24 hr tablet Commonly known as:  GLUCOPHAGE-XR TAKE 1 TABLET DAILY WITH BREAKFAST   methotrexate 2.5 MG tablet Commonly known as:  RHEUMATREX TAKE 2 TABLETS BY MOUTH ONCE A WEEK What changed:  See the new instructions.   metoprolol succinate 25 MG 24 hr tablet Commonly known as:  TOPROL-XL TAKE 1 TABLET (25 MG TOTAL) BY MOUTH DAILY.   [place] 200-200-20 MG/5ML suspension Generic drug:  alum & mag hydroxide-simeth Take 30 mLs by mouth every 6 (six) hours as needed for indigestion or heartburn. NOT TO EXCEED 4 DOSES/24 HOURS   nitroGLYCERIN 0.4 MG SL tablet Commonly known as:  NITROSTAT Place 0.4 mg under the tongue every 5 (five) minutes as needed for chest pain.   TRAVATAN Z 0.004 % Soln ophthalmic solution Generic drug:  Travoprost (BAK Free) Place 1 drop into both eyes at bedtime.   triamcinolone cream 0.1 % Commonly known as:  KENALOG Apply 1 application topically 2 (two) times daily as needed (for rash).   TRIPLE ANTIBIOTIC [phone] Oint Apply 1 application topically See admin instructions. To affected  area(s) for skin tears or abrasions (Clean area with normal saline, apply ointment, cover with Bandaid or gauze and tape & change as needed until healed.)   Vitamin D 2000 units Caps Take 2,000 Units by mouth daily.       Allergies  Allergen Reactions  . Penicillins Hives and Other (See Comments)    Has patient had a PCN reaction causing immediate rash, facial/tongue/throat swelling, SOB or lightheadedness with hypotension: Yes Has patient had a PCN  reaction causing severe rash involving mucus membranes or skin necrosis: Unk Has patient had a PCN reaction that required hospitalization: Unk Has patient had a PCN reaction occurring within the last 10 years: Unk If all of the above answers are "NO", then may proceed with Cephalosporin use.  . Sulfadiazine Hives    Consultations:     Procedures/Studies: Ct Head Wo Contrast  Result Date: 01/19/2017 CLINICAL DATA:  Pt is presented from [facility] for evaluation with c/o not being herself, she has been "severely combative and trying to hit staff members," EMS at scene administered total of 38m IV Haldol in doses of 2.519mand 500 ml EXAM: CT HEAD WITHOUT CONTRAST TECHNIQUE: Contiguous axial images were obtained from the base of the skull through the vertex without intravenous contrast. COMPARISON:  11/29/2016 FINDINGS: Brain: There is central and cortical atrophy. Periventricular white matter changes are consistent with small vessel disease. 2.1 cm hyperdense extra-axial partially calcified mass in the right frontal region appears stable, consistent with meningioma. There is no significant mass effect or edema. No new intra or extra-axial fluid collection or mass. Basilar cisterns and ventricles have a normal appearance. Vascular: There is atherosclerotic calcification of the carotid siphons. Skull: Normal. Negative for fracture or focal lesion. Sinuses/Orbits: No acute finding. Other: None IMPRESSION: 1.  No evidence for acute intracranial abnormality. 2. Stable appearance of 2.1 cm right frontal meningioma without significant mass effect or edema. 3. Atrophy and small vessel disease. Electronically Signed   By: ElNolon Nations.D.   On: 01/19/2017 18:44   Dg Chest Portable 1 View  Result Date: 01/19/2017 CLINICAL DATA:  Altered mental status EXAM: PORTABLE CHEST 1 VIEW COMPARISON:  11/29/2016 FINDINGS: Mild atelectasis or infiltrate at the left base with suspected tiny pleural effusion.  Stable mild cardiomegaly. Aortic atherosclerosis. No pneumothorax. IMPRESSION: Suspect tiny left pleural effusion. Mild left basilar atelectasis or infiltrate. Stable cardiomegaly Electronically Signed   By: KiDonavan Foil.D.   On: 01/19/2017 18:14       Subjective: No further agitation, patient opens eyes to voice. Apparently back to her base mentation.   Discharge Exam: Vitals:   01/20/17 2048 01/21/17 0547  BP: (!) 141/76 (!) 184/85  Pulse: 88 99  Resp: 18 16  Temp: 98.6 F (37 C) 98.5 F (36.9 C)   Vitals:   01/20/17 0543 01/20/17 1900 01/20/17 2048 01/21/17 0547  BP: (!) 163/61 (!) 150/62 (!) 141/76 (!) 184/85  Pulse: 81 93 88 99  Resp: _0 Temp: 97.9 F (36.6 C) 98 F (36.7 C) 98.6 F (37 C) 98.5 F (36.9 C)  TempSrc: Axillary Axillary Oral Axillary  SpO2: 97% 95% 98% 99%    General: Pt is alert, awake, not in acute distress E ENT. Mild pallor, no icterus, oral mucosa moist.  Cardiovascular: RRR, S1/S2 +, no rubs, no gallops Respiratory: CTA bilaterally, no wheezing, no rhonchi Abdominal: Soft, NT, ND, bowel sounds + Extremities: no edema, no cyanosis    The results of  significant diagnostics from this hospitalization (including imaging, microbiology, ancillary and laboratory) are listed below for reference.     Microbiology: Recent Results (from the past 240 hour(s))  Urine culture     Status: Abnormal   Collection Time: 01/19/17  5:23 PM  Result Value Ref Range Status   Specimen Description URINE, RANDOM  Final   Special Requests NONE  Final   Culture MULTIPLE SPECIES PRESENT, SUGGEST RECOLLECTION (A)  Final   Report Status 01/20/2017 FINAL  Final  MRSA PCR Screening     Status: None   Collection Time: 01/19/17 11:30 PM  Result Value Ref Range Status   MRSA by PCR NEGATIVE NEGATIVE Final    Comment:        The GeneXpert MRSA Assay (FDA approved for NASAL specimens only), is one component of a comprehensive MRSA colonization surveillance  program. It is not intended to diagnose MRSA infection nor to guide or monitor treatment for MRSA infections.      Labs: BNP (last 3 results) No results for input(s): BNP in the last 8760 hours. Basic Metabolic Panel:  Recent Labs Lab 01/19/17 1800 01/20/17 0652 01/21/17 0738  NA 136 141 137  K 4.0 4.0 3.7  CL 101 109 102  CO2 _0 GLUCOSE 303* 172* 196*  BUN 21* 14 8  CREATININE 0.85 0.59 0.53  CALCIUM 9.1 8.7* 8.9   Liver Function Tests:  Recent Labs Lab 01/19/17 1800  AST 24  ALT 13*  ALKPHOS 91  BILITOT 0.2*  PROT 6.3*  ALBUMIN 2.9*   No results for input(s): LIPASE, AMYLASE in the last 168 hours.  Recent Labs Lab 01/19/17 2239  AMMONIA <9*   CBC:  Recent Labs Lab 01/19/17 1800 01/19/17 2023 01/20/17 0652 01/21/17 0738  WBC 13.2* 10.5 6.8 7.2  NEUTROABS 10.7*  --  4.3 5.2  HGB 4.1* 10.5* 9.1* 10.1*  HCT 12.2* 31.7* 27.7* 30.9*  MCV 93.1 93.0 93.0 92.0  PLT 283 236 199 192   Cardiac Enzymes:  Recent Labs Lab 01/19/17 1800  TROPONINI <0.03   BNP: Invalid input(s): POCBNP CBG:  Recent Labs Lab 01/20/17 0728 01/20/17 1220 01/20/17 1731 01/20/17 1915 01/20/17 2051  GLUCAP 140* 130* 69 166* 166*   D-Dimer No results for input(s): DDIMER in the last 72 hours. Hgb A1c No results for input(s): HGBA1C in the last 72 hours. Lipid Profile No results for input(s): CHOL, HDL, LDLCALC, TRIG, CHOLHDL, LDLDIRECT in the last 72 hours. Thyroid function studies  Recent Labs  01/19/17 2239  TSH 1.410   Anemia work up  Recent Labs  01/19/17 2239  VITAMINB12 356   Urinalysis    Component Value Date/Time   COLORURINE YELLOW 01/19/2017 1723   APPEARANCEUR HAZY (A) 01/19/2017 1723   LABSPEC 1.027 01/19/2017 1723   PHURINE 5.0 01/19/2017 1723   GLUCOSEU >=500 (A) 01/19/2017 1723   HGBUR NEGATIVE 01/19/2017 1723   BILIRUBINUR NEGATIVE 01/19/2017 1723   BILIRUBINUR neg 12/03/2013 1135   KETONESUR NEGATIVE 01/19/2017 1723    PROTEINUR 30 (A) 01/19/2017 1723   UROBILINOGEN 0.2 12/03/2013 1135   UROBILINOGEN 1.0 11/14/2013 1325   NITRITE NEGATIVE 01/19/2017 1723   LEUKOCYTESUR NEGATIVE 01/19/2017 1723   Sepsis Labs Invalid input(s): PROCALCITONIN,  WBC,  LACTICIDVEN Microbiology Recent Results (from the past 240 hour(s))  Urine culture     Status: Abnormal   Collection Time: 01/19/17  5:23 PM  Result Value Ref Range Status   Specimen Description URINE, RANDOM  Final  Special Requests NONE  Final   Culture MULTIPLE SPECIES PRESENT, SUGGEST RECOLLECTION (A)  Final   Report Status 01/20/2017 FINAL  Final  MRSA PCR Screening     Status: None   Collection Time: 01/19/17 11:30 PM  Result Value Ref Range Status   MRSA by PCR NEGATIVE NEGATIVE Final    Comment:        The GeneXpert MRSA Assay (FDA approved for NASAL specimens only), is one component of a comprehensive MRSA colonization surveillance program. It is not intended to diagnose MRSA infection nor to guide or monitor treatment for MRSA infections.      Time coordinating discharge: 45 minutes  SIGNED:   Tawni Millers, MD  [facility] 01/21/2017, 10:36 AM Pager   If 7PM-7AM, please contact night-coverage [email or web address] Password [email or web address]

## 2017-01-21 NOTE — Progress Notes (Signed)
Patient awake enough to take medications with coaxing. Medications were crushed then given with apple sauce. Tolerated well. Drink a few sips of apple juice and cranberry juice. Will open eyes for 2-3 seconds then close back. No facial grimaces of pain.

## 2017-01-21 NOTE — Progress Notes (Signed)
Discharged to [facility], report received by Mikle Bosworth, Med tech. Patient will be going to room 109. Notified son (POA) for consent to sign name for discharge papers. Discussed discharge instructions with patient. Patient left unit via stretcher, accompanied by EMS personnel. No signs of distress. Reason son gave permission to sign discharge papers is that patient is confused and unable to sign name.

## 2017-01-21 NOTE — Progress Notes (Signed)
Patient is set to discharge to [facility] today. Patient & daughter, Danton Clap, aware. Discharge packet given to RN. [facility] called for transport.   Kingsley Spittle, LCSWA Clinical Social Worker [phone]

## 2017-01-23 DIAGNOSIS — N390 Urinary tract infection, site not specified: Secondary | ICD-10-CM | POA: Diagnosis not present

## 2017-01-23 DIAGNOSIS — E0869 Diabetes mellitus due to underlying condition with other specified complication: Secondary | ICD-10-CM | POA: Diagnosis not present

## 2017-01-23 DIAGNOSIS — G9341 Metabolic encephalopathy: Secondary | ICD-10-CM | POA: Diagnosis not present

## 2017-01-23 DIAGNOSIS — E785 Hyperlipidemia, unspecified: Secondary | ICD-10-CM | POA: Diagnosis not present

## 2017-01-23 DIAGNOSIS — I6980 Unspecified sequelae of other cerebrovascular disease: Secondary | ICD-10-CM | POA: Diagnosis not present

## 2017-01-23 DIAGNOSIS — M6281 Muscle weakness (generalized): Secondary | ICD-10-CM | POA: Diagnosis not present

## 2017-01-23 DIAGNOSIS — G0430 Acute necrotizing hemorrhagic encephalopathy, unspecified: Secondary | ICD-10-CM | POA: Diagnosis not present

## 2017-01-23 DIAGNOSIS — R269 Unspecified abnormalities of gait and mobility: Secondary | ICD-10-CM | POA: Diagnosis not present

## 2017-01-23 DIAGNOSIS — F0281 Dementia in other diseases classified elsewhere with behavioral disturbance: Secondary | ICD-10-CM | POA: Diagnosis not present

## 2017-01-23 DIAGNOSIS — R296 Repeated falls: Secondary | ICD-10-CM | POA: Diagnosis not present

## 2017-01-24 LAB — FOLATE RBC
Folate, Hemolysate: 328.4 ng/mL
Folate, RBC: 1066 ng/mL (ref >498)
Hematocrit: 30.8 % — ABNORMAL LOW (ref 34.0–46.6)

## 2017-01-25 DIAGNOSIS — G0430 Acute necrotizing hemorrhagic encephalopathy, unspecified: Secondary | ICD-10-CM | POA: Diagnosis not present

## 2017-01-25 DIAGNOSIS — R296 Repeated falls: Secondary | ICD-10-CM | POA: Diagnosis not present

## 2017-01-25 DIAGNOSIS — E785 Hyperlipidemia, unspecified: Secondary | ICD-10-CM | POA: Diagnosis not present

## 2017-01-25 DIAGNOSIS — E0869 Diabetes mellitus due to underlying condition with other specified complication: Secondary | ICD-10-CM | POA: Diagnosis not present

## 2017-01-25 DIAGNOSIS — N390 Urinary tract infection, site not specified: Secondary | ICD-10-CM | POA: Diagnosis not present

## 2017-01-25 DIAGNOSIS — I6980 Unspecified sequelae of other cerebrovascular disease: Secondary | ICD-10-CM | POA: Diagnosis not present

## 2017-01-27 DIAGNOSIS — E0869 Diabetes mellitus due to underlying condition with other specified complication: Secondary | ICD-10-CM | POA: Diagnosis not present

## 2017-01-27 DIAGNOSIS — R296 Repeated falls: Secondary | ICD-10-CM | POA: Diagnosis not present

## 2017-01-27 DIAGNOSIS — I6980 Unspecified sequelae of other cerebrovascular disease: Secondary | ICD-10-CM | POA: Diagnosis not present

## 2017-01-27 DIAGNOSIS — E785 Hyperlipidemia, unspecified: Secondary | ICD-10-CM | POA: Diagnosis not present

## 2017-01-27 DIAGNOSIS — N390 Urinary tract infection, site not specified: Secondary | ICD-10-CM | POA: Diagnosis not present

## 2017-01-27 DIAGNOSIS — G0430 Acute necrotizing hemorrhagic encephalopathy, unspecified: Secondary | ICD-10-CM | POA: Diagnosis not present

## 2017-01-30 DIAGNOSIS — E785 Hyperlipidemia, unspecified: Secondary | ICD-10-CM | POA: Diagnosis not present

## 2017-01-30 DIAGNOSIS — G0430 Acute necrotizing hemorrhagic encephalopathy, unspecified: Secondary | ICD-10-CM | POA: Diagnosis not present

## 2017-01-30 DIAGNOSIS — R296 Repeated falls: Secondary | ICD-10-CM | POA: Diagnosis not present

## 2017-01-30 DIAGNOSIS — I6980 Unspecified sequelae of other cerebrovascular disease: Secondary | ICD-10-CM | POA: Diagnosis not present

## 2017-01-30 DIAGNOSIS — N390 Urinary tract infection, site not specified: Secondary | ICD-10-CM | POA: Diagnosis not present

## 2017-01-30 DIAGNOSIS — E0869 Diabetes mellitus due to underlying condition with other specified complication: Secondary | ICD-10-CM | POA: Diagnosis not present

## 2017-02-01 DIAGNOSIS — R296 Repeated falls: Secondary | ICD-10-CM | POA: Diagnosis not present

## 2017-02-01 DIAGNOSIS — E785 Hyperlipidemia, unspecified: Secondary | ICD-10-CM | POA: Diagnosis not present

## 2017-02-01 DIAGNOSIS — N390 Urinary tract infection, site not specified: Secondary | ICD-10-CM | POA: Diagnosis not present

## 2017-02-01 DIAGNOSIS — I6980 Unspecified sequelae of other cerebrovascular disease: Secondary | ICD-10-CM | POA: Diagnosis not present

## 2017-02-01 DIAGNOSIS — E0869 Diabetes mellitus due to underlying condition with other specified complication: Secondary | ICD-10-CM | POA: Diagnosis not present

## 2017-02-01 DIAGNOSIS — G0430 Acute necrotizing hemorrhagic encephalopathy, unspecified: Secondary | ICD-10-CM | POA: Diagnosis not present

## 2017-02-02 DIAGNOSIS — G0430 Acute necrotizing hemorrhagic encephalopathy, unspecified: Secondary | ICD-10-CM | POA: Diagnosis not present

## 2017-02-02 DIAGNOSIS — I6980 Unspecified sequelae of other cerebrovascular disease: Secondary | ICD-10-CM | POA: Diagnosis not present

## 2017-02-02 DIAGNOSIS — E785 Hyperlipidemia, unspecified: Secondary | ICD-10-CM | POA: Diagnosis not present

## 2017-02-02 DIAGNOSIS — E0869 Diabetes mellitus due to underlying condition with other specified complication: Secondary | ICD-10-CM | POA: Diagnosis not present

## 2017-02-02 DIAGNOSIS — R296 Repeated falls: Secondary | ICD-10-CM | POA: Diagnosis not present

## 2017-02-02 DIAGNOSIS — N390 Urinary tract infection, site not specified: Secondary | ICD-10-CM | POA: Diagnosis not present

## 2017-02-03 DIAGNOSIS — E0869 Diabetes mellitus due to underlying condition with other specified complication: Secondary | ICD-10-CM | POA: Diagnosis not present

## 2017-02-03 DIAGNOSIS — I6980 Unspecified sequelae of other cerebrovascular disease: Secondary | ICD-10-CM | POA: Diagnosis not present

## 2017-02-03 DIAGNOSIS — N390 Urinary tract infection, site not specified: Secondary | ICD-10-CM | POA: Diagnosis not present

## 2017-02-03 DIAGNOSIS — G0430 Acute necrotizing hemorrhagic encephalopathy, unspecified: Secondary | ICD-10-CM | POA: Diagnosis not present

## 2017-02-03 DIAGNOSIS — R296 Repeated falls: Secondary | ICD-10-CM | POA: Diagnosis not present

## 2017-02-03 DIAGNOSIS — E785 Hyperlipidemia, unspecified: Secondary | ICD-10-CM | POA: Diagnosis not present

## 2017-02-06 DIAGNOSIS — N390 Urinary tract infection, site not specified: Secondary | ICD-10-CM | POA: Diagnosis not present

## 2017-02-06 DIAGNOSIS — E0869 Diabetes mellitus due to underlying condition with other specified complication: Secondary | ICD-10-CM | POA: Diagnosis not present

## 2017-02-06 DIAGNOSIS — R296 Repeated falls: Secondary | ICD-10-CM | POA: Diagnosis not present

## 2017-02-06 DIAGNOSIS — E785 Hyperlipidemia, unspecified: Secondary | ICD-10-CM | POA: Diagnosis not present

## 2017-02-06 DIAGNOSIS — G0430 Acute necrotizing hemorrhagic encephalopathy, unspecified: Secondary | ICD-10-CM | POA: Diagnosis not present

## 2017-02-06 DIAGNOSIS — I6980 Unspecified sequelae of other cerebrovascular disease: Secondary | ICD-10-CM | POA: Diagnosis not present

## 2017-02-08 DIAGNOSIS — I6980 Unspecified sequelae of other cerebrovascular disease: Secondary | ICD-10-CM | POA: Diagnosis not present

## 2017-02-08 DIAGNOSIS — N390 Urinary tract infection, site not specified: Secondary | ICD-10-CM | POA: Diagnosis not present

## 2017-02-08 DIAGNOSIS — E0869 Diabetes mellitus due to underlying condition with other specified complication: Secondary | ICD-10-CM | POA: Diagnosis not present

## 2017-02-08 DIAGNOSIS — R296 Repeated falls: Secondary | ICD-10-CM | POA: Diagnosis not present

## 2017-02-08 DIAGNOSIS — E785 Hyperlipidemia, unspecified: Secondary | ICD-10-CM | POA: Diagnosis not present

## 2017-02-08 DIAGNOSIS — G0430 Acute necrotizing hemorrhagic encephalopathy, unspecified: Secondary | ICD-10-CM | POA: Diagnosis not present

## 2017-02-09 DIAGNOSIS — E785 Hyperlipidemia, unspecified: Secondary | ICD-10-CM | POA: Diagnosis not present

## 2017-02-09 DIAGNOSIS — N390 Urinary tract infection, site not specified: Secondary | ICD-10-CM | POA: Diagnosis not present

## 2017-02-09 DIAGNOSIS — I6980 Unspecified sequelae of other cerebrovascular disease: Secondary | ICD-10-CM | POA: Diagnosis not present

## 2017-02-09 DIAGNOSIS — R296 Repeated falls: Secondary | ICD-10-CM | POA: Diagnosis not present

## 2017-02-09 DIAGNOSIS — G0430 Acute necrotizing hemorrhagic encephalopathy, unspecified: Secondary | ICD-10-CM | POA: Diagnosis not present

## 2017-02-09 DIAGNOSIS — E0869 Diabetes mellitus due to underlying condition with other specified complication: Secondary | ICD-10-CM | POA: Diagnosis not present

## 2017-02-10 ENCOUNTER — Encounter (HOSPITAL_COMMUNITY): Payer: Self-pay | Admitting: Emergency Medicine

## 2017-02-10 ENCOUNTER — Emergency Department (HOSPITAL_COMMUNITY): Payer: Medicare Other

## 2017-02-10 ENCOUNTER — Inpatient Hospital Stay (HOSPITAL_COMMUNITY)
Admission: EM | Admit: 2017-02-10 | Discharge: 2017-02-14 | DRG: 637 | Disposition: A | Discharge status: Skilled Nursing Facility | Payer: Medicare Other | Attending: Internal Medicine | Admitting: Internal Medicine

## 2017-02-10 DIAGNOSIS — E11649 Type 2 diabetes mellitus with hypoglycemia without coma: Secondary | ICD-10-CM | POA: Diagnosis not present

## 2017-02-10 DIAGNOSIS — F419 Anxiety disorder, unspecified: Secondary | ICD-10-CM | POA: Diagnosis present

## 2017-02-10 DIAGNOSIS — Z794 Long term (current) use of insulin: Secondary | ICD-10-CM | POA: Diagnosis not present

## 2017-02-10 DIAGNOSIS — G9340 Encephalopathy, unspecified: Secondary | ICD-10-CM | POA: Diagnosis present

## 2017-02-10 DIAGNOSIS — E876 Hypokalemia: Secondary | ICD-10-CM | POA: Diagnosis present

## 2017-02-10 DIAGNOSIS — Z880 Allergy status to penicillin: Secondary | ICD-10-CM

## 2017-02-10 DIAGNOSIS — E162 Hypoglycemia, unspecified: Secondary | ICD-10-CM | POA: Diagnosis present

## 2017-02-10 DIAGNOSIS — Z66 Do not resuscitate: Secondary | ICD-10-CM | POA: Diagnosis present

## 2017-02-10 DIAGNOSIS — E43 Unspecified severe protein-calorie malnutrition: Secondary | ICD-10-CM | POA: Diagnosis present

## 2017-02-10 DIAGNOSIS — J90 Pleural effusion, not elsewhere classified: Secondary | ICD-10-CM | POA: Diagnosis not present

## 2017-02-10 DIAGNOSIS — D649 Anemia, unspecified: Secondary | ICD-10-CM | POA: Diagnosis present

## 2017-02-10 DIAGNOSIS — E160 Drug-induced hypoglycemia without coma: Secondary | ICD-10-CM | POA: Diagnosis present

## 2017-02-10 DIAGNOSIS — Z8249 Family history of ischemic heart disease and other diseases of the circulatory system: Secondary | ICD-10-CM

## 2017-02-10 DIAGNOSIS — I2510 Atherosclerotic heart disease of native coronary artery without angina pectoris: Secondary | ICD-10-CM | POA: Diagnosis not present

## 2017-02-10 DIAGNOSIS — J309 Allergic rhinitis, unspecified: Secondary | ICD-10-CM | POA: Diagnosis present

## 2017-02-10 DIAGNOSIS — Z681 Body mass index (BMI) 19 or less, adult: Secondary | ICD-10-CM

## 2017-02-10 DIAGNOSIS — I517 Cardiomegaly: Secondary | ICD-10-CM | POA: Diagnosis present

## 2017-02-10 DIAGNOSIS — E1165 Type 2 diabetes mellitus with hyperglycemia: Secondary | ICD-10-CM | POA: Diagnosis present

## 2017-02-10 DIAGNOSIS — I10 Essential (primary) hypertension: Secondary | ICD-10-CM | POA: Diagnosis not present

## 2017-02-10 DIAGNOSIS — Z905 Acquired absence of kidney: Secondary | ICD-10-CM

## 2017-02-10 DIAGNOSIS — I252 Old myocardial infarction: Secondary | ICD-10-CM

## 2017-02-10 DIAGNOSIS — T383X5A Adverse effect of insulin and oral hypoglycemic [antidiabetic] drugs, initial encounter: Secondary | ICD-10-CM | POA: Diagnosis not present

## 2017-02-10 DIAGNOSIS — F0390 Unspecified dementia without behavioral disturbance: Secondary | ICD-10-CM | POA: Diagnosis present

## 2017-02-10 DIAGNOSIS — Z955 Presence of coronary angioplasty implant and graft: Secondary | ICD-10-CM

## 2017-02-10 DIAGNOSIS — M810 Age-related osteoporosis without current pathological fracture: Secondary | ICD-10-CM | POA: Diagnosis present

## 2017-02-10 DIAGNOSIS — G9341 Metabolic encephalopathy: Secondary | ICD-10-CM | POA: Diagnosis not present

## 2017-02-10 DIAGNOSIS — R64 Cachexia: Secondary | ICD-10-CM | POA: Diagnosis present

## 2017-02-10 DIAGNOSIS — R5383 Other fatigue: Secondary | ICD-10-CM | POA: Diagnosis present

## 2017-02-10 DIAGNOSIS — R4182 Altered mental status, unspecified: Secondary | ICD-10-CM | POA: Diagnosis not present

## 2017-02-10 DIAGNOSIS — M353 Polymyalgia rheumatica: Secondary | ICD-10-CM | POA: Diagnosis present

## 2017-02-10 DIAGNOSIS — Z881 Allergy status to other antibiotic agents status: Secondary | ICD-10-CM

## 2017-02-10 DIAGNOSIS — Z79899 Other long term (current) drug therapy: Secondary | ICD-10-CM

## 2017-02-10 DIAGNOSIS — Z90710 Acquired absence of both cervix and uterus: Secondary | ICD-10-CM

## 2017-02-10 DIAGNOSIS — J9811 Atelectasis: Secondary | ICD-10-CM | POA: Diagnosis present

## 2017-02-10 DIAGNOSIS — I119 Hypertensive heart disease without heart failure: Secondary | ICD-10-CM | POA: Diagnosis present

## 2017-02-10 DIAGNOSIS — H9190 Unspecified hearing loss, unspecified ear: Secondary | ICD-10-CM | POA: Diagnosis present

## 2017-02-10 LAB — CBC
HEMATOCRIT: 29.2 % — AB (ref 36.0–46.0)
Hemoglobin: 9.2 g/dL — ABNORMAL LOW (ref 12.0–15.0)
MCH: 28.9 pg (ref 26.0–34.0)
MCHC: 31.5 g/dL (ref 30.0–36.0)
MCV: 91.8 fL (ref 78.0–100.0)
Platelets: 347 10*3/uL (ref 150–400)
RBC: 3.18 MIL/uL — ABNORMAL LOW (ref 3.87–5.11)
RDW: 14.7 % (ref 11.5–15.5)
WBC: 11 10*3/uL — AB (ref 4.0–10.5)

## 2017-02-10 LAB — COMPREHENSIVE METABOLIC PANEL
ALK PHOS: 91 U/L (ref 38–126)
ALT: 13 U/L — AB (ref 14–54)
ANION GAP: 10 (ref 5–15)
AST: 27 U/L (ref 15–41)
Albumin: 2.3 g/dL — ABNORMAL LOW (ref 3.5–5.0)
BILIRUBIN TOTAL: 0.4 mg/dL (ref 0.3–1.2)
BUN: 10 mg/dL (ref 6–20)
CALCIUM: 8.7 mg/dL — AB (ref 8.9–10.3)
CO2: 27 mmol/L (ref 22–32)
CREATININE: 0.66 mg/dL (ref 0.44–1.00)
Chloride: 103 mmol/L (ref 101–111)
GFR calc non Af Amer: >60 mL/min (ref >60)
Glucose, Bld: 56 mg/dL — ABNORMAL LOW (ref 65–99)
Potassium: 3.1 mmol/L — ABNORMAL LOW (ref 3.5–5.1)
SODIUM: 140 mmol/L (ref 135–145)
Total Protein: 5.6 g/dL — ABNORMAL LOW (ref 6.5–8.1)

## 2017-02-10 LAB — CBG MONITORING, ED
Glucose-Capillary: 103 mg/dL — ABNORMAL HIGH (ref 65–99)
Glucose-Capillary: 158 mg/dL — ABNORMAL HIGH (ref 65–99)

## 2017-02-10 MED ORDER — MAGNESIUM SULFATE IN D5W 1-5 GM/100ML-% IV SOLN
1.0000 g | Freq: Once | INTRAVENOUS | Status: AC
  Administered 2017-02-11: 1 g via INTRAVENOUS
  Filled 2017-02-10: qty 100

## 2017-02-10 MED ORDER — ATORVASTATIN CALCIUM 20 MG PO TABS
20.0000 mg | ORAL_TABLET | Freq: Every day | ORAL | Status: DC
  Administered 2017-02-11 – 2017-02-12 (×2): 20 mg via ORAL
  Filled 2017-02-10 (×2): qty 1

## 2017-02-10 MED ORDER — LORAZEPAM 0.5 MG PO TABS: 0.5000 mg | ORAL_TABLET | Freq: Four times a day (QID) | ORAL | Status: DC | PRN

## 2017-02-10 MED ORDER — BUSPIRONE HCL 5 MG PO TABS
10.0000 mg | ORAL_TABLET | Freq: Two times a day (BID) | ORAL | Status: DC
  Administered 2017-02-11 – 2017-02-13 (×6): 10 mg via ORAL
  Filled 2017-02-10 (×6): qty 2

## 2017-02-10 MED ORDER — LATANOPROST 0.005 % OP SOLN
1.0000 [drp] | Freq: Every day | OPHTHALMIC | Status: DC
  Administered 2017-02-11 – 2017-02-13 (×3): 1 [drp] via OPHTHALMIC
  Filled 2017-02-10: qty 2.5

## 2017-02-10 MED ORDER — POTASSIUM PHOSPHATES 15 MMOLE/5ML IV SOLN
30.0000 meq | Freq: Once | INTRAVENOUS | Status: AC
  Administered 2017-02-11: 30 meq via INTRAVENOUS
  Filled 2017-02-10: qty 6.82

## 2017-02-10 MED ORDER — LORAZEPAM 0.5 MG PO TABS
0.2500 mg | ORAL_TABLET | Freq: Two times a day (BID) | ORAL | Status: DC
  Administered 2017-02-11 – 2017-02-13 (×6): 0.25 mg via ORAL
  Filled 2017-02-10 (×7): qty 1

## 2017-02-10 MED ORDER — DEXTROSE-NACL 5-0.45 % IV SOLN
INTRAVENOUS | Status: AC
  Administered 2017-02-11: 02:00:00 via INTRAVENOUS

## 2017-02-10 MED ORDER — DEXTROSE 50 % IV SOLN: 1.0000 | Freq: Once | INTRAVENOUS | Status: DC

## 2017-02-10 MED ORDER — ONDANSETRON HCL 4 MG/2ML IJ SOLN: 4.0000 mg | Freq: Four times a day (QID) | INTRAMUSCULAR | Status: DC | PRN

## 2017-02-10 MED ORDER — VITAMIN D 1000 UNITS PO TABS
2000.0000 [IU] | ORAL_TABLET | Freq: Every day | ORAL | Status: DC
  Administered 2017-02-11 – 2017-02-13 (×3): 2000 [IU] via ORAL
  Filled 2017-02-10 (×5): qty 2

## 2017-02-10 MED ORDER — DEXTROSE 50 % IV SOLN
INTRAVENOUS | Status: AC
  Administered 2017-02-10: 50 mL
  Filled 2017-02-10: qty 50

## 2017-02-10 MED ORDER — METOPROLOL SUCCINATE ER 25 MG PO TB24
25.0000 mg | ORAL_TABLET | Freq: Every day | ORAL | Status: DC
  Administered 2017-02-11 – 2017-02-13 (×3): 25 mg via ORAL
  Filled 2017-02-10 (×4): qty 1

## 2017-02-10 MED ORDER — ONDANSETRON HCL 4 MG PO TABS: 4.0000 mg | ORAL_TABLET | Freq: Four times a day (QID) | ORAL | Status: DC | PRN

## 2017-02-10 MED ORDER — LOSARTAN POTASSIUM 50 MG PO TABS
50.0000 mg | ORAL_TABLET | Freq: Every day | ORAL | Status: DC
Start: 2017-02-11 — End: 2017-02-14
  Administered 2017-02-11 – 2017-02-13 (×3): 50 mg via ORAL
  Filled 2017-02-10 (×4): qty 1

## 2017-02-10 MED ORDER — HYDROCODONE-ACETAMINOPHEN 5-325 MG PO TABS: 1.0000 | ORAL_TABLET | Freq: Every day | ORAL | Status: DC | PRN

## 2017-02-10 MED ORDER — INSULIN ASPART 100 UNIT/ML ~~LOC~~ SOLN
0.0000 [IU] | SUBCUTANEOUS | Status: DC
  Administered 2017-02-11: 2 [IU] via SUBCUTANEOUS

## 2017-02-10 MED ORDER — ACETAMINOPHEN 500 MG PO TABS: 500.0000 mg | ORAL_TABLET | ORAL | Status: DC | PRN

## 2017-02-10 MED ORDER — DEXTROSE-NACL 5-0.45 % IV SOLN
INTRAVENOUS | Status: DC
  Administered 2017-02-10: via INTRAVENOUS

## 2017-02-10 MED ORDER — ENOXAPARIN SODIUM 40 MG/0.4ML ~~LOC~~ SOLN
40.0000 mg | SUBCUTANEOUS | Status: DC
  Administered 2017-02-11 – 2017-02-14 (×4): 40 mg via SUBCUTANEOUS
  Filled 2017-02-10 (×4): qty 0.4

## 2017-02-10 MED ORDER — AZELASTINE HCL 0.1 % NA SOLN
2.0000 | Freq: Two times a day (BID) | NASAL | Status: DC
  Administered 2017-02-11 – 2017-02-14 (×6): 2 via NASAL
  Filled 2017-02-10: qty 30

## 2017-02-10 NOTE — ED Triage Notes (Signed)
Per EMS, pt from [facility], pt LKW 1700, pt found by staff to be unresponsive, EMS CBG-12, given 1 amp D50, CBG up to 316. Per staff pt received 6 units novolog prior to dinner, did not eat much. CBG upon arrival to this ED 102. Pt HOH. EMS vitals: BP-142/66, RR-16, SpO2-98% 2L

## 2017-02-10 NOTE — ED Notes (Signed)
Delay in lab draw,  Pt not in room 

## 2017-02-10 NOTE — ED Notes (Signed)
This RN in room to start D5 1/2 drip, line in left forearm unable to flush and pt screamed "ouch my arm, that hurts" This is first words pt has spoken since she's been here. Pt moving all extremities. No responding to any questions. Pt just stares. VSS. Line in left arm removed.

## 2017-02-10 NOTE — ED Notes (Signed)
Patient transported to CT 

## 2017-02-10 NOTE — ED Provider Notes (Signed)
[facility] DEPT Provider Note   CSN: [id number] Arrival date & time: 02/10/17  2036     History   Chief Complaint Chief Complaint  Patient presents with  . Hypoglycemia    HPI [name] is a 83 y.o. female.  The history is provided by the EMS personnel and the nursing home.    Patient is a 83 year old female with past medical history significant for CAD, DM, HTN, tracheal arteritis, who presents to the emergency department from SNF for altered mental status. Per report patient was at her mental status baseline earlier today. Patient went to the dining hall and then got 6 units NovoLog insulin at 4:30, ate very little at dinner. Couple of hours later the patient was found with altered mental status and snoring respirations. Glucose at that time was 12. Patient received D50, glucose improved to 300s the patient continued to have altered mental status. Denies recent falls or trauma. Per the patient's nursing facility she is normally talking, combative and angry. States that it is rare for her to not talk at all. Denies recent cough, fever, emesis.  Past Medical History:  Diagnosis Date  . BPPV (benign paroxysmal positional vertigo)   . CAD (coronary artery disease)    a. Inferior STEMI s/p BMSx2 to RCA. Coronary anomaly with single coronary ostium arising from right sinus of Valsalva and likely LM course between PA and aorta.  . Carpal tunnel syndrome 08/04/2009  . Diabetes mellitus ([place])   . Essential hypertension   . [place] 03/26/2010  . INCONTINENCE, STRESS, FEMALE 10/12/2006  . Inferior MI ([place]) 04/21/2011  . 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 H/o Left eye hemianopsia (longstanding).  . Kidney stone   . Multinodular goiter   . Osteoporosis, unspecified 10/12/2006  . RHINITIS, ALLERGIC 10/12/2006  . Single kidney    a. Absence of right kidney by prior imaging.  . TEMPORAL ARTERITIS 10/12/2006  . UNSTEADY GAIT 01/22/2007    Patient Active Problem List   Diagnosis Date Noted  . Hypokalemia 02/10/2017  . Lethargy 02/10/2017  . Normocytic anemia 02/10/2017  . Hypoglycemia due to insulin 02/10/2017  . Hypoglycemia 02/10/2017  . Agitated 01/19/2017  . UTI (urinary tract infection) 11/30/2016  . Diabetes mellitus with complication ([place]) 75/05/2584  . Acute lower UTI 11/29/2016  . Acute confusion due to infection   . Acute cystitis without hematuria   . Recurrent falls 10/05/2015  . Acute encephalopathy 09/24/2015  . Fever, myalgia, and generalized weakness 09/24/2015  . Hyperlipidemia 06/02/2015  . PMR (polymyalgia rheumatica) ([facility]) 12/17/2014  . Vertigo 10/12/2014  . Essential hypertension   . Single kidney   . Poorly controlled type 2 diabetes mellitus ([place]) 02/26/2013  . CAD (coronary artery disease) 04/21/2011  . [place] 03/26/2010  . CARPAL TUNNEL SYNDROME 08/04/2009  . UNSTEADY GAIT 01/22/2007  . TEMPORAL ARTERITIS 10/12/2006  . RHINITIS, ALLERGIC 10/12/2006  . OSTEOPOROSIS, UNSPECIFIED 10/12/2006    Past Surgical History:  Procedure Laterality Date  . ABDOMINAL HYSTERECTOMY    . CATARACT EXTRACTION, BILATERAL    . CORONARY ANGIOPLASTY      OB History    No data available       Home Medications    Prior to Admission medications   Medication Sig Start Date End Date Taking? Authorizing Provider  acetaminophen (TYLENOL) 500 MG tablet Take 500 mg by mouth every 4 (four) hours as needed (for headache, doscomfort, or fever of 99.5-101 F).    Yes  [provider]  atorvastatin (LIPITOR) 20 MG tablet TAKE 1 TABLET (20 MG TOTAL) BY MOUTH DAILY. 11/18/16  Yes Burchette, Alinda Sierras, MD  azelastine (ASTELIN) 0.1 % nasal spray USE 2 SPRAYS IN EACH NOSTRILTWICE DAILY 10/03/16  Yes Burchette, Alinda Sierras, MD  busPIRone (BUSPAR) 10 MG tablet Take 10 mg by mouth 2 (two) times daily.   Yes [provider]  Cholecalciferol (VITAMIN D) 2000 UNITS CAPS Take 2,000 Units by mouth daily.   Yes [provider]    HYDROcodone-acetaminophen (NORCO/VICODIN) 5-325 MG tablet Take 1 tablet by mouth daily as needed (for pain).    Yes [provider]  insulin aspart (NOVOLOG) 100 UNIT/ML injection Inject 0-15 Units into the skin 3 (three) times daily with meals. For glucose 150-200 give 2 units, 201-250 give 4 units, 251-300 give 6 units, 301-350 give 8 units, 351 or greater give 10 units. 01/21/17  Yes Arrien, Jimmy Picket, MD  loperamide (IMODIUM) 2 MG capsule Take 2 mg by mouth See admin instructions. With each loose stool as needed for diarrhea (max 8 doses/24 hours)   Yes [provider]  LORazepam (ATIVAN) 0.5 MG tablet Take 0.25 mg by mouth 2 (two) times daily.   Yes [provider]  LORazepam (ATIVAN) 0.5 MG tablet Take 0.5 mg by mouth every 6 (six) hours as needed for anxiety.   Yes [provider]  losartan (COZAAR) 50 MG tablet TAKE 1 TABLET BY MOUTH EVERY DAY 11/07/16  Yes Burchette, Alinda Sierras, MD  metFORMIN (GLUCOPHAGE-XR) 750 MG 24 hr tablet TAKE 1 TABLET DAILY WITH BREAKFAST 09/02/16  Yes Burchette, Alinda Sierras, MD  metoprolol succinate (TOPROL-XL) 25 MG 24 hr tablet TAKE 1 TABLET (25 MG TOTAL) BY MOUTH DAILY. 07/04/16  Yes Burchette, Alinda Sierras, MD  nitroGLYCERIN (NITROSTAT) 0.4 MG SL tablet Place 0.4 mg under the tongue every 5 (five) minutes as needed for chest pain.    Yes [provider]  TRAVATAN Z 0.004 % SOLN ophthalmic solution Place 1 drop into both eyes at bedtime.    Yes [provider]  triamcinolone cream (KENALOG) 0.1 % Apply 1 application topically 2 (two) times daily as needed (for rash).   Yes [provider]  blood glucose meter kit and supplies Dispense based on patient and insurance preference. Use up to four times daily as directed. (FOR ICD-9 250.00, 250.01). Please check sugars before meals and at night. Keep a log. 01/21/17   Arrien, Jimmy Picket, MD  Insulin Pen Needle (NOVOFINE) 30G X 8 MM MISC Inject 10 each into the skin  as needed. 01/21/17   Arrien, Jimmy Picket, MD  methotrexate ([place]) 2.5 MG tablet TAKE 2 TABLETS BY MOUTH ONCE A WEEK Patient not taking: Reported on 02/10/2017 09/02/16   Eulas Post, MD    Family History Family History  Problem Relation Age of Onset  . Heart attack Father   . Stroke Mother     Social History Social History  Substance Use Topics  . Smoking status: Never Smoker  . Smokeless tobacco: Never Used  . Alcohol use No     Allergies   Penicillins and Sulfadiazine   Review of Systems Review of Systems  Unable to perform ROS: Mental status change     Physical Exam Updated Vital Signs BP (!) 111/45   Pulse 68   Resp 16   SpO2 100%   Physical Exam  Constitutional:  Cachectic  HENT:  Head: Atraumatic.  Eyes: EOM are normal. Pupils are  equal, round, and reactive to light.  Neck: Normal range of motion. Neck supple.  Cardiovascular: Normal rate, regular rhythm, normal heart sounds and intact distal pulses.   No murmur heard. Pulmonary/Chest: Effort normal and breath sounds normal. No respiratory distress.  Abdominal: She exhibits no distension and no mass. There is no tenderness. There is no guarding.  Musculoskeletal: Normal range of motion. She exhibits no edema.  No unilateral leg swelling  Neurological: She is alert.  Patient does not answer any questions, will not state her name. Patient unable to follow simple commands. Moving All extremities.  Skin: Skin is warm. No pallor.  Skin tear to the right lower leg with surrounding ecchymosis, appears old     ED Treatments / Results  Labs (all labs ordered are listed, but only abnormal results are displayed) Labs Reviewed  COMPREHENSIVE METABOLIC PANEL - Abnormal; Notable for the following:       Result Value   Potassium 3.1 (*)    Glucose, Bld 56 (*)    Calcium 8.7 (*)    Total Protein 5.6 (*)    Albumin 2.3 (*)    ALT 13 (*)    All other components within normal limits  CBC -  Abnormal; Notable for the following:    WBC 11.0 (*)    RBC 3.18 (*)    Hemoglobin 9.2 (*)    HCT 29.2 (*)    All other components within normal limits  CBG MONITORING, ED - Abnormal; Notable for the following:    Glucose-Capillary 103 (*)    All other components within normal limits  CBG MONITORING, ED - Abnormal; Notable for the following:    Glucose-Capillary 158 (*)    All other components within normal limits  URINALYSIS, ROUTINE W REFLEX MICROSCOPIC  TROPONIN I    EKG  EKG Interpretation  Date/Time:  Friday February 10 2017 20:42:39 EDT Ventricular Rate:  79 PR Interval:    QRS Duration: 96 QT Interval:  408 QTC Calculation: 468 R Axis:   34 Text Interpretation:  Sinus rhythm Borderline T abnormalities, inferior leads Artifact Abnormal ekg Confirmed by Carmin Muskrat [phone]) on 02/10/2017 9:06:49 PM       Radiology Dg Chest 2 View  Result Date: 02/10/2017 CLINICAL DATA:  Altered mental status EXAM: CHEST  2 VIEW COMPARISON:  01/19/2017, 11/29/2016 FINDINGS: Small left-sided pleural effusion. Hazy atelectasis or infiltrate at the left lung base. Mild cardiomegaly with atherosclerosis. No pneumothorax. Deformity of the anterior tests well as before. IMPRESSION: 1. Small left pleural effusion with hazy left basilar atelectasis or infiltrate 2. Mild cardiomegaly Electronically Signed   By: Donavan Foil M.D.   On: 02/10/2017 21:56   Ct Head Wo Contrast  Result Date: 02/10/2017 CLINICAL DATA:  Altered mental status. History of diabetes, temporal arteritis/ polymyalgia rheumatica. EXAM: CT HEAD WITHOUT CONTRAST TECHNIQUE: Contiguous axial images were obtained from the base of the skull through the vertex without intravenous contrast. COMPARISON:  CT HEAD January 19, 2017 FINDINGS: BRAIN: No intraparenchymal hemorrhage, mass effect nor midline shift. The ventricles and sulci are normal for age though, there is mild sulcal effacement at the convexities and narrowed callosal angle. Patchy  supratentorial white matter hypodensities within normal range for patient's age, though non-specific are most compatible with chronic small vessel ischemic disease. No acute large vascular territory infarcts. No abnormal extra-axial fluid collections. Re- demonstration of partially calcified 2 cm RIGHT frontal meningioma without mass effect. Dural calcifications noted. Basal cisterns are patent. VASCULAR: Moderate calcific atherosclerosis  of the carotid siphons. SKULL: No skull fracture. Moderate RIGHT severe LEFT temporomandibular osteoarthrosis. No significant scalp soft tissue swelling. SINUSES/ORBITS: Mild paranasal sinus mucosal thickening. The included ocular globes and orbital contents are non-suspicious. Status post bilateral ocular lens implants. OTHER: None. IMPRESSION: 1. No acute intracranial process. 2. Stable involutional changes with a component of suspected normal pressure hydrocephalus. Mild-to-moderate chronic small vessel ischemic disease. 3. Stable 2 cm RIGHT frontal meningioma without mass effect. Electronically Signed   By: Elon Alas M.D.   On: 02/10/2017 21:45    Procedures Procedures (including critical care time)  Medications Ordered in ED Medications  dextrose 5 %-0.45 % sodium chloride infusion ( Intravenous New Bag/Given 02/10/17 2337)  potassium phosphate 30 mEq in dextrose 5 % 500 mL infusion (not administered)  magnesium sulfate IVPB 1 g 100 mL (not administered)  dextrose 50 % solution (50 mLs  Given 02/10/17 2204)     Initial Impression / Assessment and Plan / ED Course  I have reviewed the triage vital signs and the nursing notes.  Pertinent labs & imaging results that were available during my care of the patient were reviewed by me and considered in my medical decision making (see chart for details).     83 year old with past medical history significant for diabetes, who presents to the emergency department with altered mental status. Received 6 units  NovoLog at 1630, glucose checked later was 12. Patient's mental status did not improve with correction of her hypoglycemia. No infectious symptoms according to her nursing facility.  Lab work remarkable for leukocyte esterase of 11. Hemoglobin stable. Glucose 52. Chest x-ray showed no signs of pneumonia. CT head showed no acute findings. UA pending.   Pt most likely with metabolic encephalopathy in the setting of hypoglycemia. Started on D5 1/2 NS infusion.   Pt admitted to Hospitalist, Dr. Myna Hidalgo. Pt stable for the floor. Pt does have an active [name].   Final Clinical Impressions(s) / ED Diagnoses   Final diagnoses:  Hypoglycemia  Altered mental status, unspecified altered mental status type    New Prescriptions New Prescriptions   No medications on file     Nathaniel Man, MD 02/10/17 2338    Carmin Muskrat, MD 02/12/17 [phone]

## 2017-02-10 NOTE — H&P (Signed)
History and Physical    [name] DQQ:[id number] DOB: 04-03-1923 DOA: 02/10/2017  PCP: Eulas Post, MD   Patient coming from: SNF  Chief Complaint: Found unresponsive   HPI: [name] is a 83 y.o. female with medical history significant for type 2 diabetes mellitus, hypertension, CAD, and chronic normocytic anemia, now presenting to the emergency department from her SNF after being found unresponsive by staff. Per the report of SNF personnel, patient was in her usual state around 5 PM, was given 6 units of NovoLog per her sliding scale, did not eat much of her dinner, and retired to her quarters where she was found unresponsive just prior to arrival. EMS was called out, found CBG to [name], administered one ampule of D50%, and transported the patient to the hospital. She was started on insulin in addition to her metformin 2 weeks ago, but had seemed to tolerate it without incident until today. She seemed to be having an uneventful day leading up to this with no recent fall or trauma reported and no fevers documented. Patient has not expressed any complaints.  ED Course: Upon arrival to the ED, patient is found to be saturating adequately on room air, and with vitals otherwise normal. EKG features a sinus rhythm and chest x-ray is notable for a small left pleural effusion with left basilar atelectasis versus consolidation. Noncontrast head CT is negative for acute intracranial abnormality, but notable for stable changes that are likely secondary to NPH, as well as a stable meningioma without mass effect. Chemistry panel reveals a serum glucose of 56, potassium 3.1, and albumin of 2.3. CBC is notable for a slight leukocytosis to 11,000 and a stable normocytic anemia with hemoglobin of 9.2. Patient was started on 5% dextrose in half-normal saline. She remained hemodynamically stable and in no apparent distress and will be observed on the medical-surgical unit for ongoing evaluation and  management of hypoglycemia, likely secondary to insulin, with acute encephalopathy.  Review of Systems:  Unable to obtain secondary to the clinical condition with acute encephalopathy.  Past Medical History:  Diagnosis Date  . BPPV (benign paroxysmal positional vertigo)   . CAD (coronary artery disease)    a. Inferior STEMI s/p BMSx2 to RCA. Coronary anomaly with single coronary ostium arising from right sinus of Valsalva and likely LM course between PA and aorta.  . Carpal tunnel syndrome 08/04/2009  . Diabetes mellitus ([place])   . Essential hypertension   . [place], [place] 03/26/2010  . INCONTINENCE, STRESS, FEMALE 10/12/2006  . Inferior MI ([place]) 04/21/2011  . 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 H/o Left eye hemianopsia (longstanding).  . Kidney stone   . Multinodular goiter   . Osteoporosis, unspecified 10/12/2006  . RHINITIS, ALLERGIC 10/12/2006  . Single kidney    a. Absence of right kidney by prior imaging.  . TEMPORAL ARTERITIS 10/12/2006  . UNSTEADY GAIT 01/22/2007    Past Surgical History:  Procedure Laterality Date  . ABDOMINAL HYSTERECTOMY    . CATARACT EXTRACTION, BILATERAL    . CORONARY ANGIOPLASTY       reports that she has never smoked. She has never used smokeless tobacco. She reports that she does not drink alcohol or use drugs.  Allergies  Allergen Reactions  . Penicillins Hives and Other (See Comments)    Has patient had a PCN reaction causing immediate rash, facial/tongue/throat swelling, SOB or lightheadedness with hypotension: Yes Has patient had a PCN reaction causing severe rash involving  mucus membranes or skin necrosis: Unk Has patient had a PCN reaction that required hospitalization: Unk Has patient had a PCN reaction occurring within the last 10 years: Unk If all of the above answers are "NO", then may proceed with Cephalosporin use.  . Sulfadiazine Hives    Family History  Problem Relation Age of Onset  . Heart attack Father   . Stroke  Mother      Prior to Admission medications   Medication Sig Start Date End Date Taking? Authorizing Provider  acetaminophen (TYLENOL) 500 MG tablet Take 500 mg by mouth every 4 (four) hours as needed (for headache, doscomfort, or fever of 99.5-101 F).    Yes [provider]  atorvastatin (LIPITOR) 20 MG tablet TAKE 1 TABLET (20 MG TOTAL) BY MOUTH DAILY. 11/18/16  Yes Burchette, Alinda Sierras, MD  azelastine (ASTELIN) 0.1 % nasal spray USE 2 SPRAYS IN EACH NOSTRILTWICE DAILY 10/03/16  Yes Burchette, Alinda Sierras, MD  busPIRone (BUSPAR) 10 MG tablet Take 10 mg by mouth 2 (two) times daily.   Yes [provider]  Cholecalciferol (VITAMIN D) 2000 UNITS CAPS Take 2,000 Units by mouth daily.   Yes [provider]  HYDROcodone-acetaminophen (NORCO/VICODIN) 5-325 MG tablet Take 1 tablet by mouth daily as needed (for pain).    Yes [provider]  insulin aspart (NOVOLOG) 100 UNIT/ML injection Inject 0-15 Units into the skin 3 (three) times daily with meals. For glucose 150-200 give 2 units, 201-250 give 4 units, 251-300 give 6 units, 301-350 give 8 units, 351 or greater give 10 units. 01/21/17  Yes Arrien, Jimmy Picket, MD  loperamide (IMODIUM) 2 MG capsule Take 2 mg by mouth See admin instructions. With each loose stool as needed for diarrhea (max 8 doses/24 hours)   Yes [provider]  LORazepam (ATIVAN) 0.5 MG tablet Take 0.25 mg by mouth 2 (two) times daily.   Yes [provider]  LORazepam (ATIVAN) 0.5 MG tablet Take 0.5 mg by mouth every 6 (six) hours as needed for anxiety.   Yes [provider]  losartan (COZAAR) 50 MG tablet TAKE 1 TABLET BY MOUTH EVERY DAY 11/07/16  Yes Burchette, Alinda Sierras, MD  metFORMIN (GLUCOPHAGE-XR) 750 MG 24 hr tablet TAKE 1 TABLET DAILY WITH BREAKFAST 09/02/16  Yes Burchette, Alinda Sierras, MD  metoprolol succinate (TOPROL-XL) 25 MG 24 hr tablet TAKE 1 TABLET (25 MG TOTAL) BY MOUTH DAILY. 07/04/16  Yes Burchette, Alinda Sierras, MD    nitroGLYCERIN (NITROSTAT) 0.4 MG SL tablet Place 0.4 mg under the tongue every 5 (five) minutes as needed for chest pain.    Yes [provider]  TRAVATAN Z 0.004 % SOLN ophthalmic solution Place 1 drop into both eyes at bedtime.    Yes [provider]  triamcinolone cream (KENALOG) 0.1 % Apply 1 application topically 2 (two) times daily as needed (for rash).   Yes [provider]  blood glucose meter kit and supplies Dispense based on patient and insurance preference. Use up to four times daily as directed. (FOR ICD-9 250.00, 250.01). Please check sugars before meals and at night. Keep a log. 01/21/17   Arrien, Jimmy Picket, MD  Insulin Pen Needle (NOVOFINE) 30G X 8 MM MISC Inject 10 each into the skin as needed. 01/21/17   Arrien, Jimmy Picket, MD  methotrexate (RHEUMATREX) 2.5 MG tablet TAKE 2 TABLETS BY MOUTH ONCE A WEEK Patient not taking: Reported on 02/10/2017 09/02/16   Eulas Post, MD    Physical Exam:  Vitals:   02/10/17 2200 02/10/17 2230 02/10/17 2300 02/10/17 2315  BP: (!) 130/54 (!) 103/54 (!) 120/48 (!) 111/45  Pulse: 75 70 68 68  Resp: 15 15 15 16   SpO2: 100% 100% 100% 100%      Constitutional: No acute distress, calm, no diaphoresis Eyes: PERTLA, lids and conjunctivae normal ENMT: Mucous membranes are moist. Posterior pharynx clear of any exudate or lesions.   Neck: normal, supple, no masses, no thyromegaly Respiratory: clear to auscultation bilaterally, no wheezing, no crackles. Normal respiratory effort.   Cardiovascular: S1 & S2 heard, regular rate and rhythm. No extremity edema. No significant JVD. Abdomen: No distension, no tenderness, soft. Bowel sounds active.  Musculoskeletal: no clubbing / cyanosis. No joint deformity upper and lower extremities.   Skin: no significant rashes, lesions, ulcers. Warm, dry, well-perfused. Neurologic: CN 2-12 grossly intact. Patellar DTR's normal.    Psychiatric: Making eye-contact, appears  alert, but no verbal responses.      Labs on Admission: I have personally reviewed following labs and imaging studies  CBC:  Recent Labs Lab 02/10/17 2152  WBC 11.0*  HGB 9.2*  HCT 29.2*  MCV 91.8  PLT 161   Basic Metabolic Panel:  Recent Labs Lab 02/10/17 2152  NA 140  K 3.1*  CL 103  CO2 27  GLUCOSE 56*  BUN 10  CREATININE 0.66  CALCIUM 8.7*   GFR: CrCl cannot be calculated (Unknown ideal weight.). Liver Function Tests:  Recent Labs Lab 02/10/17 2152  AST 27  ALT 13*  ALKPHOS 91  BILITOT 0.4  PROT 5.6*  ALBUMIN 2.3*   No results for input(s): LIPASE, AMYLASE in the last 168 hours. No results for input(s): AMMONIA in the last 168 hours. Coagulation Profile: No results for input(s): INR, PROTIME in the last 168 hours. Cardiac Enzymes: No results for input(s): CKTOTAL, CKMB, CKMBINDEX, TROPONINI in the last 168 hours. BNP (last 3 results) No results for input(s): PROBNP in the last 8760 hours. HbA1C: No results for input(s): HGBA1C in the last 72 hours. CBG:  Recent Labs Lab 02/10/17 2036 02/10/17 2247  GLUCAP 103* 158*   Lipid Profile: No results for input(s): CHOL, HDL, LDLCALC, TRIG, CHOLHDL, LDLDIRECT in the last 72 hours. Thyroid Function Tests: No results for input(s): TSH, T4TOTAL, FREET4, T3FREE, THYROIDAB in the last 72 hours. Anemia Panel: No results for input(s): VITAMINB12, FOLATE, FERRITIN, TIBC, IRON, RETICCTPCT in the last 72 hours. Urine analysis:    Component Value Date/Time   COLORURINE YELLOW 01/19/2017 1723   APPEARANCEUR HAZY (A) 01/19/2017 1723   LABSPEC 1.027 01/19/2017 1723   PHURINE 5.0 01/19/2017 1723   GLUCOSEU >=500 (A) 01/19/2017 1723   HGBUR NEGATIVE 01/19/2017 1723   BILIRUBINUR NEGATIVE 01/19/2017 1723   BILIRUBINUR neg 12/03/2013 1135   KETONESUR NEGATIVE 01/19/2017 1723   PROTEINUR 30 (A) 01/19/2017 1723   UROBILINOGEN 0.2 12/03/2013 1135   UROBILINOGEN 1.0 11/14/2013 1325   NITRITE NEGATIVE  01/19/2017 1723   LEUKOCYTESUR NEGATIVE 01/19/2017 1723   Sepsis Labs: @LABRCNTIP (procalcitonin:4,lacticidven:4) )No results found for this or any previous visit (from the past 240 hour(s)).   Radiological Exams on Admission: Dg Chest 2 View  Result Date: 02/10/2017 CLINICAL DATA:  Altered mental status EXAM: CHEST  2 VIEW COMPARISON:  01/19/2017, 11/29/2016 FINDINGS: Small left-sided pleural effusion. Hazy atelectasis or infiltrate at the left lung base. Mild cardiomegaly with atherosclerosis. No pneumothorax. Deformity of the anterior tests well as before. IMPRESSION: 1. Small left pleural effusion with hazy left basilar atelectasis or  infiltrate 2. Mild cardiomegaly Electronically Signed   By: Donavan Foil M.D.   On: 02/10/2017 21:56   Ct Head Wo Contrast  Result Date: 02/10/2017 CLINICAL DATA:  Altered mental status. History of diabetes, temporal arteritis/ polymyalgia rheumatica. EXAM: CT HEAD WITHOUT CONTRAST TECHNIQUE: Contiguous axial images were obtained from the base of the skull through the vertex without intravenous contrast. COMPARISON:  CT HEAD January 19, 2017 FINDINGS: BRAIN: No intraparenchymal hemorrhage, mass effect nor midline shift. The ventricles and sulci are normal for age though, there is mild sulcal effacement at the convexities and narrowed callosal angle. Patchy supratentorial white matter hypodensities within normal range for patient's age, though non-specific are most compatible with chronic small vessel ischemic disease. No acute large vascular territory infarcts. No abnormal extra-axial fluid collections. Re- demonstration of partially calcified 2 cm RIGHT frontal meningioma without mass effect. Dural calcifications noted. Basal cisterns are patent. VASCULAR: Moderate calcific atherosclerosis of the carotid siphons. SKULL: No skull fracture. Moderate RIGHT severe LEFT temporomandibular osteoarthrosis. No significant scalp soft tissue swelling. SINUSES/ORBITS: Mild paranasal  sinus mucosal thickening. The included ocular globes and orbital contents are non-suspicious. Status post bilateral ocular lens implants. OTHER: None. IMPRESSION: 1. No acute intracranial process. 2. Stable involutional changes with a component of suspected normal pressure hydrocephalus. Mild-to-moderate chronic small vessel ischemic disease. 3. Stable 2 cm RIGHT frontal meningioma without mass effect. Electronically Signed   By: Elon Alas M.D.   On: 02/10/2017 21:45    EKG: Independently reviewed. Sinus rhythm, inferior Q-wave.   Assessment/Plan  1. Hypoglycemia, type II DM  - Pt has type II DM managed at the SNF with metformin, plus Novlog sliding-scale that was added 2 weeks ago  - A1c was 8.3% in February 2018  - She received 6 units Novolog before dinner, did not eat much, and was later found unresponsive with CBG 12  - CBG 103 on arrival to ED after an amp of D-50% given by EMS, but fell to 56 by time chem panel drawn in ED  - She was started on dextrose 5% in 1/2 NS in ED  - No acute illness identified as etiology for this, will check a troponin  - Continue dextrose-containing fluids for now with frequent CBG's, update A1c, discontinue dextrose fluids as tolerated    2. Acute encephalopathy  - Likely secondary to hypoglycemia  - Head CT negative for acute pathology and no focal deficit elicited on exam  - Anticipate improvement with treatment of #1    3. Hypokalemia  - Serum potassium is 3.1 on admission without EKG changes, possibly secondary to excess insulin  - Treated with 30 mEq IV potassium  - Repeat chemistry panel in am     4. Normocytic anemia  - Hgb is 9.2 on admission - Stable relative to priors in 9-10 range, and with no bleeding evident   5. CAD - EKG without acute changes, no complaints from patient  - Checking troponin to exclude MI as etiology behind hypoglycemia  - Continue Toprol, losartan, and Lipitor as tolerated    6. Hypertension - BP at goal   - Continue losartan and Toprol as tolerated   7. Anxiety  - Continue home regimen with Buspar and Ativan    DVT prophylaxis: sq Lovenox  Code Status: [name] Family Communication: Discussed with patient Disposition Plan: Observe on med-surg Consults called: None Admission status: Observation    Vianne Bulls, MD [facility] Pager [phone]  If 7PM-7AM, please contact night-coverage [email or web address] Password  [email or web address]  02/10/2017, 11:59 PM

## 2017-02-10 NOTE — ED Notes (Signed)
CBG 69

## 2017-02-11 DIAGNOSIS — I252 Old myocardial infarction: Secondary | ICD-10-CM | POA: Diagnosis not present

## 2017-02-11 DIAGNOSIS — R64 Cachexia: Secondary | ICD-10-CM | POA: Diagnosis present

## 2017-02-11 DIAGNOSIS — Q600 Renal agenesis, unilateral: Secondary | ICD-10-CM | POA: Diagnosis not present

## 2017-02-11 DIAGNOSIS — M722 Plantar fascial fibromatosis: Secondary | ICD-10-CM | POA: Diagnosis not present

## 2017-02-11 DIAGNOSIS — Z905 Acquired absence of kidney: Secondary | ICD-10-CM | POA: Diagnosis not present

## 2017-02-11 DIAGNOSIS — H818X9 Other disorders of vestibular function, unspecified ear: Secondary | ICD-10-CM | POA: Diagnosis not present

## 2017-02-11 DIAGNOSIS — D649 Anemia, unspecified: Secondary | ICD-10-CM | POA: Diagnosis not present

## 2017-02-11 DIAGNOSIS — E11649 Type 2 diabetes mellitus with hypoglycemia without coma: Secondary | ICD-10-CM | POA: Diagnosis present

## 2017-02-11 DIAGNOSIS — I517 Cardiomegaly: Secondary | ICD-10-CM | POA: Diagnosis present

## 2017-02-11 DIAGNOSIS — J9811 Atelectasis: Secondary | ICD-10-CM | POA: Diagnosis present

## 2017-02-11 DIAGNOSIS — G9340 Encephalopathy, unspecified: Secondary | ICD-10-CM | POA: Diagnosis not present

## 2017-02-11 DIAGNOSIS — E162 Hypoglycemia, unspecified: Secondary | ICD-10-CM

## 2017-02-11 DIAGNOSIS — K5900 Constipation, unspecified: Secondary | ICD-10-CM | POA: Diagnosis not present

## 2017-02-11 DIAGNOSIS — I6980 Unspecified sequelae of other cerebrovascular disease: Secondary | ICD-10-CM | POA: Diagnosis not present

## 2017-02-11 DIAGNOSIS — T383X5A Adverse effect of insulin and oral hypoglycemic [antidiabetic] drugs, initial encounter: Secondary | ICD-10-CM | POA: Diagnosis not present

## 2017-02-11 DIAGNOSIS — N390 Urinary tract infection, site not specified: Secondary | ICD-10-CM | POA: Diagnosis not present

## 2017-02-11 DIAGNOSIS — E876 Hypokalemia: Secondary | ICD-10-CM

## 2017-02-11 DIAGNOSIS — J309 Allergic rhinitis, unspecified: Secondary | ICD-10-CM | POA: Diagnosis present

## 2017-02-11 DIAGNOSIS — J90 Pleural effusion, not elsewhere classified: Secondary | ICD-10-CM | POA: Diagnosis present

## 2017-02-11 DIAGNOSIS — E1165 Type 2 diabetes mellitus with hyperglycemia: Secondary | ICD-10-CM | POA: Diagnosis not present

## 2017-02-11 DIAGNOSIS — M810 Age-related osteoporosis without current pathological fracture: Secondary | ICD-10-CM | POA: Diagnosis present

## 2017-02-11 DIAGNOSIS — E118 Type 2 diabetes mellitus with unspecified complications: Secondary | ICD-10-CM | POA: Diagnosis not present

## 2017-02-11 DIAGNOSIS — E160 Drug-induced hypoglycemia without coma: Secondary | ICD-10-CM | POA: Diagnosis not present

## 2017-02-11 DIAGNOSIS — I2510 Atherosclerotic heart disease of native coronary artery without angina pectoris: Secondary | ICD-10-CM

## 2017-02-11 DIAGNOSIS — G0430 Acute necrotizing hemorrhagic encephalopathy, unspecified: Secondary | ICD-10-CM | POA: Diagnosis not present

## 2017-02-11 DIAGNOSIS — M6281 Muscle weakness (generalized): Secondary | ICD-10-CM | POA: Diagnosis not present

## 2017-02-11 DIAGNOSIS — H40009 Preglaucoma, unspecified, unspecified eye: Secondary | ICD-10-CM | POA: Diagnosis not present

## 2017-02-11 DIAGNOSIS — Z79899 Other long term (current) drug therapy: Secondary | ICD-10-CM | POA: Diagnosis not present

## 2017-02-11 DIAGNOSIS — R296 Repeated falls: Secondary | ICD-10-CM | POA: Diagnosis not present

## 2017-02-11 DIAGNOSIS — F0390 Unspecified dementia without behavioral disturbance: Secondary | ICD-10-CM | POA: Diagnosis not present

## 2017-02-11 DIAGNOSIS — Z681 Body mass index (BMI) 19 or less, adult: Secondary | ICD-10-CM | POA: Diagnosis not present

## 2017-02-11 DIAGNOSIS — R627 Adult failure to thrive: Secondary | ICD-10-CM | POA: Diagnosis not present

## 2017-02-11 DIAGNOSIS — E785 Hyperlipidemia, unspecified: Secondary | ICD-10-CM | POA: Diagnosis not present

## 2017-02-11 DIAGNOSIS — Z955 Presence of coronary angioplasty implant and graft: Secondary | ICD-10-CM | POA: Diagnosis not present

## 2017-02-11 DIAGNOSIS — M353 Polymyalgia rheumatica: Secondary | ICD-10-CM | POA: Diagnosis not present

## 2017-02-11 DIAGNOSIS — H9190 Unspecified hearing loss, unspecified ear: Secondary | ICD-10-CM | POA: Diagnosis present

## 2017-02-11 DIAGNOSIS — H8149 Vertigo of central origin, unspecified ear: Secondary | ICD-10-CM | POA: Diagnosis not present

## 2017-02-11 DIAGNOSIS — R5383 Other fatigue: Secondary | ICD-10-CM | POA: Diagnosis not present

## 2017-02-11 DIAGNOSIS — I10 Essential (primary) hypertension: Secondary | ICD-10-CM

## 2017-02-11 DIAGNOSIS — M316 Other giant cell arteritis: Secondary | ICD-10-CM | POA: Diagnosis not present

## 2017-02-11 DIAGNOSIS — I25119 Atherosclerotic heart disease of native coronary artery with unspecified angina pectoris: Secondary | ICD-10-CM | POA: Diagnosis not present

## 2017-02-11 DIAGNOSIS — F419 Anxiety disorder, unspecified: Secondary | ICD-10-CM | POA: Diagnosis present

## 2017-02-11 DIAGNOSIS — Z66 Do not resuscitate: Secondary | ICD-10-CM | POA: Diagnosis present

## 2017-02-11 DIAGNOSIS — S6292XD Unspecified fracture of left wrist and hand, subsequent encounter for fracture with routine healing: Secondary | ICD-10-CM | POA: Diagnosis not present

## 2017-02-11 DIAGNOSIS — E0869 Diabetes mellitus due to underlying condition with other specified complication: Secondary | ICD-10-CM | POA: Diagnosis not present

## 2017-02-11 DIAGNOSIS — Z90710 Acquired absence of both cervix and uterus: Secondary | ICD-10-CM | POA: Diagnosis not present

## 2017-02-11 DIAGNOSIS — E43 Unspecified severe protein-calorie malnutrition: Secondary | ICD-10-CM | POA: Diagnosis not present

## 2017-02-11 DIAGNOSIS — I119 Hypertensive heart disease without heart failure: Secondary | ICD-10-CM | POA: Diagnosis present

## 2017-02-11 DIAGNOSIS — G9341 Metabolic encephalopathy: Secondary | ICD-10-CM | POA: Diagnosis present

## 2017-02-11 DIAGNOSIS — R4182 Altered mental status, unspecified: Secondary | ICD-10-CM | POA: Diagnosis not present

## 2017-02-11 DIAGNOSIS — G5600 Carpal tunnel syndrome, unspecified upper limb: Secondary | ICD-10-CM | POA: Diagnosis not present

## 2017-02-11 DIAGNOSIS — R1312 Dysphagia, oropharyngeal phase: Secondary | ICD-10-CM | POA: Diagnosis not present

## 2017-02-11 LAB — URINALYSIS, ROUTINE W REFLEX MICROSCOPIC
BILIRUBIN URINE: NEGATIVE
HGB URINE DIPSTICK: NEGATIVE
Ketones, ur: NEGATIVE mg/dL
LEUKOCYTES UA: NEGATIVE
NITRITE: NEGATIVE
PROTEIN: NEGATIVE mg/dL
Specific Gravity, Urine: 1.015 (ref 1.005–1.030)
pH: 6 (ref 5.0–8.0)

## 2017-02-11 LAB — BASIC METABOLIC PANEL
Anion gap: 9 (ref 5–15)
BUN: 8 mg/dL (ref 6–20)
CHLORIDE: 105 mmol/L (ref 101–111)
CO2: 26 mmol/L (ref 22–32)
Calcium: 9 mg/dL (ref 8.9–10.3)
Creatinine, Ser: 0.61 mg/dL (ref 0.44–1.00)
GFR calc Af Amer: >60 mL/min (ref >60)
GFR calc non Af Amer: >60 mL/min (ref >60)
GLUCOSE: 166 mg/dL — AB (ref 65–99)
POTASSIUM: 3.3 mmol/L — AB (ref 3.5–5.1)
SODIUM: 140 mmol/L (ref 135–145)

## 2017-02-11 LAB — CBC
HEMATOCRIT: 36.6 % (ref 36.0–46.0)
Hemoglobin: 11.4 g/dL — ABNORMAL LOW (ref 12.0–15.0)
MCH: 28.3 pg (ref 26.0–34.0)
MCHC: 31.1 g/dL (ref 30.0–36.0)
MCV: 90.8 fL (ref 78.0–100.0)
Platelets: 203 10*3/uL (ref 150–400)
RBC: 4.03 MIL/uL (ref 3.87–5.11)
RDW: 14.7 % (ref 11.5–15.5)
WBC: 6.7 10*3/uL (ref 4.0–10.5)

## 2017-02-11 LAB — TSH: TSH: 1.627 u[IU]/mL (ref 0.350–4.500)

## 2017-02-11 LAB — GLUCOSE, CAPILLARY
GLUCOSE-CAPILLARY: 134 mg/dL — AB (ref 65–99)
GLUCOSE-CAPILLARY: 180 mg/dL — AB (ref 65–99)
Glucose-Capillary: 156 mg/dL — ABNORMAL HIGH (ref 65–99)
Glucose-Capillary: 230 mg/dL — ABNORMAL HIGH (ref 65–99)
Glucose-Capillary: 280 mg/dL — ABNORMAL HIGH (ref 65–99)
Glucose-Capillary: 168 mg/dL — ABNORMAL HIGH (ref 65–99)
Glucose-Capillary: 170 mg/dL — ABNORMAL HIGH (ref 65–99)

## 2017-02-11 LAB — CBG MONITORING, ED: GLUCOSE-CAPILLARY: 154 mg/dL — AB (ref 65–99)

## 2017-02-11 LAB — TROPONIN I
TROPONIN I: 0.2 ng/mL — AB (ref <0.03)
TROPONIN I: 0.07 ng/mL — AB (ref <0.03)
TROPONIN I: 0.05 ng/mL — AB (ref <0.03)

## 2017-02-11 LAB — MAGNESIUM: Magnesium: 1.2 mg/dL — ABNORMAL LOW (ref 1.7–2.4)

## 2017-02-11 MED ORDER — HALOPERIDOL LACTATE 5 MG/ML IJ SOLN
1.0000 mg | Freq: Four times a day (QID) | INTRAMUSCULAR | Status: DC | PRN
  Administered 2017-02-11 – 2017-02-14 (×4): 1 mg via INTRAVENOUS
  Filled 2017-02-11 (×4): qty 1

## 2017-02-11 MED ORDER — POTASSIUM CHLORIDE CRYS ER 20 MEQ PO TBCR
40.0000 meq | EXTENDED_RELEASE_TABLET | Freq: Once | ORAL | Status: AC
  Administered 2017-02-11: 40 meq via ORAL
  Filled 2017-02-11: qty 2

## 2017-02-11 MED ORDER — LORAZEPAM 2 MG/ML IJ SOLN
0.5000 mg | Freq: Four times a day (QID) | INTRAMUSCULAR | Status: DC | PRN
  Administered 2017-02-11 – 2017-02-14 (×2): 0.5 mg via INTRAVENOUS
  Filled 2017-02-11 (×2): qty 1

## 2017-02-11 NOTE — Progress Notes (Signed)
NURSING PROGRESS NOTE  [name] Mcnelly IsleyMRN: [id number] Admission Data: 02/11/17 Attending Provider: Vianne Bulls, MD PCP: Eulas Post, MD Code status: [name]  Allergies:  Allergies  Allergen Reactions  . Penicillins Mcclain and Other (See Comments)    Has patient had a PCN reaction causing immediate rash, facial/tongue/throat swelling, SOB or lightheadedness with hypotension: Yes Has patient had a PCN reaction causing severe rash involving mucus membranes or skin necrosis: Unk Has patient had a PCN reaction that required hospitalization: Unk Has patient had a PCN reaction occurring within the last 10 years: Unk If all of the above answers are "NO", then may proceed with Cephalosporin use.  [name]   Past Medical History:  Past Medical History:  Diagnosis Date  . BPPV (benign paroxysmal positional vertigo)   . CAD (coronary artery disease)    a. Inferior STEMI s/p BMSx2 to RCA. Coronary anomaly with single coronary ostium arising from right sinus of Valsalva and likely LM course between PA and aorta.  . Carpal tunnel syndrome 08/04/2009  . Diabetes mellitus ([place])   . Essential hypertension   . [place], [place] 03/26/2010  . INCONTINENCE, STRESS, FEMALE 10/12/2006  . Inferior MI ([place]) 04/21/2011  . 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 H/o Left eye hemianopsia (longstanding).  . Kidney stone   . Multinodular goiter   . Osteoporosis, unspecified 10/12/2006  . RHINITIS, ALLERGIC 10/12/2006  . Single kidney    a. Absence of right kidney by prior imaging.  . TEMPORAL ARTERITIS 10/12/2006  . UNSTEADY GAIT 01/22/2007   Past Surgical History:  Past Surgical History:  Procedure Laterality Date  . ABDOMINAL HYSTERECTOMY    . CATARACT EXTRACTION, BILATERAL    . CORONARY ANGIOPLASTY     [name] is a 83 y.o. female patient, arrived to floor in room 5W07 via stretcher, transferred from ED. Patient alert and oriented X 0. Pt agitated, kicking, screaming, and  grabbing at staff. Opyd,MD notified.   Vital signs:Blood pressure 127/83, Pulse 104, RR 18, SpO2 97 % on room air.   IV access: Right AC-saline locked; condition patent and no redness.  Skin: intact, no pressure ulcer noted in sacral area.Bruising and skin tears noted on arms and legs bilaterally.   Patient's ID armband verified with patient and in place. Information packet given to patient. Fall risk assessed, SR up X2, Call bell within reach.

## 2017-02-11 NOTE — Progress Notes (Addendum)
Patient is agitated, yelling, screaming, and kicking at staff. PO ativan only available. This RN paged Opyd,MD. Opyd,MD placed PRN order for 0.5mg  of IV ativan and 1mg  of IV haldol. Opyd,MD discontinued PO ativan. 0.5mg  of IV Ativan given at 0132 to patient. Will continue to monitor and treat per MD orders.

## 2017-02-11 NOTE — Progress Notes (Signed)
PROGRESS NOTE    [name]  UKG:[id number] DOB: 1923-05-16 DOA: 02/10/2017 PCP: Eulas Post, MD   Chief Complaint  Patient presents with  . Hypoglycemia    Brief Narrative:  HPI on 02/10/2017 by Dr. Christia Reading Opyd [name] is a 83 y.o. female with medical history significant for type 2 diabetes mellitus, hypertension, CAD, and chronic normocytic anemia, now presenting to the emergency department from her SNF after being found unresponsive by staff. Per the report of SNF personnel, patient was in her usual state around 5 PM, was given 6 units of NovoLog per her sliding scale, did not eat much of her dinner, and retired to her quarters where she was found unresponsive just prior to arrival. EMS was called out, found CBG to [name], administered one ampule of D50%, and transported the patient to the hospital. She was started on insulin in addition to her metformin 2 weeks ago, but had seemed to tolerate it without incident until today. She seemed to be having an uneventful day leading up to this with no recent fall or trauma reported and no fevers documented. Patient has not expressed any complaints. Assessment & Plan   Hypoglycemia with a history of diabetes mellitus, type II -Patient usually taking metformin as well as insulin sliding scale -Hemoglobin A1c in February 2018 was 8.3 -Has had poor oral intake however has been receiving NovoLog, patient was found unresponsive with a CBG of 12 -Was given an amp of D50 by EMS, blood sugar on arrival was 103 however on chemistry was 56 -Patient was started on D5 1/2 NS IV fluids -Chest x-ray and UA unremarkable for infection -Troponin pending -Continue to monitor CBGs- between 150s to 280 -Will discontinue IVF and monitor CBGs closely, cover with ISS -Hemoglobin A1c pending  Acute encephalopathy -Secondary to hypoglycemia -CT head unremarkable for acute intracranial process -TSH 1.627, normal -Suspect back to baseline per  communication with RN and SNF  Hypokalemia -Potassium 3.1 on admission, EKG with no changes -Suspect secondary to excess insulin administration -Continue to replace and monitor BMP  Normocytic anemia -Baseline hemoglobin approximately 10-11, currently 11.4 -Continue to monitor CBC  Coronary artery disease -Chest pain-free -EKG with no changes -Troponin pending -Continue metoprolol, losartan, statin  Essential hypertension -Continue losartan and metoprolol  Anxiety -Continue BuSpar and Ativan  Pleural effusion -Noted on chest x-ray, small left pleural effusion  DVT Prophylaxis  Lovenox  Code Status: [name]  Family Communication: None at bedside  Disposition Plan: Currently in observation. D/c SNF on 02/12/2017 pending blood sugars  Consultants None  Procedures  None  Antibiotics   Anti-infectives    None      Subjective:   [name] seen and examined today.  No complaints, wishes to be left alone  Objective:   Vitals:   02/11/17 0117 02/11/17 0338 02/11/17 0432 02/11/17 0533  BP: 127/83   (!) 149/72  Pulse: (!) 104   (!) 101  Resp: 18   18  Temp:  98.6 F (37 C)  98.3 F (36.8 C)  TempSrc:  Axillary  Oral  SpO2: 97%   97%  Weight:   44.9 kg (99 lb)     Intake/Output Summary (Last 24 hours) at 02/11/17 1129 Last data filed at 02/11/17 0900  Gross per 24 hour  Intake              520 ml  Output  0 ml  Net              520 ml   Filed Weights   02/11/17 0432  Weight: 44.9 kg (99 lb)    Exam  General: Well developed, elderly, thin, NAD  HEENT: NCAT, mucous membranes moist.   Cardiovascular: S1 S2 auscultated, no rubs, murmurs or gallops. Regular rate and rhythm.  Respiratory: Clear to auscultation bilaterally with equal chest rise  Abdomen: Soft, nontender, nondistended, + bowel sounds  Extremities: warm dry without cyanosis clubbing or edema  Neuro: Alert and awake, not very interactive. Yells. Hard of hearing. Moves all  extremities with ease   Data Reviewed: I have personally reviewed following labs and imaging studies  CBC:  Recent Labs Lab 02/10/17 2152 02/11/17 0037  WBC 11.0* 6.7  HGB 9.2* 11.4*  HCT 29.2* 36.6  MCV 91.8 90.8  PLT 347 161   Basic Metabolic Panel:  Recent Labs Lab 02/10/17 2152 02/11/17 0037  NA 140 140  K 3.1* 3.3*  CL 103 105  CO2 27 26  GLUCOSE 56* 166*  BUN 10 8  CREATININE 0.66 0.61  CALCIUM 8.7* 9.0   GFR: Estimated Creatinine Clearance: 31.8 mL/min (by C-G formula based on SCr of 0.61 mg/dL). Liver Function Tests:  Recent Labs Lab 02/10/17 2152  AST 27  ALT 13*  ALKPHOS 91  BILITOT 0.4  PROT 5.6*  ALBUMIN 2.3*   No results for input(s): LIPASE, AMYLASE in the last 168 hours. No results for input(s): AMMONIA in the last 168 hours. Coagulation Profile: No results for input(s): INR, PROTIME in the last 168 hours. Cardiac Enzymes: No results for input(s): CKTOTAL, CKMB, CKMBINDEX, TROPONINI in the last 168 hours. BNP (last 3 results) No results for input(s): PROBNP in the last 8760 hours. HbA1C: No results for input(s): HGBA1C in the last 72 hours. CBG:  Recent Labs Lab 02/10/17 2247 02/11/17 0037 02/11/17 0122 02/11/17 0359 02/11/17 0749  GLUCAP 158* 154* 156* 230* 280*   Lipid Profile: No results for input(s): CHOL, HDL, LDLCALC, TRIG, CHOLHDL, LDLDIRECT in the last 72 hours. Thyroid Function Tests:  Recent Labs  02/11/17 0037  TSH 1.627   Anemia Panel: No results for input(s): VITAMINB12, FOLATE, FERRITIN, TIBC, IRON, RETICCTPCT in the last 72 hours. Urine analysis:    Component Value Date/Time   COLORURINE YELLOW 02/11/2017 1059   APPEARANCEUR CLEAR 02/11/2017 1059   LABSPEC 1.015 02/11/2017 1059   PHURINE 6.0 02/11/2017 1059   GLUCOSEU >=500 (A) 02/11/2017 1059   HGBUR NEGATIVE 02/11/2017 1059   BILIRUBINUR NEGATIVE 02/11/2017 1059   BILIRUBINUR neg 12/03/2013 1135   KETONESUR NEGATIVE 02/11/2017 1059   PROTEINUR  NEGATIVE 02/11/2017 1059   UROBILINOGEN 0.2 12/03/2013 1135   UROBILINOGEN 1.0 11/14/2013 1325   NITRITE NEGATIVE 02/11/2017 1059   LEUKOCYTESUR NEGATIVE 02/11/2017 1059   Sepsis Labs: @LABRCNTIP (procalcitonin:4,lacticidven:4)  )No results found for this or any previous visit (from the past 240 hour(s)).    Radiology Studies: Dg Chest 2 View  Result Date: 02/10/2017 CLINICAL DATA:  Altered mental status EXAM: CHEST  2 VIEW COMPARISON:  01/19/2017, 11/29/2016 FINDINGS: Small left-sided pleural effusion. Hazy atelectasis or infiltrate at the left lung base. Mild cardiomegaly with atherosclerosis. No pneumothorax. Deformity of the anterior tests well as before. IMPRESSION: 1. Small left pleural effusion with hazy left basilar atelectasis or infiltrate 2. Mild cardiomegaly Electronically Signed   By: Donavan Foil M.D.   On: 02/10/2017 21:56   Ct Head Wo Contrast  Result Date: 02/10/2017  CLINICAL DATA:  Altered mental status. History of diabetes, temporal arteritis/ polymyalgia rheumatica. EXAM: CT HEAD WITHOUT CONTRAST TECHNIQUE: Contiguous axial images were obtained from the base of the skull through the vertex without intravenous contrast. COMPARISON:  CT HEAD January 19, 2017 FINDINGS: BRAIN: No intraparenchymal hemorrhage, mass effect nor midline shift. The ventricles and sulci are normal for age though, there is mild sulcal effacement at the convexities and narrowed callosal angle. Patchy supratentorial white matter hypodensities within normal range for patient's age, though non-specific are most compatible with chronic small vessel ischemic disease. No acute large vascular territory infarcts. No abnormal extra-axial fluid collections. Re- demonstration of partially calcified 2 cm RIGHT frontal meningioma without mass effect. Dural calcifications noted. Basal cisterns are patent. VASCULAR: Moderate calcific atherosclerosis of the carotid siphons. SKULL: No skull fracture. Moderate RIGHT severe LEFT  temporomandibular osteoarthrosis. No significant scalp soft tissue swelling. SINUSES/ORBITS: Mild paranasal sinus mucosal thickening. The included ocular globes and orbital contents are non-suspicious. Status post bilateral ocular lens implants. OTHER: None. IMPRESSION: 1. No acute intracranial process. 2. Stable involutional changes with a component of suspected normal pressure hydrocephalus. Mild-to-moderate chronic small vessel ischemic disease. 3. Stable 2 cm RIGHT frontal meningioma without mass effect. Electronically Signed   By: Elon Alas M.D.   On: 02/10/2017 21:45     Scheduled Meds: . atorvastatin  20 mg Oral q1800  . azelastine  2 spray Each Nare BID  . busPIRone  10 mg Oral BID  . cholecalciferol  2,000 Units Oral Daily  . enoxaparin (LOVENOX) injection  40 mg Subcutaneous Q24H  . insulin aspart  0-5 Units Subcutaneous Q4H  . latanoprost  1 drop Both Eyes QHS  . LORazepam  0.25 mg Oral BID  . losartan  50 mg Oral Daily  . metoprolol succinate  25 mg Oral Daily   Continuous Infusions:   LOS: 0 days   Time Spent in minutes   30 minutes  Brycen Bean D.O. on 02/11/2017 at 11:29 AM  Between 7am to 7pm - Pager - [phone]  After 7pm go to [email or web address] - password [email or web address]  And look for the night coverage person covering for me after hours  [facility] Office  [phone]

## 2017-02-12 DIAGNOSIS — R5383 Other fatigue: Secondary | ICD-10-CM

## 2017-02-12 DIAGNOSIS — R4182 Altered mental status, unspecified: Secondary | ICD-10-CM

## 2017-02-12 DIAGNOSIS — F0390 Unspecified dementia without behavioral disturbance: Secondary | ICD-10-CM

## 2017-02-12 DIAGNOSIS — E1165 Type 2 diabetes mellitus with hyperglycemia: Secondary | ICD-10-CM

## 2017-02-12 LAB — CBC
HCT: 28.6 % — ABNORMAL LOW (ref 36.0–46.0)
Hemoglobin: 9.1 g/dL — ABNORMAL LOW (ref 12.0–15.0)
MCH: 28.9 pg (ref 26.0–34.0)
MCHC: 31.8 g/dL (ref 30.0–36.0)
MCV: 90.8 fL (ref 78.0–100.0)
Platelets: 327 10*3/uL (ref 150–400)
RBC: 3.15 MIL/uL — ABNORMAL LOW (ref 3.87–5.11)
RDW: 14.9 % (ref 11.5–15.5)
WBC: 10.4 10*3/uL (ref 4.0–10.5)

## 2017-02-12 LAB — GLUCOSE, CAPILLARY
GLUCOSE-CAPILLARY: 194 mg/dL — AB (ref 65–99)
Glucose-Capillary: 133 mg/dL — ABNORMAL HIGH (ref 65–99)
Glucose-Capillary: 146 mg/dL — ABNORMAL HIGH (ref 65–99)
Glucose-Capillary: 133 mg/dL — ABNORMAL HIGH (ref 65–99)
Glucose-Capillary: 148 mg/dL — ABNORMAL HIGH (ref 65–99)

## 2017-02-12 LAB — BASIC METABOLIC PANEL
Anion gap: 7 (ref 5–15)
BUN: <5 mg/dL — ABNORMAL LOW (ref 6–20)
CALCIUM: 8.6 mg/dL — AB (ref 8.9–10.3)
CHLORIDE: 101 mmol/L (ref 101–111)
CO2: 28 mmol/L (ref 22–32)
CREATININE: 0.61 mg/dL (ref 0.44–1.00)
GFR calc Af Amer: >60 mL/min (ref >60)
GFR calc non Af Amer: >60 mL/min (ref >60)
GLUCOSE: 154 mg/dL — AB (ref 65–99)
Potassium: 4.5 mmol/L (ref 3.5–5.1)
Sodium: 136 mmol/L (ref 135–145)

## 2017-02-12 LAB — TROPONIN I: Troponin I: 0.05 ng/mL (ref <0.03)

## 2017-02-12 LAB — HEMOGLOBIN A1C
HEMOGLOBIN A1C: 7.7 % — AB (ref 4.8–5.6)
MEAN PLASMA GLUCOSE: 174 mg/dL

## 2017-02-12 NOTE — Progress Notes (Signed)
PROGRESS NOTE    [name]  UXL:[id number] DOB: 02/25/1923 DOA: 02/10/2017 PCP: Eulas Post, MD   Chief Complaint  Patient presents with  . Hypoglycemia    Brief Narrative:  HPI on 02/10/2017 by Dr. Christia Reading Opyd [name] is a 83 y.o. female with medical history significant for type 2 diabetes mellitus, hypertension, CAD, and chronic normocytic anemia, now presenting to the emergency department from her SNF after being found unresponsive by staff. Per the report of SNF personnel, patient was in her usual state around 5 PM, was given 6 units of NovoLog per her sliding scale, did not eat much of her dinner, and retired to her quarters where she was found unresponsive just prior to arrival. EMS was called out, found CBG to [name], administered one ampule of D50%, and transported the patient to the hospital. She was started on insulin in addition to her metformin 2 weeks ago, but had seemed to tolerate it without incident until today. She seemed to be having an uneventful day leading up to this with no recent fall or trauma reported and no fevers documented. Patient has not expressed any complaints. Assessment & Plan   Hypoglycemia with a history of diabetes mellitus, type II -Patient usually taking metformin as well as insulin sliding scale -Hemoglobin A1c in February 2018 was 8.3 -Has had poor oral intake however has been receiving NovoLog, patient was found unresponsive with a CBG of 12 -Was given an amp of D50 by EMS, blood sugar on arrival was 103 however on chemistry was 56 -Patient was started on D5 1/2 NS IV fluids -Chest x-ray and UA unremarkable for infection -Discontinued IVF, and blood sugars monitored, Currenltly CBG between 130-180 -Hemoglobin A1c pending and can be followed by PCP  Acute encephalopathy with underlying dementia -Secondary to hypoglycemia -CT head unremarkable for acute intracranial process -TSH 1.627, normal -Spoke with son via phone, patient has  dementia or "confusion" at baseline. He feels that some of medications she is on makes her "more loopy." Patient currently resides at assisted living facility  Physical Deconditioning  -Will consult PT and OT -Per son, patient has been falling at the ALF  Hypokalemia -Potassium 3.1 on admission, EKG with no changes -Suspect secondary to excess insulin administration -Potassium replaced and currently 4.5 -Continue to monitor BMP  Normocytic anemia -Baseline hemoglobin approximately 10-11, currently 9.1 -Suspect drop secondary to IVF -Continue to monitor CBC  Coronary artery disease -Chest pain-free -EKG with no changes -Troponin peaked at 0.2, currently trending downward 0.05 -Continue metoprolol, losartan, statin  Essential hypertension -Continue losartan and metoprolol  Anxiety -Continue BuSpar and Ativan  Pleural effusion -Notedon chest x-ray, small left pleural effusion  Poor oral intake/weight loss -Will consult nutrition  Goals of care -patient is [name] -Discussed with son and family via phone, patient does have some dementia at baseline, and has been declining over the past few months. She started using profane language about one month ago. Currently resides at ALF and feels patient may need SNF placement. Patient does use hearing aids, however, since she has lost weight, the hearing aids no longer fit and patient needs new ones. -Will consult SW and CM  DVT Prophylaxis  Lovenox  Code Status: [name]  Family Communication: None at bedside. Family via phone.  Disposition Plan: Admitted. Will consult PT/OT. Continue to monitor blood sugars.   Consultants None  Procedures  None  Antibiotics   Anti-infectives    None      Subjective:  [name] seen and examined today.  No complaints, wishes to be left alone. Yells out with profanities.   Objective:   Vitals:   02/11/17 0533 02/11/17 1455 02/11/17 1721 02/11/17 2149  BP: (!) 149/72 (!) 134/105  (!) 163/62 (!) 155/70  Pulse: (!) 101 (!) 104 (!) 103 99  Resp: 18 14 14 18   Temp: 98.3 F (36.8 C)  98.2 F (36.8 C) 98.5 F (36.9 C)  TempSrc: Oral   Oral  SpO2: 97% 100% 95% 97%  Weight:       No intake or output data in the 24 hours ending 02/12/17 1107 Filed Weights   02/11/17 0432  Weight: 44.9 kg (99 lb)   Exam  General: Well developed, elderly, thin, NAD  HEENT: NCAT, mucous membranes moist.   Cardiovascular: S1 S2 auscultated, RRR, no murmurs appreciated.  Respiratory: Clear to auscultation bilaterally with equal chest rise, no wheezing  Abdomen: Soft, nontender, nondistended, + bowel sounds  Extremities: warm dry without cyanosis clubbing or edema  Neuro: Alert and awake, yells out. Hard of hearing. Moves extremities with ease. Does not follow commands/instructino  Psych: Cannot assess due to dementia  Data Reviewed: I have personally reviewed following labs and imaging studies  CBC:  Recent Labs Lab 02/10/17 2152 02/11/17 0037 02/12/17 0633  WBC 11.0* 6.7 10.4  HGB 9.2* 11.4* 9.1*  HCT 29.2* 36.6 28.6*  MCV 91.8 90.8 90.8  PLT 347 203 342   Basic Metabolic Panel:  Recent Labs Lab 02/10/17 2152 02/11/17 0037 02/11/17 1402 02/12/17 0633  NA 140 140  --  136  K 3.1* 3.3*  --  4.5  CL 103 105  --  101  CO2 27 26  --  28  GLUCOSE 56* 166*  --  154*  BUN 10 8  --  <5*  CREATININE 0.66 0.61  --  0.61  CALCIUM 8.7* 9.0  --  8.6*  MG  --   --  1.2*  --    GFR: Estimated Creatinine Clearance: 31.8 mL/min (by C-G formula based on SCr of 0.61 mg/dL). Liver Function Tests:  Recent Labs Lab 02/10/17 2152  AST 27  ALT 13*  ALKPHOS 91  BILITOT 0.4  PROT 5.6*  ALBUMIN 2.3*   No results for input(s): LIPASE, AMYLASE in the last 168 hours. No results for input(s): AMMONIA in the last 168 hours. Coagulation Profile: No results for input(s): INR, PROTIME in the last 168 hours. Cardiac Enzymes:  Recent Labs Lab 02/11/17 1229 02/11/17 1533  02/11/17 2119 02/12/17 0633  TROPONINI 0.20* 0.07* 0.05* 0.05*   BNP (last 3 results) No results for input(s): PROBNP in the last 8760 hours. HbA1C: No results for input(s): HGBA1C in the last 72 hours. CBG:  Recent Labs Lab 02/11/17 1717 02/11/17 2033 02/11/17 2356 02/12/17 0522 02/12/17 0737  GLUCAP 168* 170* 134* 133* 146*   Lipid Profile: No results for input(s): CHOL, HDL, LDLCALC, TRIG, CHOLHDL, LDLDIRECT in the last 72 hours. Thyroid Function Tests:  Recent Labs  02/11/17 0037  TSH 1.627   Anemia Panel: No results for input(s): VITAMINB12, FOLATE, FERRITIN, TIBC, IRON, RETICCTPCT in the last 72 hours. Urine analysis:    Component Value Date/Time   COLORURINE YELLOW 02/11/2017 1059   APPEARANCEUR CLEAR 02/11/2017 1059   LABSPEC 1.015 02/11/2017 1059   PHURINE 6.0 02/11/2017 1059   GLUCOSEU >=500 (A) 02/11/2017 1059   HGBUR NEGATIVE 02/11/2017 [place] 02/11/2017 1059   BILIRUBINUR neg 12/03/2013 1135  KETONESUR NEGATIVE 02/11/2017 1059   PROTEINUR NEGATIVE 02/11/2017 1059   UROBILINOGEN 0.2 12/03/2013 1135   UROBILINOGEN 1.0 11/14/2013 1325   NITRITE NEGATIVE 02/11/2017 1059   LEUKOCYTESUR NEGATIVE 02/11/2017 1059   Sepsis Labs: @LABRCNTIP (procalcitonin:4,lacticidven:4)  )No results found for this or any previous visit (from the past 240 hour(s)).    Radiology Studies: Dg Chest 2 View  Result Date: 02/10/2017 CLINICAL DATA:  Altered mental status EXAM: CHEST  2 VIEW COMPARISON:  01/19/2017, 11/29/2016 FINDINGS: Small left-sided pleural effusion. Hazy atelectasis or infiltrate at the left lung base. Mild cardiomegaly with atherosclerosis. No pneumothorax. Deformity of the anterior tests well as before. IMPRESSION: 1. Small left pleural effusion with hazy left basilar atelectasis or infiltrate 2. Mild cardiomegaly Electronically Signed   By: Donavan Foil M.D.   On: 02/10/2017 21:56   Ct Head Wo Contrast  Result Date:  02/10/2017 CLINICAL DATA:  Altered mental status. History of diabetes, temporal arteritis/ polymyalgia rheumatica. EXAM: CT HEAD WITHOUT CONTRAST TECHNIQUE: Contiguous axial images were obtained from the base of the skull through the vertex without intravenous contrast. COMPARISON:  CT HEAD January 19, 2017 FINDINGS: BRAIN: No intraparenchymal hemorrhage, mass effect nor midline shift. The ventricles and sulci are normal for age though, there is mild sulcal effacement at the convexities and narrowed callosal angle. Patchy supratentorial white matter hypodensities within normal range for patient's age, though non-specific are most compatible with chronic small vessel ischemic disease. No acute large vascular territory infarcts. No abnormal extra-axial fluid collections. Re- demonstration of partially calcified 2 cm RIGHT frontal meningioma without mass effect. Dural calcifications noted. Basal cisterns are patent. VASCULAR: Moderate calcific atherosclerosis of the carotid siphons. SKULL: No skull fracture. Moderate RIGHT severe LEFT temporomandibular osteoarthrosis. No significant scalp soft tissue swelling. SINUSES/ORBITS: Mild paranasal sinus mucosal thickening. The included ocular globes and orbital contents are non-suspicious. Status post bilateral ocular lens implants. OTHER: None. IMPRESSION: 1. No acute intracranial process. 2. Stable involutional changes with a component of suspected normal pressure hydrocephalus. Mild-to-moderate chronic small vessel ischemic disease. 3. Stable 2 cm RIGHT frontal meningioma without mass effect. Electronically Signed   By: Elon Alas M.D.   On: 02/10/2017 21:45     Scheduled Meds: . atorvastatin  20 mg Oral q1800  . azelastine  2 spray Each Nare BID  . busPIRone  10 mg Oral BID  . cholecalciferol  2,000 Units Oral Daily  . enoxaparin (LOVENOX) injection  40 mg Subcutaneous Q24H  . insulin aspart  0-5 Units Subcutaneous Q4H  . latanoprost  1 drop Both Eyes QHS   . LORazepam  0.25 mg Oral BID  . losartan  50 mg Oral Daily  . metoprolol succinate  25 mg Oral Daily   Continuous Infusions:   LOS: 1 day   Time Spent in minutes   40 minutes  Lynsie Mcwatters D.O. on 02/12/2017 at 11:07 AM  Between 7am to 7pm - Pager - [phone]  After 7pm go to [email or web address] - password [email or web address]  And look for the night coverage person covering for me after hours  [facility] Office  [phone]
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
## 2017-02-12 NOTE — Evaluation (Signed)
Physical Therapy Evaluation Patient Details Name: [name] MRN: [id number] DOB: 1923-03-01 Today's Date: 02/12/2017   History of Present Illness  Pt is a 83 yo female admitted through ED from ALF on 02/10/17 for hypoglycemia with BG at 12. Pt was given glucose in transit and increased to 2014 then immediately dropped to 54. Pt diagnosed with hypoglycemia, acute encephalopathy, and hypokalemia. PMH significant for DM2, HTN, CAD, normocytic anemia, dementia.  Clinical Impression  Pt presents with the above diagnosis and below deficits for therapy evaluation. Prior to admission, pt was staying in a memory care unit of ALF. Pt required assistance, but unsure of how much as now family is present to assist. Pt requires Mod A for bed mobility and Max A for transfer from bed to chair. Pt is very HOH and it makes it difficult to communicate. May need to attempt writing instructions at next session. Pt will benefit from SNF at discharge in order to assist with improving mobility at discharge. Continued acute rehab services are required to address the below deficits prior to discharge.     Follow Up Recommendations SNF;Supervision/Assistance - 24 hour    Equipment Recommendations  None recommended by PT    Recommendations for Other Services       Precautions / Restrictions Precautions Precautions: Fall Restrictions Weight Bearing Restrictions: No      Mobility  Bed Mobility Overal bed mobility: Needs Assistance Bed Mobility: Rolling;Sidelying to Sit Rolling: Mod assist Sidelying to sit: Max assist       General bed mobility comments: Max A to EOB with assistance for LE's and to bring trunk upright at EOB  Transfers Overall transfer level: Needs assistance Equipment used: 2 person hand held assist Transfers: Sit to/from [facility] Sit to Stand: Max assist;+2 physical assistance Stand pivot transfers: Max assist;+2 physical assistance       General transfer  comment: Max A x2 from EOB to recliner. Pt is very HOH and its difficult to communicate instructions.   Ambulation/Gait             General Gait Details: unable to assess  Stairs            Wheelchair Mobility    Modified Rankin (Stroke Patients Only)       Balance Overall balance assessment: Needs assistance Sitting-balance support: Bilateral upper extremity supported;Feet supported Sitting balance-Leahy Scale: Poor     Standing balance support: Bilateral upper extremity supported Standing balance-Leahy Scale: Zero                               Pertinent Vitals/Pain Pain Assessment: No/denies pain    Home Living Family/patient expects to be discharged to:: Skilled nursing facility                 Additional Comments: Pt in ALF, dementia progressing and family considering SNF per chart review    Prior Function Level of Independence: Needs assistance   Gait / Transfers Assistance Needed: unsure, possibly ambulatory?  ADL's / Homemaking Assistance Needed: requries assistance due to dementia  Comments: No family present to get baseline information     Hand Dominance   Dominant Hand: Right    Extremity/Trunk Assessment   Upper Extremity Assessment Upper Extremity Assessment: Defer to OT evaluation    Lower Extremity Assessment Lower Extremity Assessment: Generalized weakness    Cervical / Trunk Assessment Cervical / Trunk Assessment: Kyphotic  Communication   Communication:  HOH;Other (comment) (almost deaf)  Cognition Arousal/Alertness: Awake/alert Behavior During Therapy: Flat affect Overall Cognitive Status: No family/caregiver present to determine baseline cognitive functioning                                 General Comments: very HOH, diffiuclt to assess. Will follow visual cues      General Comments      Exercises     Assessment/Plan    PT Assessment Patient needs continued PT services  PT  Problem List Decreased strength;Decreased activity tolerance;Decreased balance;Decreased mobility;Decreased cognition;Decreased safety awareness       PT Treatment Interventions DME instruction;Gait training;Functional mobility training;Therapeutic activities;Therapeutic exercise;Balance training    PT Goals (Current goals can be found in the Care Plan section)  Acute Rehab PT Goals Patient Stated Goal: none stated PT Goal Formulation: Patient unable to participate in goal setting Time For Goal Achievement: 02/26/17 Potential to Achieve Goals: Fair    Frequency Min 2X/week   Barriers to discharge        Co-evaluation               AM-PAC PT "6 Clicks" Daily Activity  Outcome Measure Difficulty turning over in bed (including adjusting bedclothes, sheets and blankets)?: Total Difficulty moving from lying on back to sitting on the side of the bed? : Total Difficulty sitting down on and standing up from a chair with arms (e.g., wheelchair, bedside commode, etc,.)?: Total Help needed moving to and from a bed to chair (including a wheelchair)?: Total Help needed walking in hospital room?: Total Help needed climbing 3-5 steps with a railing? : Total 6 Click Score: 6    End of Session Equipment Utilized During Treatment: Gait belt Activity Tolerance: Patient limited by fatigue Patient left: in chair;with call bell/phone within reach;with chair alarm set Nurse Communication: Mobility status PT Visit Diagnosis: Unsteadiness on feet (R26.81);Muscle weakness (generalized) (M62.81)    Time: 2010-0712 PT Time Calculation (min) (ACUTE ONLY): 30 min   Charges:   PT Evaluation $PT Eval Moderate Complexity: 1 Procedure PT Treatments $Therapeutic Activity: 8-22 mins   PT G Codes:        Scheryl Marten PT, DPT  [phone]    Shanon Rosser 02/12/2017, 3:02 PM

## 2017-02-13 LAB — BASIC METABOLIC PANEL
Anion gap: 5 (ref 5–15)
BUN: <5 mg/dL — ABNORMAL LOW (ref 6–20)
CHLORIDE: 105 mmol/L (ref 101–111)
CO2: 28 mmol/L (ref 22–32)
CREATININE: 0.55 mg/dL (ref 0.44–1.00)
Calcium: 8.7 mg/dL — ABNORMAL LOW (ref 8.9–10.3)
Glucose, Bld: 143 mg/dL — ABNORMAL HIGH (ref 65–99)
Potassium: 3.9 mmol/L (ref 3.5–5.1)
SODIUM: 138 mmol/L (ref 135–145)

## 2017-02-13 LAB — CBC
HCT: 28.9 % — ABNORMAL LOW (ref 36.0–46.0)
HEMOGLOBIN: 9 g/dL — AB (ref 12.0–15.0)
MCH: 28.1 pg (ref 26.0–34.0)
MCHC: 31.1 g/dL (ref 30.0–36.0)
MCV: 90.3 fL (ref 78.0–100.0)
PLATELETS: 303 10*3/uL (ref 150–400)
RBC: 3.2 MIL/uL — AB (ref 3.87–5.11)
RDW: 14.6 % (ref 11.5–15.5)
WBC: 8.3 10*3/uL (ref 4.0–10.5)

## 2017-02-13 LAB — GLUCOSE, CAPILLARY
GLUCOSE-CAPILLARY: 133 mg/dL — AB (ref 65–99)
GLUCOSE-CAPILLARY: 137 mg/dL — AB (ref 65–99)
GLUCOSE-CAPILLARY: 199 mg/dL — AB (ref 65–99)
GLUCOSE-CAPILLARY: 163 mg/dL — AB (ref 65–99)
GLUCOSE-CAPILLARY: 144 mg/dL — AB (ref 65–99)
Glucose-Capillary: 69 mg/dL (ref 65–99)
Glucose-Capillary: 122 mg/dL — ABNORMAL HIGH (ref 65–99)

## 2017-02-13 MED ORDER — ENSURE ENLIVE PO LIQD: 237.0000 mL | Freq: Two times a day (BID) | ORAL | Status: DC

## 2017-02-13 MED ORDER — ADULT MULTIVITAMIN W/MINERALS CH
1.0000 | ORAL_TABLET | Freq: Every day | ORAL | Status: DC
  Administered 2017-02-13: 1 via ORAL
  Filled 2017-02-13 (×2): qty 1

## 2017-02-13 MED ORDER — PRO-STAT SUGAR FREE PO LIQD
30.0000 mL | Freq: Every day | ORAL | Status: DC
  Filled 2017-02-13: qty 30

## 2017-02-13 NOTE — Progress Notes (Signed)
PROGRESS NOTE    [name]  ZOX:[id number] DOB: 1923-07-09 DOA: 02/10/2017 PCP: Eulas Post, MD   Chief Complaint  Patient presents with  . Hypoglycemia    Brief Narrative:  HPI on 02/10/2017 by Dr. Christia Reading Opyd [name] is a 83 y.o. female with medical history significant for type 2 diabetes mellitus, hypertension, CAD, and chronic normocytic anemia, now presenting to the emergency department from her SNF after being found unresponsive by staff. Per the report of SNF personnel, patient was in her usual state around 5 PM, was given 6 units of NovoLog per her sliding scale, did not eat much of her dinner, and retired to her quarters where she was found unresponsive just prior to arrival. EMS was called out, found CBG to [name], administered one ampule of D50%, and transported the patient to the hospital. She was started on insulin in addition to her metformin 2 weeks ago, but had seemed to tolerate it without incident until today. She seemed to be having an uneventful day leading up to this with no recent fall or trauma reported and no fevers documented. Patient has not expressed any complaints. Assessment & Plan   Hypoglycemia with a history of diabetes mellitus, type II -Patient usually taking metformin as well as insulin sliding scale -hemoglobin A1c 7.7 (was 8.3 in Feb 2018) -Has had poor oral intake however has been receiving NovoLog, patient was found unresponsive with a CBG of 12 -Was given an amp of D50 by EMS, blood sugar on arrival was 103 however on chemistry was 56 -Patient was started on D5 1/2 NS IV fluids, and discontinued -Chest x-ray and UA unremarkable for infection -CBGs now ranging between 122-194 -Will continue only insulin sliding scale  -Discussed with patient's family, her appetite and oral intake have been poor. Given this information, she likely does not need both metformin and ISS. If patient is able to be discharged to SNF, would only continue  ISS  Acute encephalopathy with underlying dementia -Secondary to hypoglycemia -CT head unremarkable for acute intracranial process -TSH 1.627, normal -Spoke with son via phone, patient has dementia or "confusion" at baseline. He feels that some of medications she is taking maker her "more loopy". She currently resides at an assisted living facility.   Physical Deconditioning  -Per son, patient has been falling at the ALF frequently and hitting her head -Do not feel that it is safe to discharge patient back to an assisted living as she appears to be need skillable assistance -PT consulted and recommended SNF -OT consulted and pending   Hypokalemia -Potassium 3.1 on admission, EKG with no changes -Suspect secondary to excess insulin administration -Resolved, currently 3.9 -Continue to monitor BMP  Normocytic anemia -Baseline hemoglobin approximately 10-11, currently 9.0 -Suspect drop secondary to IVF -no active bleeding -Continue to monitor CBC  Coronary artery disease -Chest pain-free -EKG with no changes -Troponin peaked at 0.2, currently trending downward 0.05 -Continue metoprolol, losartan, statin  Essential hypertension -Continue losartan and metoprolol  Anxiety -Continue BuSpar and Ativan  Pleural effusion -Notedon chest x-ray, small left pleural effusion  Poor oral intake/weight loss -Nutrition consulted and pending  Goals of care -patient is [name] -Discussed with son and family via phone, patient does have some dementia at baseline, and has been declining over the past few months. She started using profane language about one month ago. Currently resides at ALF and feels patient may need SNF placement. Patient does use hearing aids, however, since she has lost weight,  the hearing aids no longer fit and patient needs new ones. -Social work consulted for SNF placement  DVT Prophylaxis  Lovenox  Code Status: [name]  Family Communication: None at bedside.  Discussed with family on 02/12/2017 via phone.  Disposition Plan: Admitted. Continue to monitor mental status and hemoglobin/CBG.   Consultants None  Procedures  None  Antibiotics   Anti-infectives    None      Subjective:   [name] seen and examined today. Patient with history of dementia and hard of hearing.  No complaints this morning. Would like to get up. Denies pain.   Objective:   Vitals:   02/12/17 1521 02/12/17 2008 02/13/17 0019 02/13/17 0502  BP: 123/61 (!) 164/72  (!) 141/70  Pulse: 97 (!) 106  83  Resp: 18 20  18   Temp: 98.3 F (36.8 C) 98.1 F (36.7 C)  97.8 F (36.6 C)  TempSrc: Axillary Oral  Axillary  SpO2: 95% 92%  94%  Weight:   28.6 kg (63 lb)    No intake or output data in the 24 hours ending 02/13/17 1010 Filed Weights   02/11/17 0432 02/13/17 0019  Weight: 44.9 kg (99 lb) 28.6 kg (63 lb)   Exam  General: Well developed, thin, elderly, no distress  HEENT: NCAT, mucous membranes moist.   Cardiovascular: S1 S2 auscultated, RRR, no murmurs appreciated  Respiratory: Clear to auscultation bilaterally with equal chest rise, no wheezing appreicatetd  Abdomen: Soft, nontender, nondistended, + bowel sounds  Extremities: warm dry without cyanosis clubbing or edema  Neuro: AAOx1 (self), hard of hearing. Moves all extremities.  nonfocal  Psych: Appropriate  Data Reviewed: I have personally reviewed following labs and imaging studies  CBC:  Recent Labs Lab 02/10/17 2152 02/11/17 0037 02/12/17 0633 02/13/17 0618  WBC 11.0* 6.7 10.4 8.3  HGB 9.2* 11.4* 9.1* 9.0*  HCT 29.2* 36.6 28.6* 28.9*  MCV 91.8 90.8 90.8 90.3  PLT 347 203 327 932   Basic Metabolic Panel:  Recent Labs Lab 02/10/17 2152 02/11/17 0037 02/11/17 1402 02/12/17 0633 02/13/17 0618  NA 140 140  --  136 138  K 3.1* 3.3*  --  4.5 3.9  CL 103 105  --  101 105  CO2 27 26  --  28 28  GLUCOSE 56* 166*  --  154* 143*  BUN 10 8  --  <5* <5*  CREATININE 0.66 0.61   --  0.61 0.55  CALCIUM 8.7* 9.0  --  8.6* 8.7*  MG  --   --  1.2*  --   --    GFR: Estimated Creatinine Clearance: 20.3 mL/min (by C-G formula based on SCr of 0.55 mg/dL). Liver Function Tests:  Recent Labs Lab 02/10/17 2152  AST 27  ALT 13*  ALKPHOS 91  BILITOT 0.4  PROT 5.6*  ALBUMIN 2.3*   No results for input(s): LIPASE, AMYLASE in the last 168 hours. No results for input(s): AMMONIA in the last 168 hours. Coagulation Profile: No results for input(s): INR, PROTIME in the last 168 hours. Cardiac Enzymes:  Recent Labs Lab 02/11/17 1229 02/11/17 1533 02/11/17 2119 02/12/17 0633  TROPONINI 0.20* 0.07* 0.05* 0.05*   BNP (last 3 results) No results for input(s): PROBNP in the last 8760 hours. HbA1C:  Recent Labs  02/11/17 0037  HGBA1C 7.7*   CBG:  Recent Labs Lab 02/12/17 1804 02/12/17 2003 02/13/17 0109 02/13/17 0505 02/13/17 0816  GLUCAP 148* 194* 133* 122* 137*   Lipid Profile: No results for  input(s): CHOL, HDL, LDLCALC, TRIG, CHOLHDL, LDLDIRECT in the last 72 hours. Thyroid Function Tests:  Recent Labs  02/11/17 0037  TSH 1.627   Anemia Panel: No results for input(s): VITAMINB12, FOLATE, FERRITIN, TIBC, IRON, RETICCTPCT in the last 72 hours. Urine analysis:    Component Value Date/Time   COLORURINE YELLOW 02/11/2017 1059   APPEARANCEUR CLEAR 02/11/2017 1059   LABSPEC 1.015 02/11/2017 1059   PHURINE 6.0 02/11/2017 1059   GLUCOSEU >=500 (A) 02/11/2017 1059   HGBUR NEGATIVE 02/11/2017 1059   BILIRUBINUR NEGATIVE 02/11/2017 1059   BILIRUBINUR neg 12/03/2013 1135   KETONESUR NEGATIVE 02/11/2017 1059   PROTEINUR NEGATIVE 02/11/2017 1059   UROBILINOGEN 0.2 12/03/2013 1135   UROBILINOGEN 1.0 11/14/2013 1325   NITRITE NEGATIVE 02/11/2017 1059   LEUKOCYTESUR NEGATIVE 02/11/2017 1059   Sepsis Labs: @LABRCNTIP (procalcitonin:4,lacticidven:4)  )No results found for this or any previous visit (from the past 240 hour(s)).    Radiology  Studies: No results found.   Scheduled Meds: . atorvastatin  20 mg Oral q1800  . azelastine  2 spray Each Nare BID  . busPIRone  10 mg Oral BID  . cholecalciferol  2,000 Units Oral Daily  . enoxaparin (LOVENOX) injection  40 mg Subcutaneous Q24H  . insulin aspart  0-5 Units Subcutaneous Q4H  . latanoprost  1 drop Both Eyes QHS  . LORazepam  0.25 mg Oral BID  . losartan  50 mg Oral Daily  . metoprolol succinate  25 mg Oral Daily   Continuous Infusions:   LOS: 2 days   Time Spent in minutes   30 minutes  Keon Pender D.O. on 02/13/2017 at 10:10 AM  Between 7am to 7pm - Pager - [phone]  After 7pm go to [email or web address] - password [email or web address]  And look for the night coverage person covering for me after hours  [facility] Office  [phone]

## 2017-02-13 NOTE — NC FL2 (Signed)
[place] MEDICAID FL2 LEVEL OF CARE SCREENING TOOL     IDENTIFICATION  Patient Name: [name] Birthdate: Jul 18, 1923 Sex: female Admission Date (Current Location): 02/10/2017  [facility] and [place] Number:  Herbalist and Address:  [facility] [place], [place] [address] [place],  [address]      Provider Number: [id number]  Attending Physician Name and Address:  Cristal Ford, DO  Relative Name and Phone Number:  [name], daughter, [phone]    Current Level of Care: Hospital Recommended Level of Care:   Prior Approval Number:    Date Approved/Denied:   PASRR Number:    Discharge Plan:      Current Diagnoses: Patient Active Problem List   Diagnosis Date Noted  . Hypokalemia 02/10/2017  . Lethargy 02/10/2017  . Normocytic anemia 02/10/2017  . Hypoglycemia due to insulin 02/10/2017  . Hypoglycemia 02/10/2017  . Agitated 01/19/2017  . UTI (urinary tract infection) 11/30/2016  . Diabetes mellitus with complication ([place]) 01/74/9449  . Acute lower UTI 11/29/2016  . Acute confusion due to infection   . Acute cystitis without hematuria   . Recurrent falls 10/05/2015  . Acute encephalopathy 09/24/2015  . Fever, myalgia, and generalized weakness 09/24/2015  . Hyperlipidemia 06/02/2015  . PMR (polymyalgia rheumatica) (H[facility]) 12/17/2014  . Vertigo 10/12/2014  . Essential hypertension   . Single kidney   . Poorly controlled type 2 diabetes mellitus ([place]) 02/26/2013  . CAD (coronary artery disease) 04/21/2011  . [place] 03/26/2010  . CARPAL TUNNEL SYNDROME 08/04/2009  . UNSTEADY GAIT 01/22/2007  . TEMPORAL ARTERITIS 10/12/2006  . RHINITIS, ALLERGIC 10/12/2006  . OSTEOPOROSIS, UNSPECIFIED 10/12/2006    Orientation RESPIRATION BLADDER Height & Weight      (Disoriented x4)  Normal Incontinent Weight: 63 lb (28.6 kg) Height:     BEHAVIORAL SYMPTOMS/MOOD NEUROLOGICAL BOWEL NUTRITION STATUS      Incontinent Diet (Please  see DC Summary)  AMBULATORY STATUS COMMUNICATION OF NEEDS Skin   Extensive Assist Verbally Normal                       Personal Care Assistance Level of Assistance  Bathing, Feeding, Dressing Bathing Assistance: Maximum assistance Feeding assistance: Limited assistance Dressing Assistance: Limited assistance     Functional Limitations Info             SPECIAL CARE FACTORS FREQUENCY  PT (By licensed PT)     PT Frequency: 5x/week              Contractures      Additional Factors Info  Code Status, Allergies, Psychotropic, Insulin Sliding Scale Code Status Info: [name] Allergies Info: Penicillins, Sulfadiazine Psychotropic Info: Buspar; Ativan Insulin Sliding Scale Info: Every 4 hours       Current Medications (02/13/2017):  This is the current hospital active medication list Current Facility-Administered Medications  Medication Dose Route Frequency Provider Last Rate Last Dose  . acetaminophen (TYLENOL) tablet 500 mg  500 mg Oral Q4H PRN Opyd, Ilene Qua, MD      . atorvastatin (LIPITOR) tablet 20 mg  20 mg Oral q1800 Opyd, Ilene Qua, MD   20 mg at 02/12/17 1806  . azelastine (ASTELIN) 0.1 % nasal spray 2 spray  2 spray Each Nare BID Opyd, Ilene Qua, MD   2 spray at 02/12/17 2213  . busPIRone (BUSPAR) tablet 10 mg  10 mg Oral BID Opyd, Ilene Qua, MD   10 mg at 02/12/17 2213  .  cholecalciferol (VITAMIN D) tablet 2,000 Units  2,000 Units Oral Daily Opyd, Ilene Qua, MD   2,000 Units at 02/12/17 1303  . enoxaparin (LOVENOX) injection 40 mg  40 mg Subcutaneous Q24H Opyd, Ilene Qua, MD   40 mg at 02/13/17 1007  . haloperidol lactate (HALDOL) injection 1 mg  1 mg Intravenous Q6H PRN Opyd, Ilene Qua, MD   1 mg at 02/13/17 0528  . HYDROcodone-acetaminophen (NORCO/VICODIN) 5-325 MG per tablet 1 tablet  1 tablet Oral Daily PRN Opyd, Ilene Qua, MD      . insulin aspart (novoLOG) injection 0-5 Units  0-5 Units Subcutaneous Q4H Opyd, Ilene Qua, MD   2 Units at 02/11/17 [phone]  .  latanoprost (XALATAN) 0.005 % ophthalmic solution 1 drop  1 drop Both Eyes QHS Opyd, Ilene Qua, MD   1 drop at 02/12/17 2213  . LORazepam (ATIVAN) injection 0.5 mg  0.5 mg Intravenous Q6H PRN Opyd, Ilene Qua, MD   0.5 mg at 02/11/17 0132  . LORazepam (ATIVAN) tablet 0.25 mg  0.25 mg Oral BID Opyd, Ilene Qua, MD   0.25 mg at 02/13/17 1018  . losartan (COZAAR) tablet 50 mg  50 mg Oral Daily Opyd, Ilene Qua, MD   50 mg at 02/13/17 1010  . metoprolol succinate (TOPROL-XL) 24 hr tablet 25 mg  25 mg Oral Daily Opyd, Ilene Qua, MD   25 mg at 02/13/17 1012  . ondansetron (ZOFRAN) tablet 4 mg  4 mg Oral Q6H PRN Opyd, Ilene Qua, MD       Or  . ondansetron (ZOFRAN) injection 4 mg  4 mg Intravenous Q6H PRN Opyd, Ilene Qua, MD         Discharge Medications: Please see discharge summary for a list of discharge medications.  Relevant Imaging Results:  Relevant Lab Results:   Additional Information SSN: [id number] [place], [place]

## 2017-02-13 NOTE — Progress Notes (Signed)
Occupational Therapy Evaluation   Pt admitted with the below listed diagnosis and demonstrates the below deficits.  She has h/o dementia, and is severely HOH.  She requires mod - max A for ADLs and max A +2 for functional transfers as she demonstrates a significant posterior lean.  She was previously residing in ALF, but feel she would benefit from SNF level rehab at discharge.   Recommend SNF.   02/13/17 1700  OT Visit Information  Last OT Received On 02/13/17  Assistance Needed +2  History of Present Illness Pt is a 83 yo female admitted through ED from ALF on 02/10/17 for hypoglycemia with BG at 12. Pt was given glucose in transit and increased to 2014 then immediately dropped to 54. Pt diagnosed with hypoglycemia, acute encephalopathy, and hypokalemia. PMH significant for DM2, HTN, CAD, normocytic anemia, dementia.  Precautions  Precautions Fall  Home Living  Family/patient expects to be discharged to: Skilled nursing facility  Additional Comments Pt has been residing in ALF   Prior Function  Level of [place] Needs assistance  Communication / Swallowing Assistance Needed very HOH  Comments no family present to provide info   Communication  Communication HOH  Pain Assessment  Pain Assessment Faces  Faces Pain Scale 0  Cognition  Arousal/Alertness Awake/alert  Behavior During Therapy Restless;Impulsive  Overall Cognitive Status No family/caregiver present to determine baseline cognitive functioning  General Comments Pt with h/o dementia and is very HOH   Upper Extremity Assessment  Upper Extremity Assessment Overall WFL for tasks assessed  Lower Extremity Assessment  Lower Extremity Assessment Defer to PT evaluation  Cervical / Trunk Assessment  Cervical / Trunk Assessment Kyphotic  ADL  Overall ADL's  Needs assistance/impaired  Eating/Feeding Set up;Supervision/ safety;Sitting;Bed level  Grooming Wash/dry hands;Wash/dry face;Oral care;Brushing hair;Minimal  assistance;Sitting  Upper Body Bathing Minimal assistance;Sitting  Lower Body Bathing Maximal assistance;Sit to/from stand  Upper Body Dressing  Moderate assistance;Sitting  Lower Body Dressing Maximal assistance;Sit to/from stand  Lower Body Dressing Details (indicate cue type and reason) Pt requires mod A to don socks.  Max A to move sit to Geographical information systems officer for physical assistance;Stand-pivot;BSC  Toilet Transfer Details (indicate cue type and reason) Pt with heavy posterior lean   Toileting- Clothing Manipulation and Hygiene Total assistance;Sit to/from stand  Functional mobility during ADLs Maximal assistance;+2 for physical assistance  Bed Mobility  Overal bed mobility Needs Assistance  Bed Mobility Supine to Sit;Sit to Supine  Supine to sit Min guard  Sit to supine Min guard  General bed mobility comments pt moves self to EOB with legs between bedrails, and returned herself to bed spontaneously when she was fatigued   Transfers  Overall transfer level Needs assistance  Equipment used 1 person hand held assist  Transfers Sit to/from Stand  Sit to Stand Max assist  General transfer comment Pt requires assist to lift buttocks from bed  and demonstrates a significant posterior lean   Balance  Overall balance assessment Needs assistance  Sitting-balance support Feet supported;No upper extremity supported  Sitting balance-Leahy Scale Poor  Sitting balance - Comments Pt with LOB posteriorly when attempting to don socks   Postural control Posterior lean  Standing balance support Bilateral upper extremity supported  Standing balance-Leahy Scale Poor  Standing balance comment requires max A to maintain standing   OT - End of Session  Activity Tolerance Patient tolerated treatment well  Patient left in bed;with call bell/phone within reach;with bed alarm set;with nursing/sitter in  room  Nurse Communication Mobility status  OT Assessment  OT  Recommendation/Assessment Patient needs continued OT Services  OT Visit Diagnosis Unsteadiness on feet (R26.81);Cognitive communication deficit (R41.841)  OT Problem List Decreased strength;Decreased activity tolerance;Impaired balance (sitting and/or standing);Decreased cognition;Decreased safety awareness;Decreased knowledge of use of DME or AE  Barriers to Discharge Decreased caregiver support  OT Plan  OT Frequency (ACUTE ONLY) Min 2X/week  OT Treatment/Interventions (ACUTE ONLY) Self-care/ADL training;Therapeutic exercise;DME and/or AE instruction;Therapeutic activities;Cognitive remediation/compensation;Patient/family education;Balance training  AM-PAC OT "6 Clicks" Daily Activity Outcome Measure  Help from another person eating meals? 3  Help from another person taking care of personal grooming? 3  Help from another person toileting, which includes using toliet, bedpan, or urinal? 2  Help from another person bathing (including washing, rinsing, drying)? 2  Help from another person to put on and taking off regular upper body clothing? 2  Help from another person to put on and taking off regular lower body clothing? 2  6 Click Score 14  ADL G Code Conversion CK  OT Recommendation  Follow Up Recommendations SNF  OT Equipment None recommended by OT  Individuals Consulted  Consulted and Agree with Results and Recommendations Patient unable/family or caregiver not available  Acute Rehab OT Goals  OT Goal Formulation Patient unable to participate in goal setting  Time For Goal Achievement 02/27/17  Potential to Achieve Goals Good  OT Time Calculation  OT Start Time (ACUTE ONLY) 1538  OT Stop Time (ACUTE ONLY) 1556  OT Time Calculation (min) 18 min  OT General Charges  $OT Visit 1 Procedure  OT Evaluation  $OT Eval Moderate Complexity 1 Procedure  Written Expression  Dominant Hand Right  Lucille Passy, OTR/L [phone]

## 2017-02-13 NOTE — Progress Notes (Addendum)
Initial Nutrition Assessment  DOCUMENTATION CODES:   Severe malnutrition in context of chronic illness, Underweight  INTERVENTION:   -Ensure Enlive po BID, each supplement provides 350 kcal and 20 grams of protein -30 ml Prostat daily -MVI daily  NUTRITION DIAGNOSIS:   Malnutrition (Severe) related to chronic illness (dementia) as evidenced by severe depletion of body fat, severe depletion of muscle mass, percent weight loss.   GOAL:   Patient will meet greater than or equal to 90% of their needs  MONITOR:   PO intake, Supplement acceptance, Labs, Weight trends, Skin, I & O's  REASON FOR ASSESSMENT:   Consult Assessment of nutrition requirement/status  ASSESSMENT:   [name] is a 83 y.o. female with medical history significant for type 2 diabetes mellitus, hypertension, CAD, and chronic normocytic anemia, now presenting to the emergency department from her SNF after being found unresponsive by staff. Per the report of SNF personnel, patient was in her usual state around 5 PM, was given 6 units of NovoLog per her sliding scale, did not eat much of her dinner, and retired to her quarters where she was found unresponsive just prior to arrival  Pt admitted with hypoglycemia.   Per H&P, pt has been falling at ALF, as well as experiencing wt loss and poor PO's. Pt is extremely hard of hearing and her hearing aids no longer fit her secondary to weight loss.   Pt very somnolent at time of visit and did not participate in interview. Intake has been poor; PO 30%.   Reviewed wt records; noted wt discrepancy (66# vs 99#). RD suspects wt is closer to 99# given physical appearance of pt. Noted pt has experienced a 10.8% wt loss over the past 3 months, which is significant for time frame.   Reviewed ALF records; notable medications include novolog sliding scale, vitamin D3, and 750 mg metformin q AM.   Nutrition-Focused physical exam completed. Findings are severe fat depletion,  severe muscle depletion, and no edema.   Labs reviewed: CBGS: 122-199.   Diet Order:  Diet regular Room service appropriate? Yes; Fluid consistency: Thin  Skin:  Reviewed, no issues  Last BM:  02/11/17  Height:   Ht Readings from Last 1 Encounters:  11/29/16 5\' 2"  (1.575 m)    Weight:   Wt Readings from Last 1 Encounters:  02/13/17 63 lb (28.6 kg)    Ideal Body Weight:  50 kg  BMI:  Body mass index is 11.52 kg/m.  Estimated Nutritional Needs:   Kcal:  1000-1200  Protein:  45-60 grams  Fluid:  > 1 L  EDUCATION NEEDS:   Education needs no appropriate at this time  Meric Joye A. Jimmye Norman, RD, LDN, CDE Pager: [phone] After hours Pager: [phone]

## 2017-02-14 DIAGNOSIS — E119 Type 2 diabetes mellitus without complications: Secondary | ICD-10-CM | POA: Diagnosis not present

## 2017-02-14 DIAGNOSIS — S6292XD Unspecified fracture of left wrist and hand, subsequent encounter for fracture with routine healing: Secondary | ICD-10-CM | POA: Diagnosis not present

## 2017-02-14 DIAGNOSIS — D688 Other specified coagulation defects: Secondary | ICD-10-CM | POA: Diagnosis not present

## 2017-02-14 DIAGNOSIS — R600 Localized edema: Secondary | ICD-10-CM | POA: Diagnosis not present

## 2017-02-14 DIAGNOSIS — D518 Other vitamin B12 deficiency anemias: Secondary | ICD-10-CM | POA: Diagnosis not present

## 2017-02-14 DIAGNOSIS — Q600 Renal agenesis, unilateral: Secondary | ICD-10-CM | POA: Diagnosis not present

## 2017-02-14 DIAGNOSIS — D649 Anemia, unspecified: Secondary | ICD-10-CM | POA: Diagnosis not present

## 2017-02-14 DIAGNOSIS — J309 Allergic rhinitis, unspecified: Secondary | ICD-10-CM | POA: Diagnosis not present

## 2017-02-14 DIAGNOSIS — H8149 Vertigo of central origin, unspecified ear: Secondary | ICD-10-CM | POA: Diagnosis not present

## 2017-02-14 DIAGNOSIS — M6281 Muscle weakness (generalized): Secondary | ICD-10-CM | POA: Diagnosis not present

## 2017-02-14 DIAGNOSIS — M722 Plantar fascial fibromatosis: Secondary | ICD-10-CM | POA: Diagnosis not present

## 2017-02-14 DIAGNOSIS — R627 Adult failure to thrive: Secondary | ICD-10-CM | POA: Diagnosis not present

## 2017-02-14 DIAGNOSIS — G5600 Carpal tunnel syndrome, unspecified upper limb: Secondary | ICD-10-CM | POA: Diagnosis not present

## 2017-02-14 DIAGNOSIS — R1312 Dysphagia, oropharyngeal phase: Secondary | ICD-10-CM | POA: Diagnosis not present

## 2017-02-14 DIAGNOSIS — K5900 Constipation, unspecified: Secondary | ICD-10-CM | POA: Diagnosis not present

## 2017-02-14 DIAGNOSIS — T383X5A Adverse effect of insulin and oral hypoglycemic [antidiabetic] drugs, initial encounter: Secondary | ICD-10-CM | POA: Diagnosis not present

## 2017-02-14 DIAGNOSIS — H40009 Preglaucoma, unspecified, unspecified eye: Secondary | ICD-10-CM | POA: Diagnosis not present

## 2017-02-14 DIAGNOSIS — M810 Age-related osteoporosis without current pathological fracture: Secondary | ICD-10-CM | POA: Diagnosis not present

## 2017-02-14 DIAGNOSIS — E1165 Type 2 diabetes mellitus with hyperglycemia: Secondary | ICD-10-CM | POA: Diagnosis not present

## 2017-02-14 DIAGNOSIS — F419 Anxiety disorder, unspecified: Secondary | ICD-10-CM | POA: Diagnosis not present

## 2017-02-14 DIAGNOSIS — J90 Pleural effusion, not elsewhere classified: Secondary | ICD-10-CM | POA: Diagnosis not present

## 2017-02-14 DIAGNOSIS — E785 Hyperlipidemia, unspecified: Secondary | ICD-10-CM | POA: Diagnosis not present

## 2017-02-14 DIAGNOSIS — F0390 Unspecified dementia without behavioral disturbance: Secondary | ICD-10-CM | POA: Diagnosis not present

## 2017-02-14 DIAGNOSIS — R4182 Altered mental status, unspecified: Secondary | ICD-10-CM | POA: Diagnosis not present

## 2017-02-14 DIAGNOSIS — G9340 Encephalopathy, unspecified: Secondary | ICD-10-CM | POA: Diagnosis not present

## 2017-02-14 DIAGNOSIS — E160 Drug-induced hypoglycemia without coma: Secondary | ICD-10-CM | POA: Diagnosis not present

## 2017-02-14 DIAGNOSIS — E43 Unspecified severe protein-calorie malnutrition: Secondary | ICD-10-CM | POA: Diagnosis not present

## 2017-02-14 DIAGNOSIS — E876 Hypokalemia: Secondary | ICD-10-CM | POA: Diagnosis not present

## 2017-02-14 DIAGNOSIS — E039 Hypothyroidism, unspecified: Secondary | ICD-10-CM | POA: Diagnosis not present

## 2017-02-14 DIAGNOSIS — E782 Mixed hyperlipidemia: Secondary | ICD-10-CM | POA: Diagnosis not present

## 2017-02-14 DIAGNOSIS — E7800 Pure hypercholesterolemia, unspecified: Secondary | ICD-10-CM | POA: Diagnosis not present

## 2017-02-14 DIAGNOSIS — I10 Essential (primary) hypertension: Secondary | ICD-10-CM | POA: Diagnosis not present

## 2017-02-14 DIAGNOSIS — E162 Hypoglycemia, unspecified: Secondary | ICD-10-CM | POA: Diagnosis not present

## 2017-02-14 DIAGNOSIS — Z79899 Other long term (current) drug therapy: Secondary | ICD-10-CM | POA: Diagnosis not present

## 2017-02-14 DIAGNOSIS — S52502D Unspecified fracture of the lower end of left radius, subsequent encounter for closed fracture with routine healing: Secondary | ICD-10-CM | POA: Diagnosis not present

## 2017-02-14 DIAGNOSIS — I2510 Atherosclerotic heart disease of native coronary artery without angina pectoris: Secondary | ICD-10-CM | POA: Diagnosis not present

## 2017-02-14 DIAGNOSIS — M316 Other giant cell arteritis: Secondary | ICD-10-CM | POA: Diagnosis not present

## 2017-02-14 LAB — GLUCOSE, CAPILLARY
GLUCOSE-CAPILLARY: 135 mg/dL — AB (ref 65–99)
GLUCOSE-CAPILLARY: 141 mg/dL — AB (ref 65–99)
GLUCOSE-CAPILLARY: 142 mg/dL — AB (ref 65–99)
GLUCOSE-CAPILLARY: 149 mg/dL — AB (ref 65–99)
Glucose-Capillary: 145 mg/dL — ABNORMAL HIGH (ref 65–99)

## 2017-02-14 LAB — CBC
HCT: 30.4 % — ABNORMAL LOW (ref 36.0–46.0)
HEMOGLOBIN: 9.4 g/dL — AB (ref 12.0–15.0)
MCH: 28.1 pg (ref 26.0–34.0)
MCHC: 30.9 g/dL (ref 30.0–36.0)
MCV: 90.7 fL (ref 78.0–100.0)
Platelets: 340 10*3/uL (ref 150–400)
RBC: 3.35 MIL/uL — AB (ref 3.87–5.11)
RDW: 14.5 % (ref 11.5–15.5)
WBC: 7.5 10*3/uL (ref 4.0–10.5)

## 2017-02-14 MED ORDER — HYDROCODONE-ACETAMINOPHEN 5-325 MG PO TABS: 1.0000 | ORAL_TABLET | Freq: Every day | ORAL | 0 refills | Status: DC | PRN

## 2017-02-14 MED ORDER — LORAZEPAM 0.5 MG PO TABS: 0.2500 mg | ORAL_TABLET | Freq: Two times a day (BID) | ORAL | 0 refills | Status: DC

## 2017-02-14 MED ORDER — PRO-STAT SUGAR FREE PO LIQD: 30.0000 mL | Freq: Every day | ORAL | 0 refills | Status: DC

## 2017-02-14 MED ORDER — ADULT MULTIVITAMIN W/MINERALS CH: 1.0000 | ORAL_TABLET | Freq: Every day | ORAL | Status: DC

## 2017-02-14 MED ORDER — ENSURE ENLIVE PO LIQD: 237.0000 mL | Freq: Two times a day (BID) | ORAL | 12 refills | Status: DC

## 2017-02-14 MED ORDER — INSULIN ASPART 100 UNIT/ML ~~LOC~~ SOLN: 0.0000 [IU] | Freq: Three times a day (TID) | SUBCUTANEOUS | 11 refills | Status: DC

## 2017-02-14 NOTE — Progress Notes (Signed)
Patient will DC to: [facility] [place] Anticipated DC date: 02/14/17 Family notified: Juanda Crumble Transport by: Corey Harold (2 hours behind)   Per MD patient ready for DC to [facility]. RN, patient, patient's family, and facility notified of DC. Discharge Summary sent to facility. RN given number for report [phone]). DC packet on chart. Ambulance transport requested for patient.   CSW signing off.  Cedric Fishman, [place] Social Worker [phone]

## 2017-02-14 NOTE — Clinical Social Work Placement (Signed)
   CLINICAL SOCIAL WORK PLACEMENT  NOTE  Date:  02/14/2017  Patient Details  Name: [name] MRN: [id number] Date of Birth: [birth year]  Clinical Social Work is seeking post-discharge placement for this patient at the [place] level of care (*CSW will initial, date and re-position this form in  chart as items are completed):  Yes   Patient/family provided with [facility] Work Department's list of facilities offering this level of care within the geographic area requested by the patient (or if unable, by the patient's family).  Yes   Patient/family informed of their freedom to choose among providers that offer the needed level of care, that participate in Medicare, Medicaid or managed care program needed by the patient, have an available bed and are willing to accept the patient.  Yes   Patient/family informed of [facility]'s ownership interest in [facility] and [facility], as well as of the fact that they are under no obligation to receive care at these facilities.  PASRR submitted to EDS on       PASRR number received on       Existing PASRR number confirmed on 02/14/17     FL2 transmitted to all facilities in geographic area requested by pt/family on 02/14/17     FL2 transmitted to all facilities within larger geographic area on       Patient informed that his/her managed care company has contracts with or will negotiate with certain facilities, including the following:        Yes   Patient/family informed of bed offers received.  Patient chooses bed at [facility]     Physician recommends and patient chooses bed at      Patient to be transferred to [facility] on 02/14/17.  Patient to be transferred to facility by [facility]     Patient family notified on 02/14/17 of transfer.  Name of family member notified:  Juanda Crumble     PHYSICIAN       Additional Comment:    _______________________________________________ Benard Halsted, [place] 02/14/2017, 4:37 PM

## 2017-02-14 NOTE — Progress Notes (Signed)
Pt prepared for d/c to SNF. IV d/c'd. Skin intact except as charted in most recent assessments. Vitals are stable. Report called to receiving facility. Pt to be transported by ambulance service. 

## 2017-02-14 NOTE — [place] FL2 (Signed)
[place] MEDICAID FL2 LEVEL OF CARE SCREENING TOOL     IDENTIFICATION  Patient Name: [name] Birthdate: 1922/11/27 Sex: female Admission Date (Current Location): 02/10/2017  [facility] and [place] Number:  Herbalist and Address:  [facility], [place] [address], [place], [place] [address]      Provider Number: [id number]  Attending Physician Name and Address:  Cristal Ford, DO  Relative Name and Phone Number:  [name] daughter, [phone]    Current Level of Care: Hospital Recommended Level of Care: SNF Prior Approval Number:    Date Approved/Denied:   PASRR Number:   [id number] (DOB is [birth year] for pasrr)  Discharge Plan: SNF   Current Diagnoses: Patient Active Problem List   Diagnosis Date Noted  . Protein-calorie malnutrition, severe 02/14/2017  . Hypokalemia 02/10/2017  . Lethargy 02/10/2017  . Normocytic anemia 02/10/2017  . Hypoglycemia due to insulin 02/10/2017  . Hypoglycemia 02/10/2017  . Agitated 01/19/2017  . UTI (urinary tract infection) 11/30/2016  . Diabetes mellitus with complication ([place]) 94/17/4081  . Acute lower UTI 11/29/2016  . Acute confusion due to infection   . Acute cystitis without hematuria   . Recurrent falls 10/05/2015  . Acute encephalopathy 09/24/2015  . Fever, myalgia, and generalized weakness 09/24/2015  . Hyperlipidemia 06/02/2015  . PMR (polymyalgia rheumatica) (HCC) 12/17/2014  . Vertigo 10/12/2014  . Essential hypertension   . Single kidney   . Poorly controlled type 2 diabetes mellitus ([place]) 02/26/2013  . CAD (coronary artery disease) 04/21/2011  . [place] 03/26/2010  . CARPAL TUNNEL SYNDROME 08/04/2009  . UNSTEADY GAIT 01/22/2007  . TEMPORAL ARTERITIS 10/12/2006  . RHINITIS, ALLERGIC 10/12/2006  . OSTEOPOROSIS, UNSPECIFIED 10/12/2006    Orientation RESPIRATION BLADDER Height & Weight      (Disoriented x4)  Normal Incontinent Weight: 47.2 kg (104 lb) Height:      BEHAVIORAL SYMPTOMS/MOOD NEUROLOGICAL BOWEL NUTRITION STATUS      Incontinent Diet (Please see DC Summary)  AMBULATORY STATUS COMMUNICATION OF NEEDS Skin   Extensive Assist Verbally Normal                       Personal Care Assistance Level of Assistance  Bathing, Feeding, Dressing Bathing Assistance: Maximum assistance Feeding assistance: Limited assistance Dressing Assistance: Limited assistance     Functional Limitations Info             SPECIAL CARE FACTORS FREQUENCY  PT (By licensed PT)     PT Frequency: 5x/week              Contractures      Additional Factors Info  Code Status, Allergies, Psychotropic, Insulin Sliding Scale Code Status Info: [name] Allergies Info: Penicillins, Sulfadiazine Psychotropic Info: Buspar; Ativan Insulin Sliding Scale Info: Every 4 hours       Current Medications (02/14/2017):  This is the current hospital active medication list Current Facility-Administered Medications  Medication Dose Route Frequency Provider Last Rate Last Dose  . acetaminophen (TYLENOL) tablet 500 mg  500 mg Oral Q4H PRN Opyd, Ilene Qua, MD      . atorvastatin (LIPITOR) tablet 20 mg  20 mg Oral q1800 Opyd, Ilene Qua, MD   20 mg at 02/12/17 1806  . azelastine (ASTELIN) 0.1 % nasal spray 2 spray  2 spray Each Nare BID Opyd, Ilene Qua, MD   2 spray at 02/13/17 2200  . busPIRone (BUSPAR) tablet 10 mg  10 mg Oral BID Opyd, Ilene Qua, MD  10 mg at 02/13/17 2159  . cholecalciferol (VITAMIN D) tablet 2,000 Units  2,000 Units Oral Daily Opyd, Ilene Qua, MD   2,000 Units at 02/13/17 1323  . enoxaparin (LOVENOX) injection 40 mg  40 mg Subcutaneous Q24H Opyd, Ilene Qua, MD   40 mg at 02/13/17 1007  . feeding supplement (ENSURE ENLIVE) (ENSURE ENLIVE) liquid 237 mL  237 mL Oral BID BM Mikhail, Maryann, DO      . feeding supplement (PRO-STAT SUGAR FREE 64) liquid 30 mL  30 mL Oral Daily Mikhail, Maryann, DO      . haloperidol lactate (HALDOL) injection 1 mg  1 mg  Intravenous Q6H PRN Opyd, Ilene Qua, MD   1 mg at 02/14/17 0424  . HYDROcodone-acetaminophen (NORCO/VICODIN) 5-325 MG per tablet 1 tablet  1 tablet Oral Daily PRN Opyd, Ilene Qua, MD      . insulin aspart (novoLOG) injection 0-5 Units  0-5 Units Subcutaneous Q4H Opyd, Ilene Qua, MD   2 Units at 02/11/17 [phone]  . latanoprost (XALATAN) 0.005 % ophthalmic solution 1 drop  1 drop Both Eyes QHS Opyd, Ilene Qua, MD   1 drop at 02/13/17 2159  . LORazepam (ATIVAN) injection 0.5 mg  0.5 mg Intravenous Q6H PRN Opyd, Ilene Qua, MD   0.5 mg at 02/14/17 0755  . LORazepam (ATIVAN) tablet 0.25 mg  0.25 mg Oral BID Opyd, Ilene Qua, MD   0.25 mg at 02/13/17 2159  . losartan (COZAAR) tablet 50 mg  50 mg Oral Daily Opyd, Ilene Qua, MD   50 mg at 02/13/17 1010  . metoprolol succinate (TOPROL-XL) 24 hr tablet 25 mg  25 mg Oral Daily Opyd, Ilene Qua, MD   25 mg at 02/13/17 1012  . multivitamin with minerals tablet 1 tablet  1 tablet Oral Daily Cristal Ford, DO   1 tablet at 02/13/17 1630  . ondansetron (ZOFRAN) tablet 4 mg  4 mg Oral Q6H PRN Opyd, Ilene Qua, MD       Or  . ondansetron (ZOFRAN) injection 4 mg  4 mg Intravenous Q6H PRN Opyd, Ilene Qua, MD         Discharge Medications: Please see discharge summary for a list of discharge medications.  Relevant Imaging Results:  Relevant Lab Results:   Additional Information SSN: [place], [place]

## 2017-02-14 NOTE — Clinical Social Work Note (Signed)
Clinical Social Work Assessment  Patient Details  Name: [name] MRN: [id number] Date of Birth: Nov 19, 1922  Date of referral:  02/14/17               Reason for consult:  Discharge Planning                Permission sought to share information with:  Facility Sport and exercise psychologist, Family Supports Permission granted to share information::  No  Name::     Joel/Lisa  Agency::     Relationship::  SNFs/[place] [employer] Information:     Housing/Transportation Living arrangements for the past 2 months:  [place] of Information:  Adult Children Patient Interpreter Needed:  None Criminal Activity/Legal Involvement Pertinent to Current Situation/Hospitalization:  No - Comment as needed Significant Relationships:  Adult Children Lives with:  Facility Resident Do you feel safe going back to the place where you live?  Yes Need for family participation in patient care:  Yes (Comment)  Care giving concerns:  CSW received consult for possible SNF placement at time of discharge. Patient is disoriented. CSW spoke with patient's son, Fara Olden, and his significant other, Lattie Haw. They are unsure if patient should return to [facility] versus going to SNF for rehab. Patient is currently under Hospice service at the ALF and is not currently responding to commands. Family is concerned over cost of SNF and patient's ability to participate in therapy. CSW to continue to follow and assist with discharge planning needs.   Social Worker assessment / plan:  CSW spoke with patient's son concerning possibility of rehab at [facility] versus returning to ALF. ALF is reviewing clinical to see if they can accept patient back.   Employment status:  Retired Forensic scientist:  Medicare PT Recommendations:  [place] / Referral to community resources:  [place]  Patient/Family's Response to care:  Patient's family would like for patient to go  wherever is best for her. They have paid for her ALF bed through the end of the month.   Patient/Family's Understanding of and Emotional Response to Diagnosis, Current Treatment, and Prognosis:  Patient/family is realistic regarding therapy needs and expressed being hopeful for SNF placement. Patient's son expressed understanding of CSW role and discharge process as well as patient's medical condition. No questions/concerns about plan or treatment.    Emotional Assessment Appearance:  Appears stated age Attitude/Demeanor/Rapport:  Unable to Assess Affect (typically observed):  Unable to Assess Orientation:  Oriented to Self, Oriented to Place, Oriented to  Time, Oriented to Situation Alcohol / Substance use:  Not Applicable Psych involvement (Current and /or in the community):  No (Comment)  Discharge Needs  Concerns to be addressed:  Care Coordination Readmission within the last 30 days:  Yes Current discharge risk:  None Barriers to Discharge:  No Barriers Identified   Benard Halsted, LCSWA 02/14/2017, 10:14 AM

## 2017-02-14 NOTE — Discharge Instructions (Signed)
Hypoglycemia Hypoglycemia occurs when the level of sugar (glucose) in the blood is too low. Glucose is a type of sugar that provides the body's main source of energy. Certain hormones (insulin and glucagon) control the level of glucose in the blood. Insulin lowers blood glucose, and glucagon increases blood glucose. Hypoglycemia can result from having too much insulin in the bloodstream, or from not eating enough food that contains glucose. Hypoglycemia can happen in people who do or do not have diabetes. It can develop quickly, and it can be a medical emergency. What are the causes? Hypoglycemia occurs most often in people who have diabetes. If you have diabetes, hypoglycemia may be caused by:  Diabetes medicine.  Not eating enough, or not eating often enough.  Increased physical activity.  Drinking alcohol, especially when you have not eaten recently.  If you do not have diabetes, hypoglycemia may be caused by:  A tumor in the pancreas. The pancreas is the organ that makes insulin.  Not eating enough, or not eating for long periods at a time (fasting).  Severe infection or illness that affects the liver, heart, or kidneys.  Certain medicines.  You may also have reactive hypoglycemia. This condition causes hypoglycemia within 4 hours of eating a meal. This may occur after having stomach surgery. Sometimes, the cause of reactive hypoglycemia is not known. What increases the risk? Hypoglycemia is more likely to develop in:  People who have diabetes and take medicines to lower blood glucose.  People who abuse alcohol.  People who have a severe illness.  What are the signs or symptoms? Hypoglycemia may not cause any symptoms. If you have symptoms, they may include:  Hunger.  Anxiety.  Sweating and feeling clammy.  Confusion.  Dizziness or feeling light-headed.  Sleepiness.  Nausea.  Increased heart rate.  Headache.  Blurry  vision.  Seizure.  Nightmares.  Tingling or numbness around the mouth, lips, or tongue.  A change in speech.  Decreased ability to concentrate.  A change in coordination.  Restless sleep.  Tremors or shakes.  Fainting.  Irritability.  How is this diagnosed? Hypoglycemia is diagnosed with a blood test to measure your blood glucose level. This blood test is done while you are having symptoms. Your health care provider may also do a physical exam and review your medical history. If you do not have diabetes, other tests may be done to find the cause of your hypoglycemia. How is this treated? This condition can often be treated by immediately eating or drinking something that contains glucose, such as:  3-4 sugar tablets (glucose pills).  Glucose gel, 15-gram tube.  Fruit juice, 4 oz (120 mL).  Regular soda (not diet soda), 4 oz (120 mL).  Low-fat milk, 4 oz (120 mL).  Several pieces of hard candy.  Sugar or honey, 1 Tbsp.  Treating Hypoglycemia If You Have Diabetes  If you are alert and able to swallow safely, follow the 15:15 rule:  Take 15 grams of a rapid-acting carbohydrate. Rapid-acting options include: ? 1 tube of glucose gel. ? 3 glucose pills. ? 6-8 pieces of hard candy. ? 4 oz (120 mL) of fruit juice. ? 4 oz (120 ml) of regular (not diet) soda.  Check your blood glucose 15 minutes after you take the carbohydrate.  If the repeat blood glucose level is still at or below 70 mg/dL (3.9 mmol/L), take 15 grams of a carbohydrate again.  If your blood glucose level does not increase above 70 mg/dL (3.9 mmol/L)  after 3 tries, seek emergency medical care.  After your blood glucose level returns to normal, eat a meal or a snack within 1 hour.  Treating Severe Hypoglycemia Severe hypoglycemia is when your blood glucose level is at or below 54 mg/dL (3 mmol/L). Severe hypoglycemia is an emergency. Do not wait to see if the symptoms will go away. Get medical help  right away. Call your local emergency services (911 in the [place] Do not drive yourself to the hospital. If you have severe hypoglycemia and you cannot eat or drink, you may need an injection of glucagon. A family member or close friend should learn how to check your blood glucose and how to give you a glucagon injection. Ask your health care provider if you need to have an emergency glucagon injection kit available. Severe hypoglycemia may need to be treated in a hospital. The treatment may include getting glucose through an IV tube. You may also need treatment for the cause of your hypoglycemia. Follow these instructions at home: General instructions  Avoid any diets that cause you to not eat enough food. Talk with your health care provider before you start any new diet.  Take over-the-counter and prescription medicines only as told by your health care provider.  Limit alcohol intake to no more than 1 drink per day for nonpregnant women and 2 drinks per day for men. One drink equals 12 oz of beer, 5 oz of wine, or 1 oz of hard liquor.  Keep all follow-up visits as told by your health care provider. This is important. If You Have Diabetes:   Make sure you know the symptoms of hypoglycemia.  Always have a rapid-acting carbohydrate snack with you to treat low blood sugar.  Follow your diabetes management plan, as told by your health care provider. Make sure you: ? Take your medicines as directed. ? Follow your exercise plan. ? Follow your meal plan. Eat on time, and do not skip meals. ? Check your blood glucose as often as directed. Make sure to check your blood glucose before and after exercise. If you exercise longer or in a different way than usual, check your blood glucose more often. ? Follow your sick day plan whenever you cannot eat or drink normally. Make this plan in advance with your health care provider.  Share your diabetes management plan with people in your workplace, school,  and household.  Check your urine for ketones when you are ill and as told by your health care provider.  Carry a medical alert card or wear medical alert jewelry. If You Have Reactive Hypoglycemia or Low Blood Sugar From Other Causes:  Monitor your blood glucose as told by your health care provider.  Follow instructions from your health care provider about eating or drinking restrictions. Contact a health care provider if:  You have problems keeping your blood glucose in your target range.  You have frequent episodes of hypoglycemia. Get help right away if:  You continue to have hypoglycemia symptoms after eating or drinking something containing glucose.  Your blood glucose is at or below 54 mg/dL (3 mmol/L).  You have a seizure.  You faint. These symptoms may represent a serious problem that is an emergency. Do not wait to see if the symptoms will go away. Get medical help right away. Call your local emergency services (911 in the [place] Do not drive yourself to the hospital. This information is not intended to replace advice given to you by your health care  frequent episodes of hypoglycemia.  Get help right away if:   You continue to have hypoglycemia symptoms after eating or drinking something containing glucose.   Your blood glucose is at or below 54 mg/dL (3 mmol/L).   You have a seizure.   You faint.  These symptoms may represent a serious problem that is an emergency. Do not wait to see if the symptoms will go away. Get medical help right away. Call your local emergency services (911 in the [place] Do not drive yourself to the hospital.  This information is not intended to replace advice given to you by your health care provider. Make sure you discuss any questions you have with your health care provider.  Document Released: 08/01/2005 Document Revised: 01/13/2016 Document Reviewed: 09/04/2015  Elsevier Interactive Patient Education  2018 [employer]

## 2017-02-14 NOTE — Progress Notes (Signed)
[facility] reviewing FL2 to see if they are able to accept patient back. Patient is currently on Hospice and family would have to pay more privately for a SNF placement. CSW unsure if goals of therapy are in line with Hospice comfort care.   [name] LCSWA [phone]

## 2017-02-16 DIAGNOSIS — E785 Hyperlipidemia, unspecified: Secondary | ICD-10-CM | POA: Diagnosis not present

## 2017-02-16 DIAGNOSIS — F0390 Unspecified dementia without behavioral disturbance: Secondary | ICD-10-CM | POA: Diagnosis not present

## 2017-02-16 DIAGNOSIS — R1312 Dysphagia, oropharyngeal phase: Secondary | ICD-10-CM | POA: Diagnosis not present

## 2017-02-16 DIAGNOSIS — F419 Anxiety disorder, unspecified: Secondary | ICD-10-CM | POA: Diagnosis not present

## 2017-02-21 DIAGNOSIS — M6281 Muscle weakness (generalized): Secondary | ICD-10-CM | POA: Diagnosis not present

## 2017-02-21 DIAGNOSIS — R1312 Dysphagia, oropharyngeal phase: Secondary | ICD-10-CM | POA: Diagnosis not present

## 2017-02-21 DIAGNOSIS — F0390 Unspecified dementia without behavioral disturbance: Secondary | ICD-10-CM | POA: Diagnosis not present

## 2017-02-21 DIAGNOSIS — R627 Adult failure to thrive: Secondary | ICD-10-CM | POA: Diagnosis not present

## 2017-03-07 DIAGNOSIS — F419 Anxiety disorder, unspecified: Secondary | ICD-10-CM | POA: Diagnosis not present

## 2017-03-07 DIAGNOSIS — R1312 Dysphagia, oropharyngeal phase: Secondary | ICD-10-CM | POA: Diagnosis not present

## 2017-03-07 DIAGNOSIS — R627 Adult failure to thrive: Secondary | ICD-10-CM | POA: Diagnosis not present

## 2017-03-07 DIAGNOSIS — I10 Essential (primary) hypertension: Secondary | ICD-10-CM | POA: Diagnosis not present

## 2017-03-10 ENCOUNTER — Ambulatory Visit (INDEPENDENT_AMBULATORY_CARE_PROVIDER_SITE_OTHER): Payer: Self-pay | Admitting: Orthopaedic Surgery

## 2017-03-15 DIAGNOSIS — S52502D Unspecified fracture of the lower end of left radius, subsequent encounter for closed fracture with routine healing: Secondary | ICD-10-CM | POA: Diagnosis not present

## 2017-03-24 DIAGNOSIS — E785 Hyperlipidemia, unspecified: Secondary | ICD-10-CM | POA: Diagnosis not present

## 2017-03-24 DIAGNOSIS — R1312 Dysphagia, oropharyngeal phase: Secondary | ICD-10-CM | POA: Diagnosis not present

## 2017-03-24 DIAGNOSIS — F0390 Unspecified dementia without behavioral disturbance: Secondary | ICD-10-CM | POA: Diagnosis not present

## 2017-03-24 DIAGNOSIS — F419 Anxiety disorder, unspecified: Secondary | ICD-10-CM | POA: Diagnosis not present

## 2017-04-03 ENCOUNTER — Observation Stay (HOSPITAL_COMMUNITY)
Admission: EM | Admit: 2017-04-03 | Discharge: 2017-04-05 | Disposition: A | Discharge status: Home / Self Care | Payer: Medicare Other | Attending: Internal Medicine | Admitting: Internal Medicine

## 2017-04-03 ENCOUNTER — Encounter (HOSPITAL_COMMUNITY): Payer: Self-pay

## 2017-04-03 ENCOUNTER — Emergency Department (HOSPITAL_COMMUNITY): Payer: Medicare Other

## 2017-04-03 DIAGNOSIS — I252 Old myocardial infarction: Secondary | ICD-10-CM | POA: Insufficient documentation

## 2017-04-03 DIAGNOSIS — Z7189 Other specified counseling: Secondary | ICD-10-CM

## 2017-04-03 DIAGNOSIS — Z79899 Other long term (current) drug therapy: Secondary | ICD-10-CM | POA: Insufficient documentation

## 2017-04-03 DIAGNOSIS — Z794 Long term (current) use of insulin: Secondary | ICD-10-CM | POA: Insufficient documentation

## 2017-04-03 DIAGNOSIS — E11649 Type 2 diabetes mellitus with hypoglycemia without coma: Secondary | ICD-10-CM | POA: Diagnosis not present

## 2017-04-03 DIAGNOSIS — E1165 Type 2 diabetes mellitus with hyperglycemia: Secondary | ICD-10-CM | POA: Insufficient documentation

## 2017-04-03 DIAGNOSIS — I10 Essential (primary) hypertension: Secondary | ICD-10-CM | POA: Diagnosis not present

## 2017-04-03 DIAGNOSIS — E162 Hypoglycemia, unspecified: Secondary | ICD-10-CM

## 2017-04-03 DIAGNOSIS — Z515 Encounter for palliative care: Secondary | ICD-10-CM

## 2017-04-03 DIAGNOSIS — I2510 Atherosclerotic heart disease of native coronary artery without angina pectoris: Secondary | ICD-10-CM | POA: Diagnosis not present

## 2017-04-03 DIAGNOSIS — G9340 Encephalopathy, unspecified: Secondary | ICD-10-CM | POA: Diagnosis present

## 2017-04-03 DIAGNOSIS — R4182 Altered mental status, unspecified: Secondary | ICD-10-CM | POA: Diagnosis not present

## 2017-04-03 DIAGNOSIS — R748 Abnormal levels of other serum enzymes: Secondary | ICD-10-CM

## 2017-04-03 DIAGNOSIS — R402431 Glasgow coma scale score 3-8, in the field [EMT or ambulance]: Secondary | ICD-10-CM | POA: Diagnosis not present

## 2017-04-03 DIAGNOSIS — R061 Stridor: Secondary | ICD-10-CM | POA: Diagnosis not present

## 2017-04-03 DIAGNOSIS — D329 Benign neoplasm of meninges, unspecified: Secondary | ICD-10-CM | POA: Diagnosis not present

## 2017-04-03 DIAGNOSIS — J9811 Atelectasis: Secondary | ICD-10-CM | POA: Diagnosis not present

## 2017-04-03 DIAGNOSIS — R400 Somnolence: Principal | ICD-10-CM

## 2017-04-03 DIAGNOSIS — Z905 Acquired absence of kidney: Secondary | ICD-10-CM | POA: Insufficient documentation

## 2017-04-03 LAB — CBC WITH DIFFERENTIAL/PLATELET
BASOS ABS: 0 10*3/uL (ref 0.0–0.1)
Basophils Relative: 0 %
EOS PCT: 0 %
Eosinophils Absolute: 0 10*3/uL (ref 0.0–0.7)
HCT: 34.8 % — ABNORMAL LOW (ref 36.0–46.0)
Hemoglobin: 11 g/dL — ABNORMAL LOW (ref 12.0–15.0)
LYMPHS PCT: 15 %
Lymphs Abs: 1.3 10*3/uL (ref 0.7–4.0)
MCH: 27.8 pg (ref 26.0–34.0)
MCHC: 31.6 g/dL (ref 30.0–36.0)
MCV: 88.1 fL (ref 78.0–100.0)
MONO ABS: 0.5 10*3/uL (ref 0.1–1.0)
Monocytes Relative: 6 %
Neutro Abs: 6.7 10*3/uL (ref 1.7–7.7)
Neutrophils Relative %: 79 %
PLATELETS: 261 10*3/uL (ref 150–400)
RBC: 3.95 MIL/uL (ref 3.87–5.11)
RDW: 14.9 % (ref 11.5–15.5)
WBC: 8.4 10*3/uL (ref 4.0–10.5)

## 2017-04-03 LAB — CBG MONITORING, ED
GLUCOSE-CAPILLARY: 175 mg/dL — AB (ref 65–99)
Glucose-Capillary: 217 mg/dL — ABNORMAL HIGH (ref 65–99)
Glucose-Capillary: 174 mg/dL — ABNORMAL HIGH (ref 65–99)

## 2017-04-03 LAB — URINALYSIS, ROUTINE W REFLEX MICROSCOPIC
Bilirubin Urine: NEGATIVE
GLUCOSE, UA: 150 mg/dL — AB
Hgb urine dipstick: NEGATIVE
Ketones, ur: NEGATIVE mg/dL
Leukocytes, UA: NEGATIVE
NITRITE: NEGATIVE
PH: 5 (ref 5.0–8.0)
Protein, ur: 30 mg/dL — AB
SPECIFIC GRAVITY, URINE: 1.02 (ref 1.005–1.030)

## 2017-04-03 LAB — COMPREHENSIVE METABOLIC PANEL
ALBUMIN: 2.2 g/dL — AB (ref 3.5–5.0)
ALT: 10 U/L — AB (ref 14–54)
AST: 24 U/L (ref 15–41)
Alkaline Phosphatase: 90 U/L (ref 38–126)
Anion gap: 9 (ref 5–15)
BUN: 19 mg/dL (ref 6–20)
CHLORIDE: 106 mmol/L (ref 101–111)
CO2: 24 mmol/L (ref 22–32)
CREATININE: 0.81 mg/dL (ref 0.44–1.00)
Calcium: 9.8 mg/dL (ref 8.9–10.3)
GFR calc Af Amer: >60 mL/min (ref >60)
Glucose, Bld: 161 mg/dL — ABNORMAL HIGH (ref 65–99)
POTASSIUM: 4 mmol/L (ref 3.5–5.1)
SODIUM: 139 mmol/L (ref 135–145)
Total Bilirubin: 0.5 mg/dL (ref 0.3–1.2)
Total Protein: 6.4 g/dL — ABNORMAL LOW (ref 6.5–8.1)

## 2017-04-03 LAB — I-STAT CG4 LACTIC ACID, ED: LACTIC ACID, VENOUS: 2.43 mmol/L — AB (ref 0.5–1.9)

## 2017-04-03 LAB — TROPONIN I
TROPONIN I: 0.05 ng/mL — AB (ref <0.03)
TROPONIN I: 0.1 ng/mL — AB (ref <0.03)

## 2017-04-03 LAB — RAPID URINE DRUG SCREEN, HOSP PERFORMED
AMPHETAMINES: NOT DETECTED
BENZODIAZEPINES: POSITIVE — AB
Barbiturates: NOT DETECTED
COCAINE: NOT DETECTED
OPIATES: NOT DETECTED
TETRAHYDROCANNABINOL: NOT DETECTED

## 2017-04-03 LAB — BRAIN NATRIURETIC PEPTIDE: B NATRIURETIC PEPTIDE 5: 163 pg/mL — AB (ref 0.0–100.0)

## 2017-04-03 LAB — ETHANOL: Alcohol, Ethyl (B): <5 mg/dL (ref <5)

## 2017-04-03 MED ORDER — DEXTROSE-NACL 5-0.45 % IV SOLN
INTRAVENOUS | Status: DC
  Administered 2017-04-04: 01:00:00 via INTRAVENOUS

## 2017-04-03 MED ORDER — SODIUM CHLORIDE 0.9 % IV SOLN: INTRAVENOUS | Status: DC

## 2017-04-03 MED ORDER — HEPARIN SODIUM (PORCINE) 5000 UNIT/ML IJ SOLN
5000.0000 [IU] | Freq: Three times a day (TID) | INTRAMUSCULAR | Status: DC
  Administered 2017-04-04 (×2): 5000 [IU] via SUBCUTANEOUS
  Filled 2017-04-03 (×2): qty 1

## 2017-04-03 MED ORDER — SODIUM CHLORIDE 0.9 % IV BOLUS (SEPSIS)
500.0000 mL | Freq: Once | INTRAVENOUS | Status: AC
  Administered 2017-04-03: 500 mL via INTRAVENOUS

## 2017-04-03 MED ORDER — DEXTROSE 50 % IV SOLN
INTRAVENOUS | Status: AC
  Filled 2017-04-03: qty 50

## 2017-04-03 MED ORDER — DEXTROSE 50 % IV SOLN
1.0000 | Freq: Once | INTRAVENOUS | Status: AC
  Administered 2017-04-03: 15:00:00 via INTRAVENOUS

## 2017-04-03 NOTE — ED Notes (Signed)
CRITICAL VALUE ALERT  Critical Value:  Troponin 0.05  Date & Time Notied:  04/03/17 [id number]  Provider Notified: Lita Mains  Orders Received/Actions taken: MD made aware

## 2017-04-03 NOTE — ED Notes (Signed)
Updated son on admission

## 2017-04-03 NOTE — H&P (Signed)
History and Physical    [name] OAC:[id number] DOB: 01/20/1923 DOA: 04/03/2017  Referring MD/NP/PA: Lita Mains  PCP: Eulas Post, MD  Outpatient Specialists: none  Patient coming from: SNF  Chief Complaint: Encephalopathy, hypogycemia   HPI: [name] is a 83 y.o. female with medical history significant of CAD, BPPV, DM presenting w/ encephalopathy and hypoglycemia. Per report, pt w/ encephalopathy from SNF. Lethargic. Also w/  Blood sugars in 40s. Unclear etiology. Level V caveat in setting of encephalopathy. Patient minimally verbal at baseline. Was also placed on oxygen in the ER setting of transient hypoxia. No reported fevers, vomiting, abdominal pain, diarrhea. Patient noted to have been admitted back in July for similar symptoms including hypoglycemia and acute encephalopathy. Patient given glucagon in route to the ER. ED Course: Presented to ER T 97.2, BP stable. Satting well on RA. Glu 161. Trop 0.05. Head CT stable. CXR w/ trace pleural effusion and cardiomegaly  Review of Systems: Limited ROS in setting of encephalopathy   Past Medical History:  Diagnosis Date  . BPPV (benign paroxysmal positional vertigo)   . CAD (coronary artery disease)    a. Inferior STEMI s/p BMSx2 to RCA. Coronary anomaly with single coronary ostium arising from right sinus of Valsalva and likely LM course between PA and aorta.  . Carpal tunnel syndrome 08/04/2009  . Diabetes mellitus ([place])   . Essential hypertension   . [place], [place] 03/26/2010  . INCONTINENCE, STRESS, FEMALE 10/12/2006  . Inferior MI ([place]) 04/21/2011  . 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 H/o Left eye hemianopsia (longstanding).  . Kidney stone   . Multinodular goiter   . Osteoporosis, unspecified 10/12/2006  . RHINITIS, ALLERGIC 10/12/2006  . Single kidney    a. Absence of right kidney by prior imaging.  . TEMPORAL ARTERITIS 10/12/2006  . UNSTEADY GAIT 01/22/2007    Past Surgical History:  Procedure  Laterality Date  . ABDOMINAL HYSTERECTOMY    . CATARACT EXTRACTION, BILATERAL    . CORONARY ANGIOPLASTY       reports that she has never smoked. She has never used smokeless tobacco. She reports that she does not drink alcohol or use drugs.  Allergies  Allergen Reactions  . Penicillins Hives and Other (See Comments)    Has patient had a PCN reaction causing immediate rash, facial/tongue/throat swelling, SOB or lightheadedness with hypotension: Yes Has patient had a PCN reaction causing severe rash involving mucus membranes or skin necrosis: Unk Has patient had a PCN reaction that required hospitalization: Unk Has patient had a PCN reaction occurring within the last 10 years: Unk If all of the above answers are "NO", then may proceed with Cephalosporin use.  . Sulfadiazine Hives    Family History  Problem Relation Age of Onset  . Heart attack Father   . Stroke Mother      Prior to Admission medications   Medication Sig Start Date End Date Taking? Authorizing Provider  acetaminophen (TYLENOL) 500 MG tablet Take 500 mg by mouth every 4 (four) hours as needed (for headache, doscomfort, or fever of 99.5-101 F).    Yes [provider]  Amino Acids-Protein Hydrolys (FEEDING SUPPLEMENT, PRO-STAT SUGAR FREE 64,) LIQD Take 30 mLs by mouth daily. 02/14/17  Yes Mikhail, Velta Addison, DO  azelastine (ASTELIN) 0.1 % nasal spray USE 2 SPRAYS IN EACH NOSTRILTWICE DAILY 10/03/16  Yes Burchette, Alinda Sierras, MD  bisacodyl (DULCOLAX) 10 MG suppository Place 10 mg rectally as needed for moderate constipation.  Yes [provider]  busPIRone (BUSPAR) 10 MG tablet Take 10 mg by mouth 2 (two) times daily.   Yes [provider]  Cholecalciferol (VITAMIN D) 2000 UNITS CAPS Take 2,000 Units by mouth daily.   Yes [provider]  ferrous sulfate 325 (65 FE) MG tablet Take 325 mg by mouth 2 (two) times daily with a meal.   Yes [provider]  HYDROcodone-acetaminophen  (NORCO/VICODIN) 5-325 MG tablet Take 1 tablet by mouth daily as needed (for pain). 02/14/17  Yes Mikhail, Maryann, DO  insulin aspart (NOVOLOG) 100 UNIT/ML injection Inject 0-5 Units into the skin 4 (four) times daily -  with meals and at bedtime. Sliding scale  CBG 70 - 120: 0 units  CBG 121 - 150: 1 unit,   CBG 151 - 200: 2 units,   CBG 201 - 250: 3 units,   CBG 251 - 300: 5 units,   CBG 301 - 350: 7 units,   CBG 351 - 400: 9 units    CBG > 400: 9 units and notify your MD 02/14/17  Yes Mikhail, Velta Addison, DO  LORazepam (ATIVAN) 0.5 MG tablet Take 0.5 tablets (0.25 mg total) by mouth 2 (two) times daily. 02/14/17  Yes Mikhail, Maryann, DO  losartan (COZAAR) 50 MG tablet TAKE 1 TABLET BY MOUTH EVERY DAY 11/07/16  Yes Burchette, Alinda Sierras, MD  metoprolol succinate (TOPROL-XL) 25 MG 24 hr tablet TAKE 1 TABLET (25 MG TOTAL) BY MOUTH DAILY. 07/04/16  Yes Burchette, Alinda Sierras, MD  Multiple Vitamin (MULTIVITAMIN WITH MINERALS) TABS tablet Take 1 tablet by mouth daily. 02/14/17  Yes Mikhail, Velta Addison, DO  nitroGLYCERIN (NITROSTAT) 0.4 MG SL tablet Place 0.4 mg under the tongue every 5 (five) minutes as needed for chest pain.    Yes [provider]  senna-docusate (SENOKOT-S) 8.6-50 MG tablet Take 1 tablet by mouth 2 (two) times daily.   Yes [provider]  TRAVATAN Z 0.004 % SOLN ophthalmic solution Place 1 drop into both eyes at bedtime.    Yes [provider]  vitamin C (ASCORBIC ACID) 500 MG tablet Take 500 mg by mouth 2 (two) times daily.   Yes [provider]  atorvastatin (LIPITOR) 20 MG tablet TAKE 1 TABLET (20 MG TOTAL) BY MOUTH DAILY. Patient not taking: Reported on 04/03/2017 11/18/16   Eulas Post, MD  blood glucose meter kit and supplies Dispense based on patient and insurance preference. Use up to four times daily as directed. (FOR ICD-9 250.00, 250.01). Please check sugars before meals and at night. Keep a log. 01/21/17   Arrien, Jimmy Picket, MD  feeding  supplement, ENSURE ENLIVE, (ENSURE ENLIVE) LIQD Take 237 mLs by mouth 2 (two) times daily between meals. Patient not taking: Reported on 04/03/2017 02/14/17   Cristal Ford, DO  Insulin Pen Needle (NOVOFINE) 30G X 8 MM MISC Inject 10 each into the skin as needed. 01/21/17   Tawni Millers, MD    Physical Exam: Vitals:   04/03/17 1630 04/03/17 1700 04/03/17 1800 04/03/17 1900  BP: (!) 142/67 132/76 (!) 147/73 (!) 149/76  Pulse: 98 97 (!) 102 92  Resp: (!) 22 (!) 22 17 (!) 23  Temp:      TempSrc:      SpO2: 100% 97% 99% 99%  Weight:          Constitutional: minimally responsive, cachectic  Vitals:   04/03/17 1630 04/03/17 1700 04/03/17 1800 04/03/17 1900  BP: (!) 142/67 132/76 (!) 147/73 (!) 149/76  Pulse: 98 97 (!) 102 92  Resp: (!) 22 (!) 22 17 (!) 23  Temp:      TempSrc:      SpO2: 100% 97% 99% 99%  Weight:       Eyes: PERRL, lids and conjunctivae normal ENMT: Mucous membranes are dry.  Posterior pharynx clear of any exudate or lesions.Normal dentition.  Neck: normal, supple, no masses, no thyromegaly Respiratory: clear to auscultation bilaterally, no wheezing, no crackles. Normal respiratory effort. No accessory muscle use.  Cardiovascular: Regular rate and rhythm, no murmurs / rubs / gallops. No extremity edema. 2+ pedal pulses. No carotid bruits.  Abdomen: no tenderness, no masses palpated. No hepatosplenomegaly. Bowel sounds positive.  Musculoskeletal: no clubbing / cyanosis. No joint deformity upper and lower extremities. Good ROM, no contractures. Normal muscle tone.  Skin: no rashes, lesions, ulcers. No induration Neurologic: minimally responsive to sternal rubbing  Psychiatric:minimally responsive to sternal rubbing   Labs on Admission: I have personally reviewed following labs and imaging studies  CBC:  Recent Labs Lab 04/03/17 1529  WBC 8.4  NEUTROABS 6.7  HGB 11.0*  HCT 34.8*  MCV 88.1  PLT 035   Basic Metabolic Panel:  Recent Labs Lab  04/03/17 1529  NA 139  K 4.0  CL 106  CO2 24  GLUCOSE 161*  BUN 19  CREATININE 0.81  CALCIUM 9.8   GFR: Estimated Creatinine Clearance: 33 mL/min (by C-G formula based on SCr of 0.81 mg/dL). Liver Function Tests:  Recent Labs Lab 04/03/17 1529  AST 24  ALT 10*  ALKPHOS 90  BILITOT 0.5  PROT 6.4*  ALBUMIN 2.2*   No results for input(s): LIPASE, AMYLASE in the last 168 hours. No results for input(s): AMMONIA in the last 168 hours. Coagulation Profile: No results for input(s): INR, PROTIME in the last 168 hours. Cardiac Enzymes:  Recent Labs Lab 04/03/17 1529  TROPONINI 0.05*   BNP (last 3 results) No results for input(s): PROBNP in the last 8760 hours. HbA1C: No results for input(s): HGBA1C in the last 72 hours. CBG:  Recent Labs Lab 04/03/17 1459 04/03/17 1654  GLUCAP 217* 175*   Lipid Profile: No results for input(s): CHOL, HDL, LDLCALC, TRIG, CHOLHDL, LDLDIRECT in the last 72 hours. Thyroid Function Tests: No results for input(s): TSH, T4TOTAL, FREET4, T3FREE, THYROIDAB in the last 72 hours. Anemia Panel: No results for input(s): VITAMINB12, FOLATE, FERRITIN, TIBC, IRON, RETICCTPCT in the last 72 hours. Urine analysis:    Component Value Date/Time   COLORURINE AMBER (A) 04/03/2017 1439   APPEARANCEUR CLEAR 04/03/2017 1439   LABSPEC 1.020 04/03/2017 1439   PHURINE 5.0 04/03/2017 1439   GLUCOSEU 150 (A) 04/03/2017 1439   HGBUR NEGATIVE 04/03/2017 1439   BILIRUBINUR NEGATIVE 04/03/2017 1439   BILIRUBINUR neg 12/03/2013 1135   KETONESUR NEGATIVE 04/03/2017 1439   PROTEINUR 30 (A) 04/03/2017 1439   UROBILINOGEN 0.2 12/03/2013 1135   UROBILINOGEN 1.0 11/14/2013 1325   NITRITE NEGATIVE 04/03/2017 1439   LEUKOCYTESUR NEGATIVE 04/03/2017 1439   Sepsis Labs: @LABRCNTIP (procalcitonin:4,lacticidven:4) ) Recent Results (from the past 240 hour(s))  Culture, blood (Routine X 2) w Reflex to ID Panel     Status: None (Preliminary result)   Collection Time:  04/03/17  4:03 PM  Result Value Ref Range Status   Specimen Description BLOOD RIGHT HAND  Final   Special Requests   Final    BOTTLES DRAWN AEROBIC ONLY Blood Culture results may not be optimal due to an inadequate volume of blood received in  culture bottles   Culture PENDING  Incomplete   Report Status PENDING  Incomplete  Culture, blood (Routine X 2) w Reflex to ID Panel     Status: None (Preliminary result)   Collection Time: 04/03/17  4:24 PM  Result Value Ref Range Status   Specimen Description BLOOD LEFT WRIST  Final   Special Requests   Final    BOTTLES DRAWN AEROBIC ONLY Blood Culture adequate volume   Culture PENDING  Incomplete   Report Status PENDING  Incomplete     Radiological Exams on Admission: Ct Head Wo Contrast  Result Date: 04/03/2017 CLINICAL DATA:  Altered mental status. EXAM: CT HEAD WITHOUT CONTRAST TECHNIQUE: Contiguous axial images were obtained from the base of the skull through the vertex without intravenous contrast. COMPARISON:  02/10/2017 FINDINGS: Brain: There is no evidence for acute hemorrhage, hydrocephalus, mass lesion, or abnormal extra-axial fluid collection. No definite CT evidence for acute infarction. Diffuse loss of parenchymal volume is consistent with atrophy. Patchy low attenuation in the deep hemispheric and periventricular white matter is nonspecific, but likely reflects chronic microvascular ischemic demyelination. 2.2 cm calcified extra-axial right frontal lesion is similar to prior and remains compatible with meningioma. Vascular: Choose 1 Skull: No evidence for fracture. No worrisome lytic or sclerotic lesion. Sinuses/Orbits: The visualized paranasal sinuses and mastoid air cells are clear. Visualized portions of the globes and intraorbital fat are unremarkable. Other: None. IMPRESSION: 1. Stable.  No acute intracranial abnormality. 2. Atrophy with chronic small vessel white matter ischemic disease. 3. Similar appearance of the right frontal  meningioma without adjacent edema. Electronically Signed   By: Misty Stanley M.D.   On: 04/03/2017 18:24   Dg Chest Port 1 View  Result Date: 04/03/2017 CLINICAL DATA:  Hypoxia EXAM: PORTABLE CHEST 1 VIEW COMPARISON:  02/10/2017 FINDINGS: Blunting of the costophrenic sulci and minimal base haziness. No Kerley lines, air bronchogram, or pneumothorax. Chronic cardiomegaly and aortic tortuosity, accentuated by rotation. Skin folds and EKG leads create artifact over the chest. IMPRESSION: Trace pleural fluid and mild atelectasis. Cardiomegaly without edema. Electronically Signed   By: Monte Fantasia M.D.   On: 04/03/2017 15:16    EKG: Independently reviewed. - sinus tachycardia   Assessment/Plan Active Problems:   Hypoglycemia   Encephalopathy    1- Encephalopathy -Recurrent problem and review of most recent admissions.  -Likely multifactorial in the setting of hypoglycemia as well as dehydration. -Unclear true baseline -Ammonia level pending -CT of the head within normal limits -IV fluid hydration  -Follow blood sugars.  2-hypoglycemia -Blood sugar 160s on presentation status post glucagon -? Exogenous etiology versus poor by mouth intake/failure to thrive -D5 half-normal saline hydration -Follow blood sugars  3-? Hypoxia  -Satting well on room air present -Chest x-ray noted with trace pleural effusions and cardiomegaly -Follow respiratory status closely with IV fluid hydration -BNP x 1 -2D ECHO as clinically indicated   4-elevated troponin/CAD -Troponin 0.05 on presentation -Same as most recent admission -Unclear this is patient's baseline -Will trend -Follow  5-HTN -BP stable  -titrate home regimen     DVT prophylaxis: sub q heparin   Code Status: [name]  Family Communication: No family at bedside   Disposition Plan: Pending further evaluation   Consults called: None   Admission status: Inpt    Deneise Lever MD [facility] Pager [phone]  If  7PM-7AM, please contact night-coverage [email or web address] Password [email or web address]  04/03/2017, 7:07 PM

## 2017-04-03 NOTE — ED Notes (Signed)
Per Dr. Ernestina Patches, he states his plan is to watch [name]'s troponin. [name] Hail, RN made aware.

## 2017-04-03 NOTE — ED Provider Notes (Signed)
[facility] DEPT Provider Note   CSN: [id number] Arrival date & time: 04/03/17  1436     History   Chief Complaint Chief Complaint  Patient presents with  . Altered Mental Status    HPI [name] is a 83 y.o. female.  HPI Level V caveat applies. Patient given her normal dose of insulin this morning and then found by staff to be hypoglycemic into the 40s with difficulty arousing. Was given glucagon with improvement of her blood sugar and some improved alertness. EMS arrived and patient was hypoxic. Placed on oxygen. Since her blood sugar was over 100 patient was still very lethargic. Patient has DO NOT RESUSCITATE paperwork at bedside. Patient is normally alert and conversive that she does have dementia. Past Medical History:  Diagnosis Date  . BPPV (benign paroxysmal positional vertigo)   . CAD (coronary artery disease)    a. Inferior STEMI s/p BMSx2 to RCA. Coronary anomaly with single coronary ostium arising from right sinus of Valsalva and likely LM course between PA and aorta.  . Carpal tunnel syndrome 08/04/2009  . Diabetes mellitus ([place])   . Essential hypertension   . [place], [place] 03/26/2010  . INCONTINENCE, STRESS, FEMALE 10/12/2006  . Inferior MI ([place]) 04/21/2011  . 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 H/o Left eye hemianopsia (longstanding).  . Kidney stone   . Multinodular goiter   . Osteoporosis, unspecified 10/12/2006  . RHINITIS, ALLERGIC 10/12/2006  . Single kidney    a. Absence of right kidney by prior imaging.  . TEMPORAL ARTERITIS 10/12/2006  . UNSTEADY GAIT 01/22/2007    Patient Active Problem List   Diagnosis Date Noted  . Palliative care encounter   . Goals of care, counseling/discussion   . Encounter for end of life care   . Encephalopathy 04/03/2017  . Protein-calorie malnutrition, severe 02/14/2017  . Hypokalemia 02/10/2017  . Lethargy 02/10/2017  . Normocytic anemia 02/10/2017  . Hypoglycemia due to insulin 02/10/2017  .  Hypoglycemia 02/10/2017  . Agitated 01/19/2017  . UTI (urinary tract infection) 11/30/2016  . Diabetes mellitus with complication ([place]) 46/27/0350  . Acute lower UTI 11/29/2016  . Acute confusion due to infection   . Acute cystitis without hematuria   . Recurrent falls 10/05/2015  . Acute encephalopathy 09/24/2015  . Fever, myalgia, and generalized weakness 09/24/2015  . Hyperlipidemia 06/02/2015  . PMR (polymyalgia rheumatica) ([facility]) 12/17/2014  . Vertigo 10/12/2014  . Essential hypertension   . Single kidney   . Poorly controlled type 2 diabetes mellitus ([place]) 02/26/2013  . CAD (coronary artery disease) 04/21/2011  . [place], [place] 03/26/2010  . CARPAL TUNNEL SYNDROME 08/04/2009  . UNSTEADY GAIT 01/22/2007  . TEMPORAL ARTERITIS 10/12/2006  . RHINITIS, ALLERGIC 10/12/2006  . OSTEOPOROSIS, UNSPECIFIED 10/12/2006    Past Surgical History:  Procedure Laterality Date  . ABDOMINAL HYSTERECTOMY    . CATARACT EXTRACTION, BILATERAL    . CORONARY ANGIOPLASTY      OB History    No data available       Home Medications    Prior to Admission medications   Medication Sig Start Date End Date Taking? Authorizing Provider  acetaminophen (TYLENOL) 500 MG tablet Take 500 mg by mouth every 4 (four) hours as needed (for headache, doscomfort, or fever of 99.5-101 F).    Yes [provider]  atropine 1 % ophthalmic solution Place 1 drop under the tongue 3 (three) times daily as needed (increased oral secretions). 04/05/17   Isaac Bliss,  Rayford Halsted, MD  polyvinyl alcohol (LIQUIFILM TEARS) 1.4 % ophthalmic solution Place 2 drops into both eyes as needed for dry eyes. 04/05/17   Isaac Bliss, Rayford Halsted, MD    Family History Family History  Problem Relation Age of Onset  . Heart attack Father   . Stroke Mother     Social History Social History  Substance Use Topics  . Smoking status: Never Smoker  . Smokeless tobacco: Never Used  . Alcohol use No     Allergies     Penicillins and Sulfadiazine   Review of Systems Review of Systems  Unable to perform ROS: Mental status change     Physical Exam Updated Vital Signs BP (!) 115/53 (BP Location: Right Arm)   Pulse (!) 106   Temp 98.9 F (37.2 C) (Axillary)   Resp 16   Ht 5\' 5"  (1.651 m)   Wt 39.5 kg (87 lb 1.6 oz)   SpO2 98%   BMI 14.49 kg/m   Physical Exam  Constitutional: She appears well-developed and well-nourished.  Chronically ill-appearing  HENT:  Head: Normocephalic and atraumatic.  Mouth/Throat: Oropharynx is clear and moist.  Head trauma. Mucus membranes are dry.  Eyes: Pupils are equal, round, and reactive to light. EOM are normal.  Pupils are 3+ bilaterally and sluggish.  Neck: Normal range of motion. Neck supple.  No meningismus.  Cardiovascular: Regular rhythm.   Tachycardia.  Pulmonary/Chest: Effort normal.  Diminished breath sounds in bilateral bases.  Abdominal: Soft. Bowel sounds are normal. There is no tenderness. There is no rebound and no guarding.  Musculoskeletal: Normal range of motion. She exhibits no edema or tenderness.  No lower extremity swelling, asymmetry or tenderness. Distal pulses are 2+.  Neurological:  Patient does not respond to verbal and physical stimuli.  Skin: Skin is warm and dry. Capillary refill takes less than 2 seconds. No rash noted. No erythema.  Nursing note and vitals reviewed.    ED Treatments / Results  Labs (all labs ordered are listed, but only abnormal results are displayed) Labs Reviewed  CBC WITH DIFFERENTIAL/PLATELET - Abnormal; Notable for the following:       Result Value   Hemoglobin 11.0 (*)    HCT 34.8 (*)    All other components within normal limits  COMPREHENSIVE METABOLIC PANEL - Abnormal; Notable for the following:    Glucose, Bld 161 (*)    Total Protein 6.4 (*)    Albumin 2.2 (*)    ALT 10 (*)    All other components within normal limits  TROPONIN I - Abnormal; Notable for the following:    Troponin I  0.05 (*)    All other components within normal limits  URINALYSIS, ROUTINE W REFLEX MICROSCOPIC - Abnormal; Notable for the following:    Color, Urine AMBER (*)    Glucose, UA 150 (*)    Protein, ur 30 (*)    Bacteria, UA RARE (*)    Squamous Epithelial / LPF 0-5 (*)    All other components within normal limits  RAPID URINE DRUG SCREEN, HOSP PERFORMED - Abnormal; Notable for the following:    Benzodiazepines POSITIVE (*)    All other components within normal limits  COMPREHENSIVE METABOLIC PANEL - Abnormal; Notable for the following:    CO2 17 (*)    Glucose, Bld 204 (*)    BUN 22 (*)    Albumin 2.2 (*)    ALT 11 (*)    Anion gap 16 (*)  All other components within normal limits  CBC - Abnormal; Notable for the following:    WBC 12.8 (*)    Hemoglobin 10.8 (*)    HCT 33.9 (*)    All other components within normal limits  BRAIN NATRIURETIC PEPTIDE - Abnormal; Notable for the following:    B Natriuretic Peptide 163.0 (*)    All other components within normal limits  TROPONIN I - Abnormal; Notable for the following:    Troponin I 0.10 (*)    All other components within normal limits  TROPONIN I - Abnormal; Notable for the following:    Troponin I 0.09 (*)    All other components within normal limits  TROPONIN I - Abnormal; Notable for the following:    Troponin I 0.08 (*)    All other components within normal limits  LACTIC ACID, PLASMA - Abnormal; Notable for the following:    Lactic Acid, Venous 2.0 (*)    All other components within normal limits  GLUCOSE, CAPILLARY - Abnormal; Notable for the following:    Glucose-Capillary 236 (*)    All other components within normal limits  GLUCOSE, CAPILLARY - Abnormal; Notable for the following:    Glucose-Capillary 276 (*)    All other components within normal limits  GLUCOSE, CAPILLARY - Abnormal; Notable for the following:    Glucose-Capillary 62 (*)    All other components within normal limits  GLUCOSE, CAPILLARY -  Abnormal; Notable for the following:    Glucose-Capillary 267 (*)    All other components within normal limits  I-STAT CG4 LACTIC ACID, ED - Abnormal; Notable for the following:    Lactic Acid, Venous 2.43 (*)    All other components within normal limits  CBG MONITORING, ED - Abnormal; Notable for the following:    Glucose-Capillary 217 (*)    All other components within normal limits  CBG MONITORING, ED - Abnormal; Notable for the following:    Glucose-Capillary 175 (*)    All other components within normal limits  CBG MONITORING, ED - Abnormal; Notable for the following:    Glucose-Capillary 174 (*)    All other components within normal limits  CULTURE, BLOOD (ROUTINE X 2)  CULTURE, BLOOD (ROUTINE X 2)  URINE CULTURE  MRSA PCR SCREENING  ETHANOL    EKG  EKG Interpretation  Date/Time:  Monday April 03 2017 14:38:28 EDT Ventricular Rate:  103 PR Interval:    QRS Duration: 99 QT Interval:  393 QTC Calculation: 515 R Axis:   85 Text Interpretation:  Sinus tachycardia Borderline right axis deviation Low voltage, precordial leads y Artifact in lead(s) I II III aVR aVL aVF V1 V3 V4 V5 V6 and baseline wander in lead(s) V4 Confirmed by Davonna Belling [id number]) on 04/04/2017 8:46:34 PM       Radiology No results found.  Procedures Procedures (including critical care time)  Medications Ordered in ED Medications  dextrose 50 % solution 50 mL ( Intravenous Given 04/03/17 1447)  sodium chloride 0.9 % bolus 500 mL (0 mLs Intravenous Stopped 04/03/17 1831)  flumazenil (ROMAZICON) injection 0.2 mg (0.2 mg Intravenous Given 04/04/17 0954)     Initial Impression / Assessment and Plan / ED Course  I have reviewed the triage vital signs and the nursing notes.  Pertinent labs & imaging results that were available during my care of the patient were reviewed by me and considered in my medical decision making (see chart for details).     Review of patient's med list  shows that she  was recently started on Ativan for anxiety. Given dose of Ativan at 9:00 this morning. Discussed with hospitalist who will see patient in the emergency department and admit. Final Clinical Impressions(s) / ED Diagnoses   Final diagnoses:  Somnolence  Hypoglycemia    New Prescriptions Discharge Medication List as of 04/05/2017 12:54 PM    START taking these medications   Details  atropine 1 % ophthalmic solution Place 1 drop under the tongue 3 (three) times daily as needed (increased oral secretions)., Starting Wed 04/05/2017, Normal    polyvinyl alcohol (LIQUIFILM TEARS) 1.4 % ophthalmic solution Place 2 drops into both eyes as needed for dry eyes., Starting Wed 04/05/2017, Normal         Julianne Rice, MD 04/07/17 1308

## 2017-04-03 NOTE — ED Triage Notes (Signed)
Pt brought in by EMS due to altered mental status. Reported that pts CBG was 336 this morning and was given sliding scale at 0630 she was rechecked at 0830 and cbg 42. Glugagon was given at that time. At 1 pm 109, EMS arrived at 2pm and results 118. Pt has not eaten today per EMS. Pt 89 % when EMS arrived currently on NRB. Pt unresponsive to stimuli

## 2017-04-03 NOTE — ED Notes (Signed)
Son [name] called and was updated on condition

## 2017-04-03 NOTE — ED Notes (Signed)
CBG  62. EDP notified

## 2017-04-03 NOTE — ED Notes (Signed)
Call Jarvis Newcomer  [phone] with update

## 2017-04-04 ENCOUNTER — Encounter (HOSPITAL_COMMUNITY): Payer: Self-pay | Admitting: Primary Care

## 2017-04-04 ENCOUNTER — Observation Stay (HOSPITAL_COMMUNITY): Payer: Medicare Other

## 2017-04-04 DIAGNOSIS — Z7189 Other specified counseling: Secondary | ICD-10-CM

## 2017-04-04 DIAGNOSIS — R400 Somnolence: Secondary | ICD-10-CM | POA: Diagnosis not present

## 2017-04-04 DIAGNOSIS — Z515 Encounter for palliative care: Secondary | ICD-10-CM

## 2017-04-04 DIAGNOSIS — E162 Hypoglycemia, unspecified: Secondary | ICD-10-CM | POA: Diagnosis not present

## 2017-04-04 DIAGNOSIS — G9340 Encephalopathy, unspecified: Secondary | ICD-10-CM

## 2017-04-04 LAB — COMPREHENSIVE METABOLIC PANEL
ALK PHOS: 94 U/L (ref 38–126)
ALT: 11 U/L — AB (ref 14–54)
AST: 31 U/L (ref 15–41)
Albumin: 2.2 g/dL — ABNORMAL LOW (ref 3.5–5.0)
Anion gap: 16 — ABNORMAL HIGH (ref 5–15)
BILIRUBIN TOTAL: 0.9 mg/dL (ref 0.3–1.2)
BUN: 22 mg/dL — ABNORMAL HIGH (ref 6–20)
CALCIUM: 9.7 mg/dL (ref 8.9–10.3)
CO2: 17 mmol/L — AB (ref 22–32)
CREATININE: 0.82 mg/dL (ref 0.44–1.00)
Chloride: 111 mmol/L (ref 101–111)
Glucose, Bld: 204 mg/dL — ABNORMAL HIGH (ref 65–99)
Potassium: 4.8 mmol/L (ref 3.5–5.1)
SODIUM: 144 mmol/L (ref 135–145)
TOTAL PROTEIN: 6.5 g/dL (ref 6.5–8.1)

## 2017-04-04 LAB — CBC
HCT: 33.9 % — ABNORMAL LOW (ref 36.0–46.0)
Hemoglobin: 10.8 g/dL — ABNORMAL LOW (ref 12.0–15.0)
MCH: 27.8 pg (ref 26.0–34.0)
MCHC: 31.9 g/dL (ref 30.0–36.0)
MCV: 87.4 fL (ref 78.0–100.0)
PLATELETS: 271 10*3/uL (ref 150–400)
RBC: 3.88 MIL/uL (ref 3.87–5.11)
RDW: 15.1 % (ref 11.5–15.5)
WBC: 12.8 10*3/uL — ABNORMAL HIGH (ref 4.0–10.5)

## 2017-04-04 LAB — TROPONIN I
Troponin I: 0.09 ng/mL (ref <0.03)
Troponin I: 0.08 ng/mL (ref <0.03)

## 2017-04-04 LAB — GLUCOSE, CAPILLARY
GLUCOSE-CAPILLARY: 62 mg/dL — AB (ref 65–99)
GLUCOSE-CAPILLARY: 236 mg/dL — AB (ref 65–99)
Glucose-Capillary: 276 mg/dL — ABNORMAL HIGH (ref 65–99)
Glucose-Capillary: 267 mg/dL — ABNORMAL HIGH (ref 65–99)

## 2017-04-04 LAB — LACTIC ACID, PLASMA: Lactic Acid, Venous: 2 mmol/L (ref 0.5–1.9)

## 2017-04-04 LAB — MRSA PCR SCREENING: MRSA by PCR: NEGATIVE

## 2017-04-04 MED ORDER — FLUMAZENIL 0.5 MG/5ML IV SOLN
0.2000 mg | Freq: Once | INTRAVENOUS | Status: AC
  Administered 2017-04-04: 0.2 mg via INTRAVENOUS
  Filled 2017-04-04: qty 5

## 2017-04-04 NOTE — Progress Notes (Signed)
Dr. Marin Comment paged and made aware that pt has no order for blood sugars to be checked and was admitted for encephalopathy possible d/t hypoglycemia. Also adv Dr. Marin Comment of critical troponin level 0.09 and lactic acid-2.0. New order for q4 blood sugars

## 2017-04-04 NOTE — Plan of Care (Signed)
[name] requests transfer to [place] for comfort and dignity at end of life.  He states he will go to his job tomorrow, but, his phone will be on and he will answer it.

## 2017-04-04 NOTE — Consult Note (Signed)
Consultation Note Date: 04/04/2017   Patient Name: [name]  DOB: Jul 16, 1923  MRN: [id number]  Age / Sex: 83 y.o., female  PCP: Eulas Post, MD Referring Physician: Waldron Session, MD  Reason for Consultation: Establishing goals of care, Psychosocial/spiritual support and Terminal Care  HPI/Patient Profile: 83 y.o. female  with past medical history of hypertension, coronary artery disease, diabetes, resident of [facility] admitted on 4/58/5929 with metabolic encephalopathy with prolonged hypoglycemia and subsequent coma, likely anoxic brain injury.   Clinical Assessment and Goals of Care: Mrs. Sunga is resting quietly in bed. She does not respond in any way. Her pupils are at a 4 and fixed. There is no family of bedside at this time. There are no signs and symptoms of discomfort for Mrs. Spickler at this time.  Call to daughter, Sharene Butters. [name] states she has returned home. [name] shares that her brother, Reannah Totten is primary healthcare power of attorney. I share with [name] my concerns over the severity of Mrs. Jansson illness, and the likelihood that she will have no meaningful recovery.  Call to son Anaisabel Pederson, left multiple voicemails, tried multiple phone calls without leaving voicemail. Call to [place] girlfriend, Delilah Shan, no message left.  Mrs. Berryhill has advanced directives document in epic naming her son Madina Galati as primary decision maker, but daughter [name] is secondary if Fara Olden is unavailable. Call to [name], we talk about unburdening Mrs. Fischbach from painful heparin injections that are changing what is happening. We also talk about unburdening Mrs. Shimamoto from blood sugar finger sticks, any painful interventions. I share that focusing on comfort and dignity at this point would be a kindness for Mrs. Turpen. [name] agrees to unburdening Mrs. Patty from heparin injections and blood  sugar checks. Comfort measures only.  Later in the afternoon received phone call from [facility]. I share with her the severity of Mrs. Douglass illness, and that I believe time to be short for her. Lattie Haw states that [employer] power of attorney. I share that the documents we have so that [name] is the secondary decision maker when Fara Olden is unavailable. Lattie Haw states that Fara Olden was at his work and unable to answer the phone. I encourage Lattie Haw to set up a family meeting for tomorrow. Encouraging Fara Olden to take the day off of work.  Healthcare power of attorney HCPOA - advanced directives located in document section of epic. Mrs. Fineberg named her son, Cailee Blanke as 1st decision maker. Daughter Sharene Butters is listed as 2nd decision maker if Fara Olden is unavailable   SUMMARY OF RECOMMENDATIONS   unburden Mrs. Haft from medications and treatments that are painful, and not changing what is happening. Full comfort care.  Code Status/Advance Care Planning:  [name]  Symptom Management:   per hospitalist, no additional needs at this time.  Palliative Prophylaxis:   Aspiration and Turn Reposition  Additional Recommendations (Limitations, Scope, Preferences):  Full Comfort Care  Psycho-social/Spiritual:   Desire for further Chaplaincy support:no  Additional Recommendations: Caregiving  Support/Resources  and Education on Hospice  Prognosis:   Hours - Days  Discharge Planning: Anticipated Hospital death versus transfer to hospice home of [facility]      Primary Diagnoses: Present on Admission: . Encephalopathy . Hypoglycemia   I have reviewed the medical record, interviewed the patient and family, and examined the patient. The following aspects are pertinent.  Past Medical History:  Diagnosis Date  . BPPV (benign paroxysmal positional vertigo)   . CAD (coronary artery disease)    a. Inferior STEMI s/p BMSx2 to RCA. Coronary anomaly with single coronary ostium arising from  right sinus of Valsalva and likely LM course between PA and aorta.  . Carpal tunnel syndrome 08/04/2009  . Diabetes mellitus ([place])   . Essential hypertension   . [place], [place] 03/26/2010  . INCONTINENCE, STRESS, FEMALE 10/12/2006  . Inferior MI ([place]) 04/21/2011  . 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 H/o Left eye hemianopsia (longstanding).  . Kidney stone   . Multinodular goiter   . Osteoporosis, unspecified 10/12/2006  . RHINITIS, ALLERGIC 10/12/2006  . Single kidney    a. Absence of right kidney by prior imaging.  . TEMPORAL ARTERITIS 10/12/2006  . UNSTEADY GAIT 01/22/2007   Social History   Social History  . Marital status: Widowed    Spouse name: N/A  . Number of children: N/A  . Years of education: N/A   Social History Main Topics  . Smoking status: Never Smoker  . Smokeless tobacco: Never Used  . Alcohol use No  . Drug use: No  . Sexual activity: No   Other Topics Concern  . None   Social History Narrative  . None   Family History  Problem Relation Age of Onset  . Heart attack Father   . Stroke Mother    Scheduled Meds: . heparin  5,000 Units Subcutaneous Q8H   Continuous Infusions: PRN Meds:. Medications Prior to Admission:  Prior to Admission medications   Medication Sig Start Date End Date Taking? Authorizing Provider  acetaminophen (TYLENOL) 500 MG tablet Take 500 mg by mouth every 4 (four) hours as needed (for headache, doscomfort, or fever of 99.5-101 F).    Yes [provider]  Amino Acids-Protein Hydrolys (FEEDING SUPPLEMENT, PRO-STAT SUGAR FREE 64,) LIQD Take 30 mLs by mouth daily. 02/14/17  Yes Mikhail, Velta Addison, DO  azelastine (ASTELIN) 0.1 % nasal spray USE 2 SPRAYS IN EACH NOSTRILTWICE DAILY 10/03/16  Yes Burchette, Alinda Sierras, MD  bisacodyl (DULCOLAX) 10 MG suppository Place 10 mg rectally as needed for moderate constipation.   Yes [provider]  busPIRone (BUSPAR) 10 MG tablet Take 10 mg by mouth 2 (two) times daily.   Yes  [provider]  Cholecalciferol (VITAMIN D) 2000 UNITS CAPS Take 2,000 Units by mouth daily.   Yes [provider]  ferrous sulfate 325 (65 FE) MG tablet Take 325 mg by mouth 2 (two) times daily with a meal.   Yes [provider]  HYDROcodone-acetaminophen (NORCO/VICODIN) 5-325 MG tablet Take 1 tablet by mouth daily as needed (for pain). 02/14/17  Yes Mikhail, Maryann, DO  insulin aspart (NOVOLOG) 100 UNIT/ML injection Inject 0-5 Units into the skin 4 (four) times daily -  with meals and at bedtime. Sliding scale  CBG 70 - 120: 0 units  CBG 121 - 150: 1 unit,   CBG 151 - 200: 2 units,   CBG 201 - 250: 3 units,   CBG 251 - 300: 5 units,  CBG 301 - 350: 7 units,   CBG 351 - 400: 9 units    CBG > 400: 9 units and notify your MD 02/14/17  Yes Mikhail, Velta Addison, DO  LORazepam (ATIVAN) 0.5 MG tablet Take 0.5 tablets (0.25 mg total) by mouth 2 (two) times daily. 02/14/17  Yes Mikhail, Maryann, DO  losartan (COZAAR) 50 MG tablet TAKE 1 TABLET BY MOUTH EVERY DAY 11/07/16  Yes Burchette, Alinda Sierras, MD  metoprolol succinate (TOPROL-XL) 25 MG 24 hr tablet TAKE 1 TABLET (25 MG TOTAL) BY MOUTH DAILY. 07/04/16  Yes Burchette, Alinda Sierras, MD  Multiple Vitamin (MULTIVITAMIN WITH MINERALS) TABS tablet Take 1 tablet by mouth daily. 02/14/17  Yes Mikhail, Velta Addison, DO  nitroGLYCERIN (NITROSTAT) 0.4 MG SL tablet Place 0.4 mg under the tongue every 5 (five) minutes as needed for chest pain.    Yes [provider]  senna-docusate (SENOKOT-S) 8.6-50 MG tablet Take 1 tablet by mouth 2 (two) times daily.   Yes [provider]  TRAVATAN Z 0.004 % SOLN ophthalmic solution Place 1 drop into both eyes at bedtime.    Yes [provider]  vitamin C (ASCORBIC ACID) 500 MG tablet Take 500 mg by mouth 2 (two) times daily.   Yes [provider]  atorvastatin (LIPITOR) 20 MG tablet TAKE 1 TABLET (20 MG TOTAL) BY MOUTH DAILY. Patient not taking: Reported on 04/03/2017 11/18/16    Eulas Post, MD  blood glucose meter kit and supplies Dispense based on patient and insurance preference. Use up to four times daily as directed. (FOR ICD-9 250.00, 250.01). Please check sugars before meals and at night. Keep a log. 01/21/17   Arrien, Jimmy Picket, MD  feeding supplement, ENSURE ENLIVE, (ENSURE ENLIVE) LIQD Take 237 mLs by mouth 2 (two) times daily between meals. Patient not taking: Reported on 04/03/2017 02/14/17   Cristal Ford, DO  Insulin Pen Needle (NOVOFINE) 30G X 8 MM MISC Inject 10 each into the skin as needed. 01/21/17   Arrien, Jimmy Picket, MD   Allergies  Allergen Reactions  . Penicillins Hives and Other (See Comments)    Has patient had a PCN reaction causing immediate rash, facial/tongue/throat swelling, SOB or lightheadedness with hypotension: Yes Has patient had a PCN reaction causing severe rash involving mucus membranes or skin necrosis: Unk Has patient had a PCN reaction that required hospitalization: Unk Has patient had a PCN reaction occurring within the last 10 years: Unk If all of the above answers are "NO", then may proceed with Cephalosporin use.  . Sulfadiazine Hives   Review of Systems  Unable to perform ROS: Mental status change    Physical Exam  Constitutional: No distress.  Does not respond to voice or touch  HENT:  Head: Atraumatic.  Temporal wasting  Cardiovascular: Normal rate.   Pulmonary/Chest: Effort normal. No respiratory distress.  Abdominal: Soft. She exhibits no distension.  Musculoskeletal: She exhibits no edema.  Muscle wasting  Neurological:  Nonresponsive, pupils 4 fixed  Skin: Skin is warm and dry.  Nursing note and vitals reviewed.   Vital Signs: BP (!) 131/58 (BP Location: Left Arm)   Pulse 90   Temp 98 F (36.7 C) (Axillary)   Resp 18   Ht _0  (1.651 m)   Wt 39.5 kg (87 lb 1.6 oz)   SpO2 94%   BMI 14.49 kg/m  Pain Assessment: PAINAD       SpO2: SpO2: 94 % O2 Device:SpO2: 94 % O2 Flow  Rate: .O2 Flow Rate (  L/min): 1 L/min  IO: Intake/output summary:  Intake/Output Summary (Last 24 hours) at 04/04/17 1120 Last data filed at 04/04/17 1100  Gross per 24 hour  Intake           211.25 ml  Output              350 ml  Net          -138.75 ml    LBM: Last BM Date: 04/04/17 Baseline Weight: Weight: 47.2 kg (104 lb) Most recent weight: Weight: 39.5 kg (87 lb 1.6 oz)     Palliative Assessment/Data:   Flowsheet Rows     Most Recent Value  Intake Tab  Referral Department  Hospitalist  Unit at Time of Referral  Med/Surg Unit  Palliative Care Primary Diagnosis  Other (Comment) [hypoglycemia]  Date Notified  04/04/17  Reason for referral  Clarify Goals of Care  Date of Admission  04/03/17  Date first seen by Palliative Care  04/04/17  # of days Palliative referral response time  0 Day(s)  # of days IP prior to Palliative referral  1  Clinical Assessment  Palliative Performance Scale Score  10%  Pain Max last 24 hours  Not able to report  Pain Min Last 24 hours  Not able to report  Dyspnea Max Last 24 Hours  Not able to report  Dyspnea Min Last 24 hours  Not able to report  Psychosocial & Spiritual Assessment  Palliative Care Outcomes  Patient/Family meeting held?  Yes  Who was at the meeting?  [name] daughter via phone.   Palliative Care Outcomes  Clarified goals of care, Counseled regarding hospice, Provided psychosocial or spiritual support  Patient/Family wishes: Interventions discontinued/not started   Mechanical Ventilation      Time In: 1305 Time Out: 1355 Time Total: 50 minutes Greater than 50%  of this time was spent counseling and coordinating care related to the above assessment and plan.  Signed by: Drue Novel, NP   Please contact Palliative Medicine Team phone at [phone] for questions and concerns.  For individual provider: See Shea Evans

## 2017-04-04 NOTE — Progress Notes (Signed)
Inpatient Diabetes Program Recommendations  AACE/ADA: New Consensus Statement on Inpatient Glycemic Control (2015)  Target Ranges:  Prepandial:   less than 140 mg/dL      Peak postprandial:   less than 180 mg/dL (1-2 hours)      Critically ill patients:  140 - 180 mg/dL   Results for [name], [name] (MRN [id number]) as of 04/04/2017 09:22  Ref. Range 04/03/2017 14:59 04/03/2017 16:54 04/03/2017 20:27 04/04/2017 03:06 04/04/2017 07:50  Glucose-Capillary Latest Ref Range: 65 - 99 mg/dL 217 (H) 175 (H) 174 (H) 236 (H) 276 (H)   Review of Glycemic Control  Diabetes history: DM2 Outpatient Diabetes medications: Novolog 0-5 units QID Current orders for Inpatient glycemic control: None  Inpatient Diabetes Program Recommendations: Correction (SSI): If appropriate, please consider ordering Novolog 0-9 units Q4H.  Thanks, Barnie Alderman, RN, MSN, CDE Diabetes Coordinator Inpatient Diabetes Program [phone] (Team Pager from 8am to 5pm)

## 2017-04-04 NOTE — Care Management Obs Status (Signed)
[facility] NOTIFICATION   Patient Details  Name: [name] MRN: [id number] Date of Birth: 1923-01-09   Medicare Observation Status Notification Given:  Yes    Amman Bartel, Chauncey Reading, RN 04/04/2017, 1:59 PM

## 2017-04-04 NOTE — Plan of Care (Signed)
Problem: Safety: Goal: Ability to remain free from injury will improve Outcome: Not Progressing Pt unresponsive d/t possible hypoglycemia or dehydration. Pt placed on high fall risk, fall mat on floor d/t bone-fracture risk. Pt educated on fall/safety risk but education needs to be reinforced. Bed in lowest locked position, SR up x3, call bell within reach. Will continue to monitor pt

## 2017-04-04 NOTE — Progress Notes (Signed)
RN called and spoke with Elta Guadeloupe at [facility] facility where pt is from. RN asked Elta Guadeloupe the pts normal functioning status. Elta Guadeloupe stated that pt is very hard of hearing and the he usually writes things down and pt will do them.  Elta Guadeloupe stated he had pt Saturday night and pt was following commands fine. Lori in ED told RN that pts family member stated she hadn't eaten since Friday and pt is normally unresponsive even with hearing aids.  Pt was unresponsive to sternal rub as well as lab draw. Pts eyes are open but she is not following. VSS, blood sugar 236 when rechecked. Will continue to monitor pt

## 2017-04-04 NOTE — H&P (Signed)
PROGRESS NOTE    [name]  MAU:[id number] DOB: 1923/05/28 DOA: 04/03/2017 PCP: Eulas Post, MD     Brief Narrative:   83 yo female who is minimally responsive at the baseline presents with coma after unknown length episode of hypoglycemia .  Assessment & Plan:   1-Metabolic encephalopathy with prolonged hypoglycemia and subsequent coma. 2- trop leak in the setting of hypoglycemia . 3-Hx of HTN. 4-Hx of CAD. 5-Hx of DM.   Plan :  Mostly sequela of profound hypoglycemia with anoxic brain injury , no recovery so far , will ask our palliative team to proceed with hospice eval , avoid any sedative , continue D5.  DVT prophylaxis: (Lovenox Code Status: ([name] Family Communication: (NONE    Disposition Plan: (MOSTLY HOSPICE  Consultants:   NONE     Procedures:NONE   Antimicrobials: (specify start and planned stop date. Auto populated tables are space occupying and do not give end dates) NONE    Subjective: DEEP COMA , not responsive.  Objective: Vitals:   04/03/17 2100 04/03/17 2130 04/03/17 2330 04/04/17 0434  BP: (!) 145/85 136/67 128/72 (!) 131/58  Pulse: 94 92 90 90  Resp: (!) 21 (!) 21 20 18   Temp:   97.6 F (36.4 C) 98 F (36.7 C)  TempSrc:   Axillary Axillary  SpO2: 96% 97% 98% 94%  Weight:   39.5 kg (87 lb 1.6 oz)   Height:   5\' 5"  (1.651 m)     Intake/Output Summary (Last 24 hours) at 04/04/17 0917 Last data filed at 04/04/17 0321  Gross per 24 hour  Intake           211.25 ml  Output                0 ml  Net           211.25 ml   Filed Weights   04/03/17 1439 04/03/17 2330  Weight: 47.2 kg (104 lb) 39.5 kg (87 lb 1.6 oz)    Examination:  General exam: non responsive . Respiratory system: Clear to auscultation. Respiratory effort normal. Cardiovascular system: S1 & S2 heard, RRR. No JVD, murmurs, rubs, gallops or clicks. No pedal edema. Gastrointestinal system: Abdomen is nondistended, soft and nontender. No organomegaly or  masses felt. Normal bowel sounds heard. Central nervous system: Deep coma . Skin: No rashes, lesions or ulcers     Data Reviewed: I have personally reviewed following labs and imaging studies  CBC:  Recent Labs Lab 04/03/17 1529 04/04/17 0456  WBC 8.4 12.8*  NEUTROABS 6.7  --   HGB 11.0* 10.8*  HCT 34.8* 33.9*  MCV 88.1 87.4  PLT 261 563   Basic Metabolic Panel:  Recent Labs Lab 04/03/17 1529 04/04/17 0456  NA 139 144  K 4.0 4.8  CL 106 111  CO2 24 17*  GLUCOSE 161* 204*  BUN 19 22*  CREATININE 0.81 0.82  CALCIUM 9.8 9.7   GFR: Estimated Creatinine Clearance: 27.3 mL/min (by C-G formula based on SCr of 0.82 mg/dL). Liver Function Tests:  Recent Labs Lab 04/03/17 1529 04/04/17 0456  AST 24 31  ALT 10* 11*  ALKPHOS 90 94  BILITOT 0.5 0.9  PROT 6.4* 6.5  ALBUMIN 2.2* 2.2*   No results for input(s): LIPASE, AMYLASE in the last 168 hours. No results for input(s): AMMONIA in the last 168 hours. Coagulation Profile: No results for input(s): INR, PROTIME in the last 168 hours. Cardiac Enzymes:  Recent Labs Lab 04/03/17  1529 04/03/17 2056 04/04/17 0150  TROPONINI 0.05* 0.10* 0.09*   BNP (last 3 results) No results for input(s): PROBNP in the last 8760 hours. HbA1C: No results for input(s): HGBA1C in the last 72 hours. CBG:  Recent Labs Lab 04/03/17 1459 04/03/17 1654 04/03/17 2027 04/04/17 0306 04/04/17 0750  GLUCAP 217* 175* 174* 236* 276*   Lipid Profile: No results for input(s): CHOL, HDL, LDLCALC, TRIG, CHOLHDL, LDLDIRECT in the last 72 hours. Thyroid Function Tests: No results for input(s): TSH, T4TOTAL, FREET4, T3FREE, THYROIDAB in the last 72 hours. Anemia Panel: No results for input(s): VITAMINB12, FOLATE, FERRITIN, TIBC, IRON, RETICCTPCT in the last 72 hours. Urine analysis:    Component Value Date/Time   COLORURINE AMBER (A) 04/03/2017 1439   APPEARANCEUR CLEAR 04/03/2017 1439   LABSPEC 1.020 04/03/2017 1439   PHURINE 5.0  04/03/2017 1439   GLUCOSEU 150 (A) 04/03/2017 1439   HGBUR NEGATIVE 04/03/2017 1439   BILIRUBINUR NEGATIVE 04/03/2017 1439   BILIRUBINUR neg 12/03/2013 1135   KETONESUR NEGATIVE 04/03/2017 1439   PROTEINUR 30 (A) 04/03/2017 1439   UROBILINOGEN 0.2 12/03/2013 1135   UROBILINOGEN 1.0 11/14/2013 1325   NITRITE NEGATIVE 04/03/2017 1439   LEUKOCYTESUR NEGATIVE 04/03/2017 1439   Sepsis Labs: @LABRCNTIP (procalcitonin:4,lacticidven:4)  ) Recent Results (from the past 240 hour(s))  Culture, blood (Routine X 2) w Reflex to ID Panel     Status: None (Preliminary result)   Collection Time: 04/03/17  4:03 PM  Result Value Ref Range Status   Specimen Description BLOOD RIGHT HAND  Final   Special Requests   Final    BOTTLES DRAWN AEROBIC ONLY Blood Culture results may not be optimal due to an inadequate volume of blood received in culture bottles   Culture NO GROWTH < 24 HOURS  Final   Report Status PENDING  Incomplete  Culture, blood (Routine X 2) w Reflex to ID Panel     Status: None (Preliminary result)   Collection Time: 04/03/17  4:24 PM  Result Value Ref Range Status   Specimen Description BLOOD LEFT WRIST  Final   Special Requests   Final    BOTTLES DRAWN AEROBIC ONLY Blood Culture adequate volume   Culture NO GROWTH < 24 HOURS  Final   Report Status PENDING  Incomplete  MRSA PCR Screening     Status: None   Collection Time: 04/04/17  1:15 AM  Result Value Ref Range Status   MRSA by PCR NEGATIVE NEGATIVE Final    Comment:        The GeneXpert MRSA Assay (FDA approved for NASAL specimens only), is one component of a comprehensive MRSA colonization surveillance program. It is not intended to diagnose MRSA infection nor to guide or monitor treatment for MRSA infections.          Radiology Studies: Ct Head Wo Contrast  Result Date: 04/03/2017 CLINICAL DATA:  Altered mental status. EXAM: CT HEAD WITHOUT CONTRAST TECHNIQUE: Contiguous axial images were obtained from the  base of the skull through the vertex without intravenous contrast. COMPARISON:  02/10/2017 FINDINGS: Brain: There is no evidence for acute hemorrhage, hydrocephalus, mass lesion, or abnormal extra-axial fluid collection. No definite CT evidence for acute infarction. Diffuse loss of parenchymal volume is consistent with atrophy. Patchy low attenuation in the deep hemispheric and periventricular white matter is nonspecific, but likely reflects chronic microvascular ischemic demyelination. 2.2 cm calcified extra-axial right frontal lesion is similar to prior and remains compatible with meningioma. Vascular: Choose 1 Skull: No evidence for fracture. No  worrisome lytic or sclerotic lesion. Sinuses/Orbits: The visualized paranasal sinuses and mastoid air cells are clear. Visualized portions of the globes and intraorbital fat are unremarkable. Other: None. IMPRESSION: 1. Stable.  No acute intracranial abnormality. 2. Atrophy with chronic small vessel white matter ischemic disease. 3. Similar appearance of the right frontal meningioma without adjacent edema. Electronically Signed   By: Misty Stanley M.D.   On: 04/03/2017 18:24   Dg Chest Port 1 View  Result Date: 04/03/2017 CLINICAL DATA:  Hypoxia EXAM: PORTABLE CHEST 1 VIEW COMPARISON:  02/10/2017 FINDINGS: Blunting of the costophrenic sulci and minimal base haziness. No Kerley lines, air bronchogram, or pneumothorax. Chronic cardiomegaly and aortic tortuosity, accentuated by rotation. Skin folds and EKG leads create artifact over the chest. IMPRESSION: Trace pleural fluid and mild atelectasis. Cardiomegaly without edema. Electronically Signed   By: Monte Fantasia M.D.   On: 04/03/2017 15:16        Scheduled Meds: . flumazenil  0.2 mg Intravenous Once  . heparin  5,000 Units Subcutaneous Q8H   Continuous Infusions:   LOS: 0 days    Time spent: 35 minutes     Waldron Session, MD [facility]   If 7PM-7AM, please contact  night-coverage [email or web address] Password [facility] 04/04/2017, 9:17 AM

## 2017-04-04 NOTE — Progress Notes (Signed)
Dr. Burnis Medin on unit and gave order for Palliative consult for patient - recommended hospice care.

## 2017-04-05 DIAGNOSIS — E162 Hypoglycemia, unspecified: Secondary | ICD-10-CM | POA: Diagnosis not present

## 2017-04-05 DIAGNOSIS — E43 Unspecified severe protein-calorie malnutrition: Secondary | ICD-10-CM | POA: Diagnosis not present

## 2017-04-05 DIAGNOSIS — G9340 Encephalopathy, unspecified: Secondary | ICD-10-CM | POA: Diagnosis not present

## 2017-04-05 DIAGNOSIS — R279 Unspecified lack of coordination: Secondary | ICD-10-CM | POA: Diagnosis not present

## 2017-04-05 DIAGNOSIS — Z7401 Bed confinement status: Secondary | ICD-10-CM | POA: Diagnosis not present

## 2017-04-05 DIAGNOSIS — Z7189 Other specified counseling: Secondary | ICD-10-CM | POA: Diagnosis not present

## 2017-04-05 DIAGNOSIS — Z515 Encounter for palliative care: Secondary | ICD-10-CM | POA: Diagnosis not present

## 2017-04-05 LAB — URINE CULTURE: CULTURE: NO GROWTH

## 2017-04-05 MED ORDER — POLYVINYL ALCOHOL 1.4 % OP SOLN: 2.0000 [drp] | OPHTHALMIC | Status: DC | PRN

## 2017-04-05 MED ORDER — ATROPINE SULFATE 1 % OP SOLN: 1.0000 [drp] | Freq: Three times a day (TID) | OPHTHALMIC | 12 refills | Status: AC | PRN

## 2017-04-05 MED ORDER — ATROPINE SULFATE 1 % OP SOLN
1.0000 [drp] | Freq: Three times a day (TID) | OPHTHALMIC | Status: DC | PRN
  Filled 2017-04-05: qty 2

## 2017-04-05 MED ORDER — POLYVINYL ALCOHOL 1.4 % OP SOLN: 2.0000 [drp] | OPHTHALMIC | 0 refills | Status: AC | PRN

## 2017-04-05 NOTE — Care Management (Signed)
CM reviewed note from Palliative NP that family would like hospice home of [place]. CM will fax documents for referral (unsure if this has already been done). CM will follow up with CSW.    Talia Hoheisel RN, BSN, [facility] Case Management [phone]

## 2017-04-05 NOTE — Progress Notes (Signed)
Patient is to be discharged to [facility] [place]. Report called to Maudie Mercury, RN at [facility] and family aware of transport.  Celestia Khat, RN

## 2017-04-05 NOTE — Care Management (Signed)
CM has talked to [place] and Fara Olden (pt. Son). Transport will be arranged for lunch time if possible. MD aware.

## 2017-04-05 NOTE — Care Management (Signed)
Patient is ready for DC to Hospice Home. CM updated Fara Olden, pt's son. He is aware of transport being arranged and is in route to fill out paperwork at hospice. [facility] is ready for patient's transfer. EMS called. Bedside RN has called report to [place].

## 2017-04-05 NOTE — Plan of Care (Signed)
Problem: Skin Integrity: Goal: Risk for impaired skin integrity will decrease Outcome: Adequate for Discharge Pt unresponsive, has stage 1 on sacrum. Pt turned q2h to prevent further skin breakdown. Pt educated on prevention of skin breakdown, d/t unresponsiveness educated in reiterated upon each turn. Will continue to monitor pt

## 2017-04-05 NOTE — Care Management (Addendum)
PCP - [name] SS# [id number]  Patient Information   Patient Name [name], [name] ([id number]) Sex Female DOB 1923/04/27  Room Bed  A303 A303-01  Patient Demographics   Address [address] [place] [address] Phone [phone] [facility]) [phone] (Mobile)  Patient Ethnicity & Race   Ethnic Group Patient Race  Not Hispanic or Latino White or Caucasian  Emergency Contact(s)   Name Relation Home Work Mobile  [name] Son   [phone]  [name]   [phone]  [facility] Daughter [phone]  [phone]  [name] Niece [phone]  [phone]  Documents on File    Status Date Received Description  Documents for the Patient  EMR Medication Summary Not Received    EMR Problem Summary Not Received    EMR Immunization Summary Not Received    EMR Patient Summary Not Received    [place] E-Signature HIPAA Notice of Privacy Received 10/18/10   Insurance Card Received 05/31/13 Medicare/[employer]  AMB Correspondence Not Received  08/12 office note [facility]  AMB Correspondence Not Received  12/12 card [place] Card Received 06/11/14 Medicare/[employer] 2012  Release of Information Not Received    Release of Information Not Received    [place]end Not Received    Advance Directives/Living Will/HCPOA/POA Not Received    Driver's License Not Received    AMB Correspondence  11/14/13 03/15 Summary [facility]   AMB Correspondence  01/21/14 05/15 OFFICE NOTE [facility]  HIM ROI Authorization (Expired) 07/04/14 [facility] records from [place] request:djc  AMB Correspondence  07/21/14 08/12-09/14 OFFICE [facility]ES [facility]  Release of Information  07/21/14   AMB Correspondence  07/21/14 DISCHARGE NOTE [facility]  Insurance Card Received 05/01/15 [place] AND [employer]  AMB Correspondence  10/28/14 CLINICAL NOTES [facility]  AMB Correspondence  04/23/14 EXAMINATION [place] [facility]  THERAP[facility]  Other Photo ID Not Received    Advance Directives/Living Will/HCPOA/POA Received 12/17/14 Durable POA  Insurance Card Received 02/27/15 VERIFIED BY [employer]  Insurance Card Received 12/17/14 [employer] ins. verification by phone  Insurance Card Received 06/02/15 MEDICARE & [employer]ENTRY 2016  AMB HH/NH/Hospice  09/25/15 PROFESSIONAL COMMUNICATION [facility]  AMB Provider Completed Forms  10/06/15 PT PATIENT HOME HEALTH SERVICES REFERRED [facility]  AMB HH/NH/Hospice  02/77/41 CERTIFICATION/POC [facility]  AMB Provider Completed Forms  11/18/15 FL-2 FORM  AMB HH/NH/Hospice  10/16/15 DISCHARE [facility]  AMB Correspondence  12/22/15 LETTER AIM HEARING AND AUDIOLOGY  AMB Correspondence  12/02/15 HEARING ASSESSMENT AIM HEARING AND AUDIOLOGY  AMB Correspondence  12/31/15   Insurance Card Received 02/19/16 UHC (GEHA) 2017  Release of Information Received 05/25/16 DPR [facility] 05/11/16  AMB Provider Completed Forms  10/27/16 FL2 FORM  AMB Correspondence  11/09/16 VISIT SUMMARY [facility]  AMB Correspondence  11/11/16 VISIT SUMMARY [place] E-Signature HIPAA Notice of Privacy [place]     Advanced Beneficiary Notice (ABN) Not Received    E-Signature AOB [place] Not Received    [place] HIPAA NOTICE OF PRIVACY - Scanned Received (Deleted) 10/27/10   [place] E-Signature HIPAA Notice of Privacy [place] Not Received (Deleted)    Driver's License Not Received (Deleted)    Advance Directives/Living Will/HCPOA/POA Not Received (Deleted)    Insurance Card Received (Deleted) 28/78/67   Financial Application Not Received (Deleted)    Insurance Card Received (Deleted) 67/20/94   Driver's License Not Received (Deleted)    Documents for the Encounter  AOB (Assignment of Insurance Benefits) Received 04/03/17  patient unable to sign  E-signature AOB     MEDICARE RIGHTS Received 04/03/17 patient unable to sign  E-signature Medicare Rights     Cardiac  Monitoring Strip Shift Summary  04/03/17   EKG  04/04/17   Admission Information   Attending Provider Admitting Provider Admission Type Admission Date/Time  [name], [name] Halsted, MD [name] Lever, MD Emergency 04/03/17 1436  Discharge Date Hospital Service Auth/Cert Status Service Area   Internal Medicine Incomplete [place]  Unit Room/Bed Admission Status   AP-DEPT 300 A303/A303-01 Admission (Confirmed)   Admission   Complaint  ---  Hospital Account   Name Acct ID Class Status Primary Coverage  [name] [id number] Observation Open MEDICARE - MEDICARE PART A AND B      Guarantor Account (for Hospital Account [id number])   Name Relation to Pt Service Area Active? Acct Type  [name] Self [facility] Yes Personal/Family  Address Phone    [address] [place], [place] [address] [phone])        Coverage Information (for Hospital Account [id number])   1. [place] PART A AND B   F/O Payor/Plan Precert #  MEDICARE/MEDICARE PART A AND B   Subscriber Subscriber #  [name] [id number]  Address Phone  [address] [place] [place], [place] [address]   2. UNITED HEALTHCARE/GEHA   F/O Payor/Plan Precert #  [name] HEALTHCARE/GEHA   Subscriber Subscriber #  [name] [id number]  [place] [address] [phone]

## 2017-04-05 NOTE — Discharge Summary (Signed)
Physician Discharge Summary  [name] FFM:[id number] DOB: August 26, 1922 DOA: 04/03/2017  PCP: Eulas Post, MD  Admit date: 04/03/2017 Discharge date: 04/05/2017  Time spent: 45 minutes  Recommendations for Outpatient Follow-up:  -To be Discharged to residential hospice today for end-of-life care. -May use hospice care protocol.   Discharge Diagnoses:  Active Problems:   Hypoglycemia   Encephalopathy   Palliative care encounter   Goals of care, counseling/discussion   Encounter for end of life care   Discharge Condition: [name] Weights   04/03/17 1439 04/03/17 2330  Weight: 47.2 kg (104 lb) 39.5 kg (87 lb 1.6 oz)    History of present illness:  As per Dr. Ernestina Patches on 8/20: [name] is a 83 y.o. female with medical history significant of CAD, BPPV, DM presenting w/ encephalopathy and hypoglycemia. Per report, pt w/ encephalopathy from SNF. Lethargic. Also w/  Blood sugars in 40s. Unclear etiology. Level V caveat in setting of encephalopathy. Patient minimally verbal at baseline. Was also placed on oxygen in the ER setting of transient hypoxia. No reported fevers, vomiting, abdominal pain, diarrhea. Patient noted to have been admitted back in July for similar symptoms including hypoglycemia and acute encephalopathy. Patient given glucagon in route to the ER. ED Course: Presented to ER T 97.2, BP stable. Satting well on RA. Glu 161. Trop 0.05. Head CT stable. CXR w/ trace pleural effusion and cardiomegaly  Hospital Course:   Metabolic encephalopathy with prolonged hypoglycemia and subsequent coma Troponin leak in the setting of hypoglycemia History of hypertension History of type 2 diabetes History of coronary artery disease  Patient has been transitioned over to hospice and comfort care. She will be discharging to residential hospice facility today. Suspect most of her clinical course is due to sequela of profound hypoglycemia with anoxic brain  injury.  Procedures:  None   Consultations:  Palliative care  Discharge Instructions  Discharge Instructions    Diet - low sodium heart healthy    Complete by:  As directed    Increase activity slowly    Complete by:  As directed      Allergies as of 04/05/2017      Reactions   Penicillins Hives, Other (See Comments)   Has patient had a PCN reaction causing immediate rash, facial/tongue/throat swelling, SOB or lightheadedness with hypotension: Yes Has patient had a PCN reaction causing severe rash involving mucus membranes or skin necrosis: Unk Has patient had a PCN reaction that required hospitalization: Unk Has patient had a PCN reaction occurring within the last 10 years: Unk If all of the above answers are "NO", then may proceed with Cephalosporin use.   Sulfadiazine Hives      Medication List    STOP taking these medications   atorvastatin 20 MG tablet Commonly known as:  LIPITOR   azelastine 0.1 % nasal spray Commonly known as:  ASTELIN   bisacodyl 10 MG suppository Commonly known as:  DULCOLAX   blood glucose meter kit and supplies   busPIRone 10 MG tablet Commonly known as:  BUSPAR   feeding supplement (ENSURE ENLIVE) Liqd   feeding supplement (PRO-STAT SUGAR FREE 64) Liqd   ferrous sulfate 325 (65 FE) MG tablet   HYDROcodone-acetaminophen 5-325 MG tablet Commonly known as:  NORCO/VICODIN   insulin aspart 100 UNIT/ML injection Commonly known as:  novoLOG   Insulin Pen Needle 30G X 8 MM Misc Commonly known as:  NOVOFINE   LORazepam 0.5 MG tablet Commonly known  as:  ATIVAN   losartan 50 MG tablet Commonly known as:  COZAAR   metoprolol succinate 25 MG 24 hr tablet Commonly known as:  TOPROL-XL   multivitamin with minerals Tabs tablet   nitroGLYCERIN 0.4 MG SL tablet Commonly known as:  NITROSTAT   senna-docusate 8.6-50 MG tablet Commonly known as:  Senokot-S   TRAVATAN Z 0.004 % Soln ophthalmic solution Generic drug:  Travoprost  (BAK Free)   vitamin C 500 MG tablet Commonly known as:  ASCORBIC ACID   Vitamin D 2000 units Caps     TAKE these medications   acetaminophen 500 MG tablet Commonly known as:  TYLENOL Take 500 mg by mouth every 4 (four) hours as needed (for headache, doscomfort, or fever of 99.5-101 F).   atropine 1 % ophthalmic solution Place 1 drop under the tongue 3 (three) times daily as needed (increased oral secretions).   polyvinyl alcohol 1.4 % ophthalmic solution Commonly known as:  LIQUIFILM TEARS Place 2 drops into both eyes as needed for dry eyes.            Discharge Care Instructions        Start     Ordered   04/05/17 0000  atropine 1 % ophthalmic solution  3 times daily PRN     04/05/17 1220   04/05/17 0000  polyvinyl alcohol (LIQUIFILM TEARS) 1.4 % ophthalmic solution  As needed     04/05/17 1220   04/05/17 0000  Increase activity slowly     04/05/17 1220   04/05/17 0000  Diet - low sodium heart healthy     04/05/17 1220     Allergies  Allergen Reactions  . Penicillins Hives and Other (See Comments)    Has patient had a PCN reaction causing immediate rash, facial/tongue/throat swelling, SOB or lightheadedness with hypotension: Yes Has patient had a PCN reaction causing severe rash involving mucus membranes or skin necrosis: Unk Has patient had a PCN reaction that required hospitalization: Unk Has patient had a PCN reaction occurring within the last 10 years: Unk If all of the above answers are "NO", then may proceed with Cephalosporin use.  . Sulfadiazine Hives      The results of significant diagnostics from this hospitalization (including imaging, microbiology, ancillary and laboratory) are listed below for reference.    Significant Diagnostic Studies: Ct Head Wo Contrast  Result Date: 04/03/2017 CLINICAL DATA:  Altered mental status. EXAM: CT HEAD WITHOUT CONTRAST TECHNIQUE: Contiguous axial images were obtained from the base of the skull through the  vertex without intravenous contrast. COMPARISON:  02/10/2017 FINDINGS: Brain: There is no evidence for acute hemorrhage, hydrocephalus, mass lesion, or abnormal extra-axial fluid collection. No definite CT evidence for acute infarction. Diffuse loss of parenchymal volume is consistent with atrophy. Patchy low attenuation in the deep hemispheric and periventricular white matter is nonspecific, but likely reflects chronic microvascular ischemic demyelination. 2.2 cm calcified extra-axial right frontal lesion is similar to prior and remains compatible with meningioma. Vascular: Choose 1 Skull: No evidence for fracture. No worrisome lytic or sclerotic lesion. Sinuses/Orbits: The visualized paranasal sinuses and mastoid air cells are clear. Visualized portions of the globes and intraorbital fat are unremarkable. Other: None. IMPRESSION: 1. Stable.  No acute intracranial abnormality. 2. Atrophy with chronic small vessel white matter ischemic disease. 3. Similar appearance of the right frontal meningioma without adjacent edema. Electronically Signed   By: Misty Stanley M.D.   On: 04/03/2017 18:24   Dg Chest [facility]  Result Date: 04/03/2017 CLINICAL DATA:  Hypoxia EXAM: PORTABLE CHEST 1 VIEW COMPARISON:  02/10/2017 FINDINGS: Blunting of the costophrenic sulci and minimal base haziness. No Kerley lines, air bronchogram, or pneumothorax. Chronic cardiomegaly and aortic tortuosity, accentuated by rotation. Skin folds and EKG leads create artifact over the chest. IMPRESSION: Trace pleural fluid and mild atelectasis. Cardiomegaly without edema. Electronically Signed   By: Monte Fantasia M.D.   On: 04/03/2017 15:16    Microbiology: Recent Results (from the past 240 hour(s))  Culture, blood (Routine X 2) w Reflex to ID Panel     Status: None (Preliminary result)   Collection Time: 04/03/17  4:03 PM  Result Value Ref Range Status   Specimen Description BLOOD RIGHT HAND  Final   Special Requests   Final    BOTTLES  DRAWN AEROBIC ONLY Blood Culture results may not be optimal due to an inadequate volume of blood received in culture bottles   Culture NO GROWTH 2 DAYS  Final   Report Status PENDING  Incomplete  Culture, blood (Routine X 2) w Reflex to ID Panel     Status: None (Preliminary result)   Collection Time: 04/03/17  4:24 PM  Result Value Ref Range Status   Specimen Description BLOOD LEFT WRIST  Final   Special Requests   Final    BOTTLES DRAWN AEROBIC ONLY Blood Culture adequate volume   Culture NO GROWTH 2 DAYS  Final   Report Status PENDING  Incomplete  Urine culture     Status: None   Collection Time: 04/03/17  6:48 PM  Result Value Ref Range Status   Specimen Description URINE, CATHETERIZED  Final   Special Requests NONE  Final   Culture   Final    NO GROWTH Performed at [facility], [place] [address] [place], [place] [address]    Report Status 04/05/2017 FINAL  Final  MRSA PCR Screening     Status: None   Collection Time: 04/04/17  1:15 AM  Result Value Ref Range Status   MRSA by PCR NEGATIVE NEGATIVE Final    Comment:        The GeneXpert MRSA Assay (FDA approved for NASAL specimens only), is one component of a comprehensive MRSA colonization surveillance program. It is not intended to diagnose MRSA infection nor to guide or monitor treatment for MRSA infections.      Labs: Basic Metabolic Panel:  Recent Labs Lab 04/03/17 1529 04/04/17 0456  NA 139 144  K 4.0 4.8  CL 106 111  CO2 24 17*  GLUCOSE 161* 204*  BUN 19 22*  CREATININE 0.81 0.82  CALCIUM 9.8 9.7   Liver Function Tests:  Recent Labs Lab 04/03/17 1529 04/04/17 0456  AST 24 31  ALT 10* 11*  ALKPHOS 90 94  BILITOT 0.5 0.9  PROT 6.4* 6.5  ALBUMIN 2.2* 2.2*   No results for input(s): LIPASE, AMYLASE in the last 168 hours. No results for input(s): AMMONIA in the last 168 hours. CBC:  Recent Labs Lab 04/03/17 1529 04/04/17 0456  WBC 8.4 12.8*  NEUTROABS 6.7  --   HGB 11.0* 10.8*   HCT 34.8* 33.9*  MCV 88.1 87.4  PLT 261 271   Cardiac Enzymes:  Recent Labs Lab 04/03/17 1529 04/03/17 2056 04/04/17 0150 04/04/17 0828  TROPONINI 0.05* 0.10* 0.09* 0.08*   BNP: BNP (last 3 results)  Recent Labs  04/03/17 1624  BNP 163.0*    ProBNP (last 3 results) No results for input(s): PROBNP in the  last 8760 hours.  CBG:  Recent Labs Lab 04/03/17 1654 04/03/17 2027 04/04/17 0306 04/04/17 0750 04/04/17 1205  GLUCAP 175* 174* 236* 276* 267*       Signed:  HERNANDEZ ACOSTA,Shon Indelicato  [facility] Pager: [phone] 04/05/2017, 1:16 PM

## 2017-04-05 NOTE — Care Management (Signed)
Allergies  Penicillins  Hives, Other (See Comments) Medium Allergy 11/18/2008  Past Updates...  Has patient had a PCN reaction causing immediate rash, facial/tongue/throat swelling, SOB or lightheadedness with hypotension: Yes Has patient had a PCN reaction causing severe rash involving mucus membranes or skin necrosis: Unk Has patient had a PCN reaction that required hospitalization: Unk Has patient had a PCN reaction occurring within the last 10 years: Unk If all of the above answers are "NO", then may proceed with Cephalosporin use.    Sulfadiazine  Hives Medium Allergy 11/18/2008

## 2017-04-05 NOTE — Progress Notes (Signed)
Daily Progress Note   Patient Name: [name]       Date: 04/05/2017 DOB: Feb 06, 1923  Age: 83 y.o. MRN#: [id number] Attending Physician: Isaac Bliss, [place] Primary Care Physician: Eulas Post, MD Admit Date: 04/03/2017  Reason for Consultation/Follow-up: Establishing goals of care, Inpatient hospice referral and Psychosocial/spiritual support  Subjective: [name] is lying quietly in bed. She looks comfortable. Present today at bedside is daughter [name]. [name] has some shallow breathing, but her respiratory rate and drive increases as I touch her knee and her daughter and I talk. We talk about comfort measures, and [name]'s transfer to hospice home of [facility] today. No questions or concerns noted.  Conference with nursing related to comfort measures. Conference with case management and Education officer, museum.  Call to son, [name]. We discuss comfort and transfer to [facility]. [name] states that he will be contacting [facility] this morning to make sure they realize [name] will not return. I share that, at this point, there's nothing further for him to do as far as the hospital is concerned. [name] goes further to talk about his dysfunctional relationship with his sister, [name]. He states that they were both adopted, and that [name] has a difficult time maintaining her own home life/hygiene. I share were told that we will continue to care for his mother, and if he has questions or concerns to please call.  SYMPTOM MANAGEMENT per Hospice protocol.   Length of Stay: 0  Current Medications: Scheduled Meds:    Continuous Infusions:   PRN Meds: atropine, polyvinyl alcohol  Physical Exam  Constitutional: No distress.  Nonresponsive,  but resting quietly  HENT:  Head: Atraumatic.  Cardiovascular: Normal rate.   low 100's at times   Pulmonary/Chest: Effort normal. No respiratory distress.  Shallow breathing at times, no accessory muscle use  Abdominal: Soft. She exhibits no distension.  Musculoskeletal: She exhibits no edema.  Muscle wasting, contractures  knees  Neurological:  Nonresponsive, but looks comfortable  Skin: Skin is warm and dry.  Nursing note and vitals reviewed.           Vital Signs: BP (!) 115/53 (BP Location: Right Arm)   Pulse (!) 106   Temp 98.9 F (37.2 C) (Axillary)   Resp 16   Ht 5'  5" (1.651 m)   Wt 39.5 kg (87 lb 1.6 oz)   SpO2 98%   BMI 14.49 kg/m  SpO2: SpO2: 98 % O2 Device: O2 Device: Nasal Cannula O2 Flow Rate: O2 Flow Rate (L/min): 1 L/min  Intake/output summary:  Intake/Output Summary (Last 24 hours) at 04/05/17 1020 Last data filed at 04/04/17 1300  Gross per 24 hour  Intake                0 ml  Output              350 ml  Net             -350 ml   LBM: Last BM Date: 04/04/17 Baseline Weight: Weight: 47.2 kg (104 lb) Most recent weight: Weight: 39.5 kg (87 lb 1.6 oz)       Palliative Assessment/Data:    Flowsheet Rows     Most Recent Value  Intake Tab  Referral Department  Hospitalist  Unit at Time of Referral  Med/Surg Unit  Palliative Care Primary Diagnosis  Other (Comment) [hypoglycemia]  Date Notified  04/04/17  Reason for referral  Clarify Goals of Care  Date of Admission  04/03/17  Date first seen by Palliative Care  04/04/17  # of days Palliative referral response time  0 Day(s)  # of days IP prior to Palliative referral  1  Clinical Assessment  Palliative Performance Scale Score  10%  Pain Max last 24 hours  Not able to report  Pain Min Last 24 hours  Not able to report  Dyspnea Max Last 24 Hours  Not able to report  Dyspnea Min Last 24 hours  Not able to report  Psychosocial & Spiritual Assessment  Palliative Care Outcomes  Patient/Family  meeting held?  Yes  Who was at the meeting?  [name] daughter via phone. Multiple voicemail messages left for son [name], come conversation with [name] girlfriend [name]  Palliative Care Outcomes  Clarified goals of care, Counseled regarding hospice, Provided psychosocial or spiritual support  Patient/Family wishes: Interventions discontinued/not started   Mechanical Ventilation      Patient Active Problem List   Diagnosis Date Noted  . Palliative care encounter   . Goals of care, counseling/discussion   . Encounter for end of life care   . Encephalopathy 04/03/2017  . Protein-calorie malnutrition, severe 02/14/2017  . Hypokalemia 02/10/2017  . Lethargy 02/10/2017  . Normocytic anemia 02/10/2017  . Hypoglycemia due to insulin 02/10/2017  . Hypoglycemia 02/10/2017  . Agitated 01/19/2017  . UTI (urinary tract infection) 11/30/2016  . Diabetes mellitus with complication ([place]) 73/71/0626  . Acute lower UTI 11/29/2016  . Acute confusion due to infection   . Acute cystitis without hematuria   . Recurrent falls 10/05/2015  . Acute encephalopathy 09/24/2015  . Fever, myalgia, and generalized weakness 09/24/2015  . Hyperlipidemia 06/02/2015  . PMR (polymyalgia rheumatica) ([facility]) 12/17/2014  . Vertigo 10/12/2014  . Essential hypertension   . Single kidney   . Poorly controlled type 2 diabetes mellitus ([place]) 02/26/2013  . CAD (coronary artery disease) 04/21/2011  . [place], [place] 03/26/2010  . CARPAL TUNNEL SYNDROME 08/04/2009  . UNSTEADY GAIT 01/22/2007  . TEMPORAL ARTERITIS 10/12/2006  . RHINITIS, ALLERGIC 10/12/2006  . OSTEOPOROSIS, UNSPECIFIED 10/12/2006    Palliative Care Assessment & Plan   Patient Profile: 83 y.o. female  with past medical history of hypertension, coronary artery disease, diabetes, resident of [facility] admitted on 9/48/5462 with metabolic encephalopathy with prolonged hypoglycemia and  subsequent coma, likely anoxic brain  injury.  Assessment: prolonged hypoglycemia and subsequent coma, likely anoxic brain injury: [name] has been a resident of [place] home for quite some time. She has poor functional status, poor mobility. She is unable at this point to safely take food or liquids. Family has elected comfort and dignity, let nature take its course.  Recommendations/Plan:  transfer to [facility] for comfort and dignity at end-of-life.  Goals of Care and Additional Recommendations:  Limitations on Scope of Treatment: Full Comfort Care  Code Status:    Code Status Orders        Start     Ordered   04/03/17 1847  Do not attempt resuscitation ([name])  Continuous    Question Answer Comment  In the event of cardiac or respiratory ARREST Do not call a "code blue"   In the event of cardiac or respiratory ARREST Do not perform Intubation, CPR, defibrillation or ACLS   In the event of cardiac or respiratory ARREST Use medication by any route, position, wound care, and other measures to relive pain and suffering. May use oxygen, suction and manual treatment of airway obstruction as needed for comfort.      04/03/17 1847    Code Status History    Date Active Date Inactive Code Status Order ID Comments User Context   02/10/2017 11:58 PM 02/14/2017 10:52 PM [name] [id number]  Vianne Bulls, MD ED   01/19/2017  9:55 PM 01/21/2017  3:33 PM Full Code [id number]  Vianne Bulls, MD ED   11/29/2016  4:06 PM 12/02/2016  7:51 PM Full Code [id number]  Waldemar Dickens, MD Inpatient   09/24/2015  4:18 PM 09/25/2015  3:03 PM Full Code [id number]  Rama, Venetia Maxon, MD Inpatient    Advance Directive Documentation     Most Recent Value  Type of Advance Directive  Living will  Pre-existing out of facility [name] order (yellow form or pink MOST form)  -  "MOST" Form in Place?  -       Prognosis:   Hours - Days  Discharge Planning:  Hospice home of [facility] for comfort and dignity at  end-of-life.  Care plan was discussed with nursing staff, case manager, social worker, and Dr. Jerilee Hoh.  Thank you for allowing the Palliative Medicine Team to assist in the care of this patient.   Time In: 0930  Time Out: 0955  Total Time 25 minutes  Prolonged Time Billed  no       Greater than 50%  of this time was spent counseling and coordinating care related to the above assessment and plan.  Drue Novel, NP  Please contact Palliative Medicine Team phone at [phone] for questions and concerns.

## 2017-04-06 DIAGNOSIS — E43 Unspecified severe protein-calorie malnutrition: Secondary | ICD-10-CM | POA: Diagnosis not present

## 2017-04-08 LAB — CULTURE, BLOOD (ROUTINE X 2)
CULTURE: NO GROWTH
Culture: NO GROWTH
SPECIAL REQUESTS: ADEQUATE

## 2017-04-15 DEATH — deceased

## 2018-11-15 IMAGING — DX DG CHEST 2V
2 series · 2 of 2 positions shown · non-contrast
Comparison: 10/31/2015

CLINICAL DATA: Confusion.

EXAM:
CHEST  2 VIEW

[x chest ap]
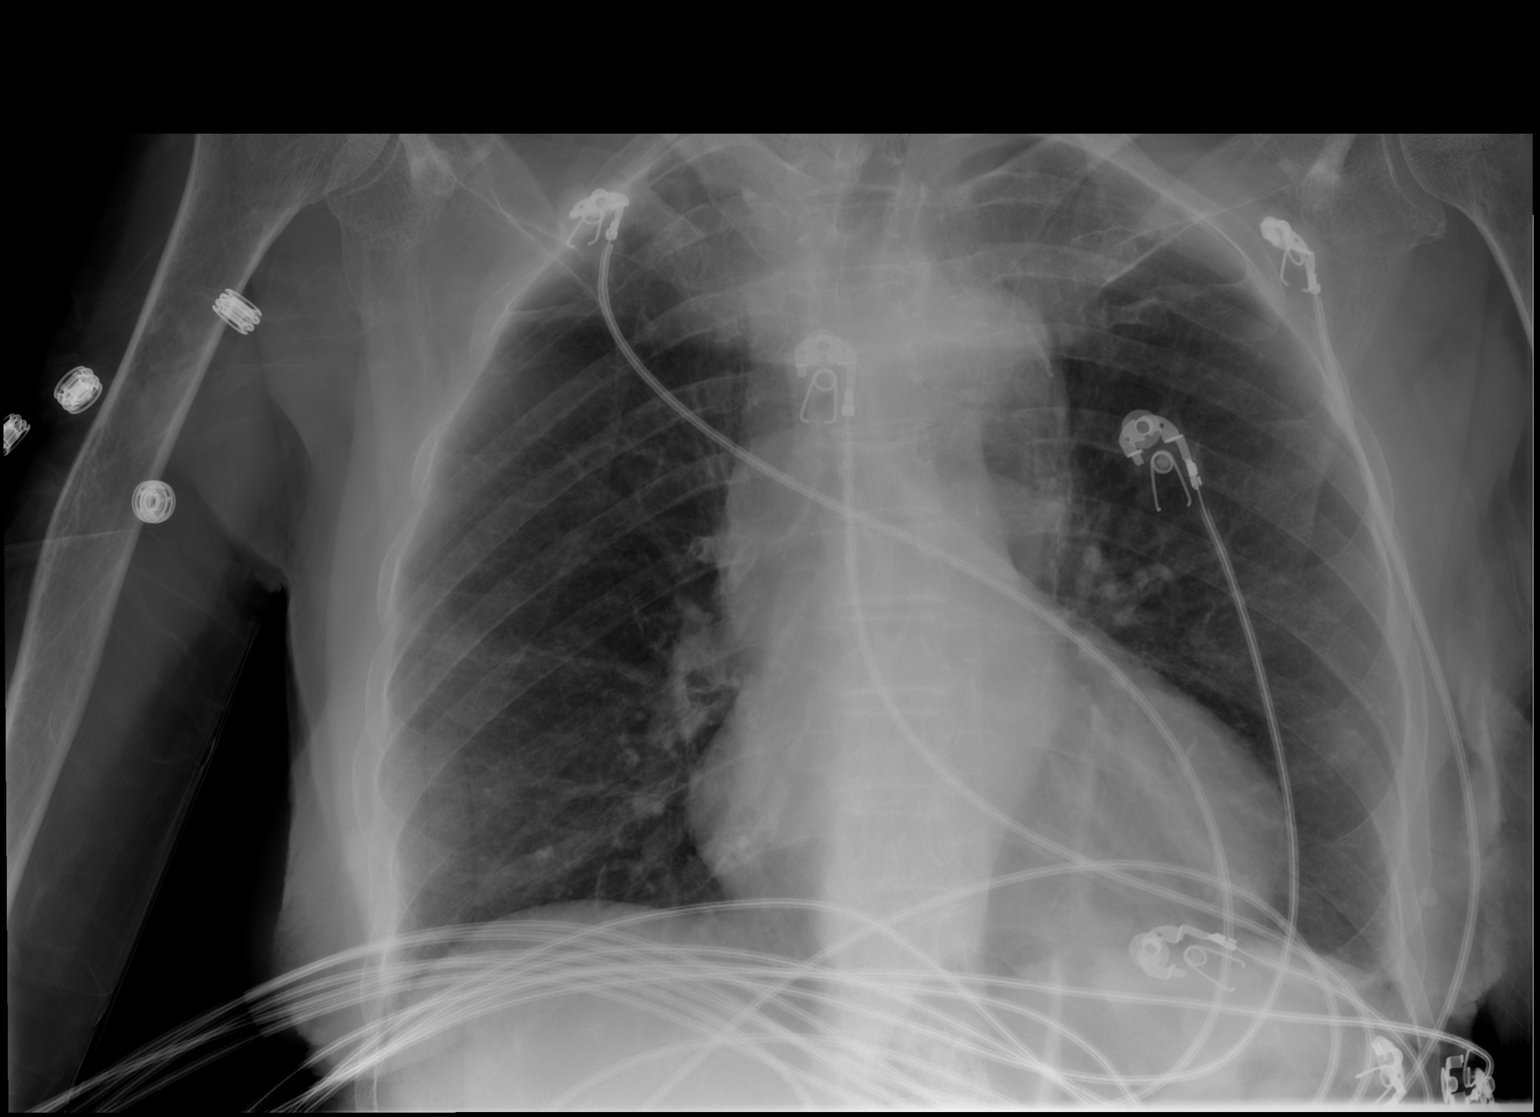

[w chest lat]
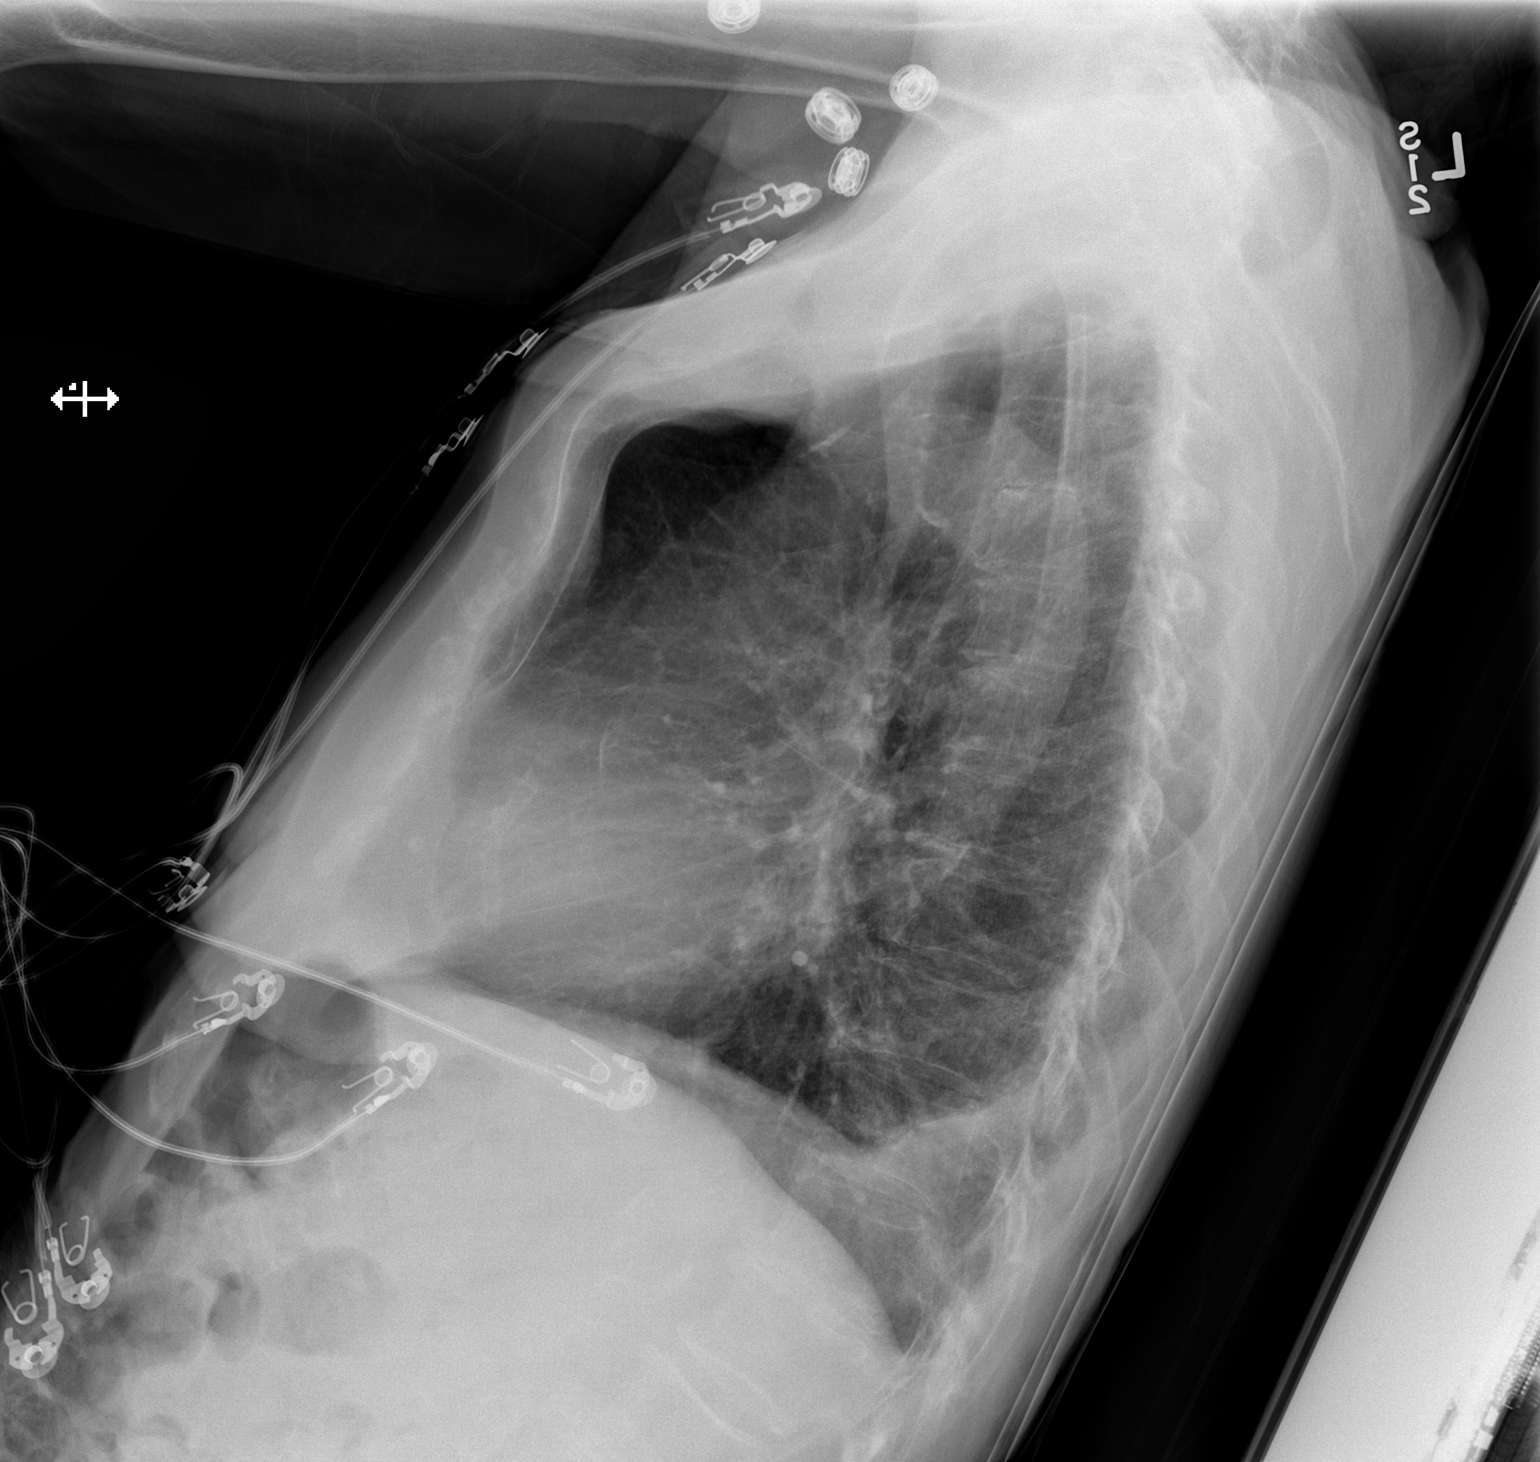

[2 of 2 positions shown; findings below may reference images not displayed]

FINDINGS: Cardiomediastinal silhouette is stably enlarged. Tortuosity and
calcific atherosclerotic disease of the aorta noted. There is a
stable deviation of the tracheal air column at the thoracic inlet to
the left.

There is no evidence of focal airspace consolidation, pleural
effusion or pneumothorax.

Osseous structures are without acute abnormality. Osteoarthritic
changes of bilateral glenohumeral joints. Soft tissues are grossly
normal.
IMPRESSION: Stable enlargement of the cardiac silhouette.

Tortuosity and calcific atherosclerotic disease of the aorta.

No evidence of active pulmonary process.

## 2018-11-15 IMAGING — CT CT CERVICAL SPINE W/O CM
4 of 8 series · 13 of 33 positions shown, 14 images · non-contrast
Comparison: Prior CT from 10/31/2015.

CLINICAL DATA: Initial evaluation for acute trauma, fall.

EXAM:
CT HEAD WITHOUT CONTRAST
CT CERVICAL SPINE WITHOUT CONTRAST
TECHNIQUE: Multidetector CT imaging of the head and cervical spine was
performed following the standard protocol without intravenous
contrast. Multiplanar CT image reconstructions of the cervical spine
were also generated.

[Series 5: coronal · coronal · 0.29mm/px · 2 of 65 slices shown]
[im 22/65  bone]
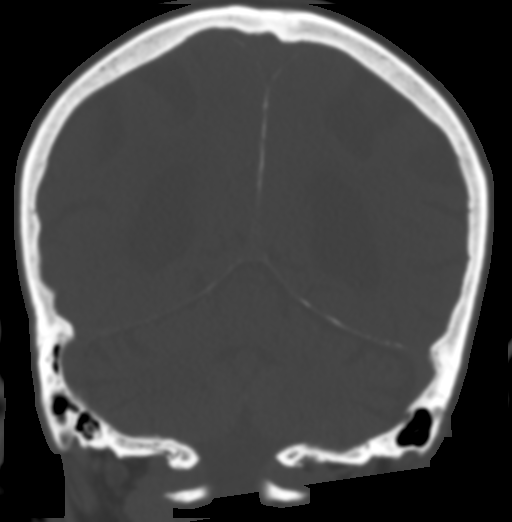
[im 43/65  bone]
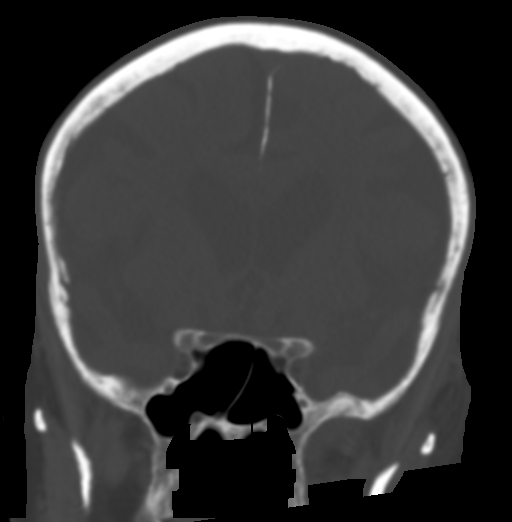

[Series 6: sagittal · sagittal · 0.30mm/px · 5 of 47 slices shown]
[im 8/47  bone]
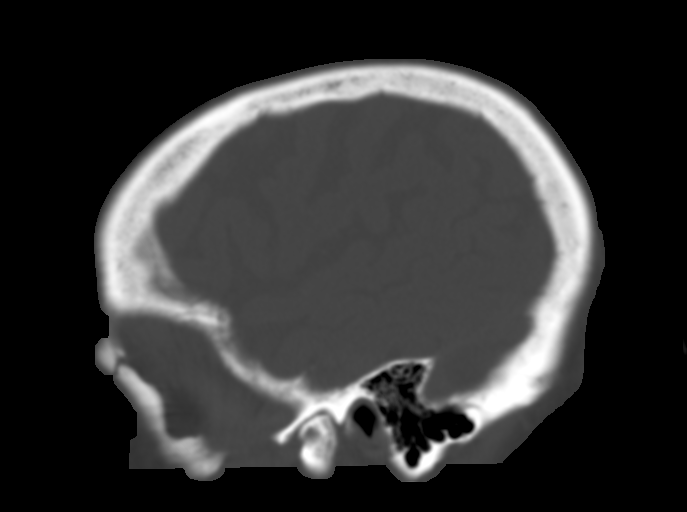
[im 16/47  bone]
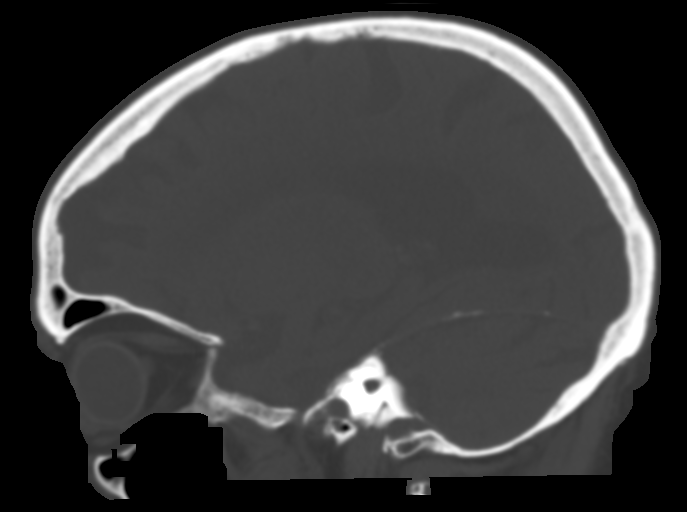
[im 24/47  bone]
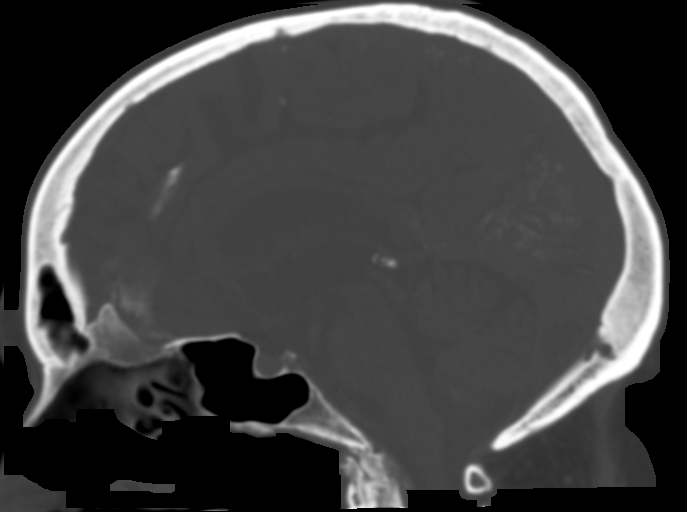
[im 31/47  bone]
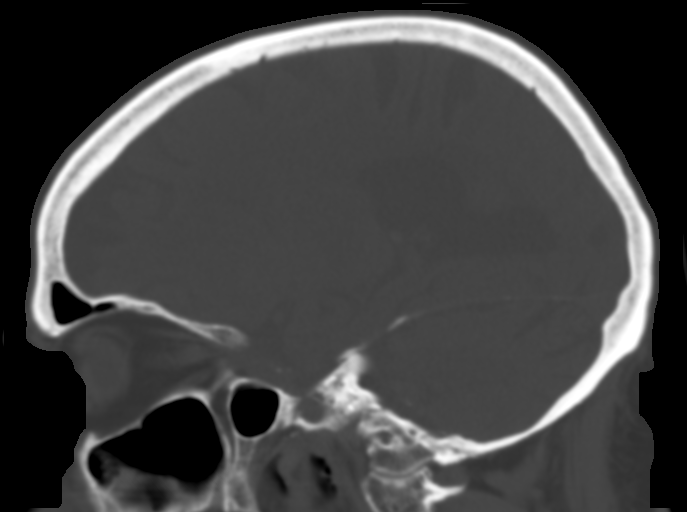
[im 39/47  bone]
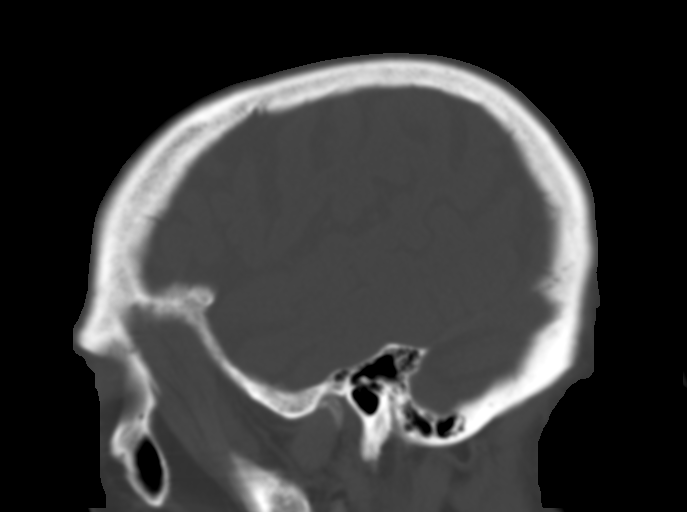

[Series 7: c-spine st · axial · 0.31mm/px · z∈[+1284,+1364]mm · 3 of 81 slices shown, 4 images]
[im 21/81  soft-tissue]
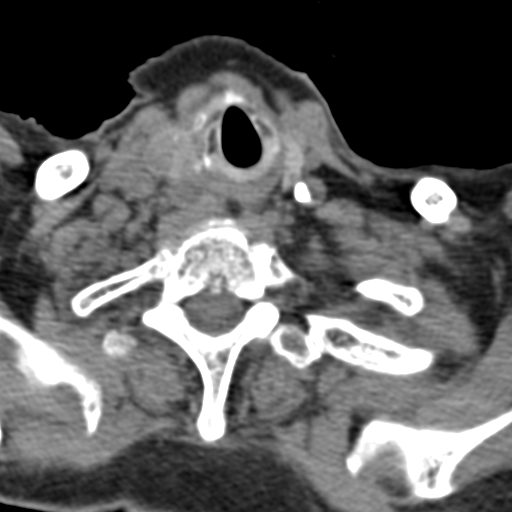
[im 21/81  bone]
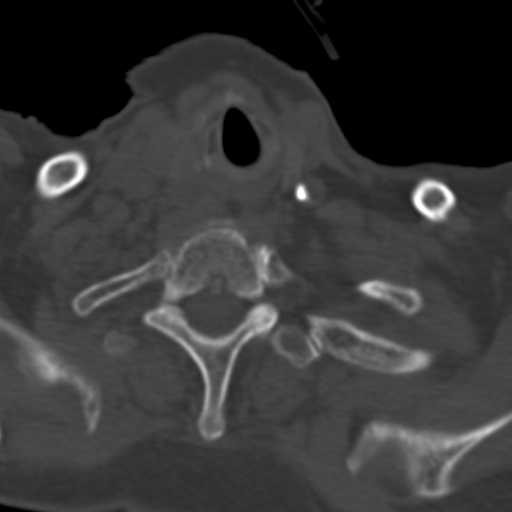
[im 41/81  bone]
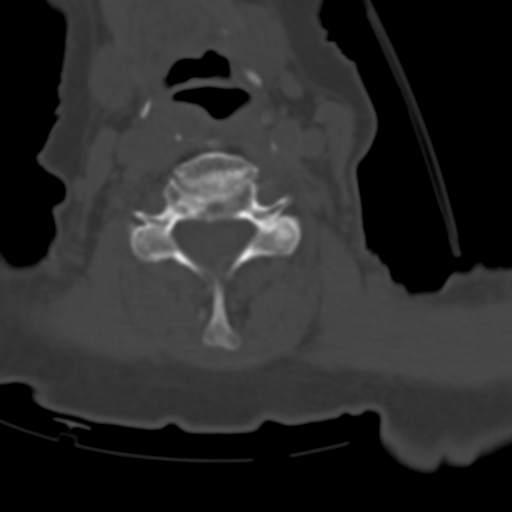
[im 61/81  bone]
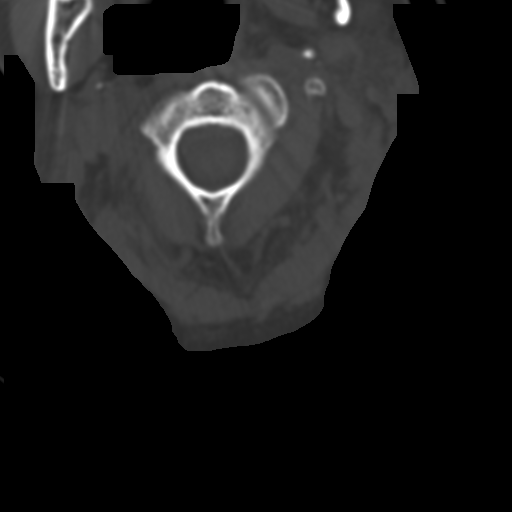

[Series 9: axial recon · axial · 0.21mm/px · z∈[+1271,+1347]mm · 3 of 83 slices shown]
[im 21/83  bone]
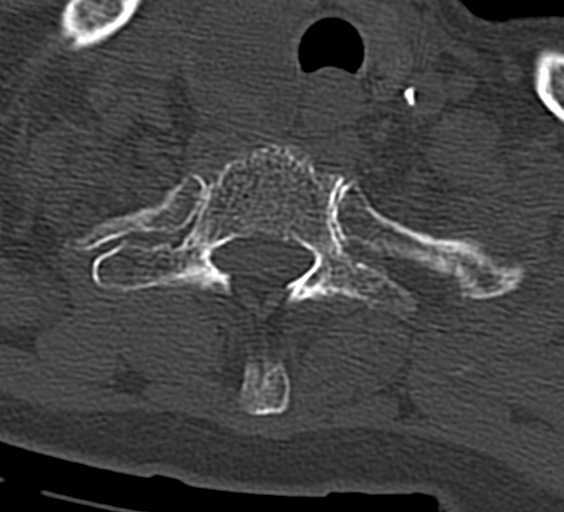
[im 42/83  bone]
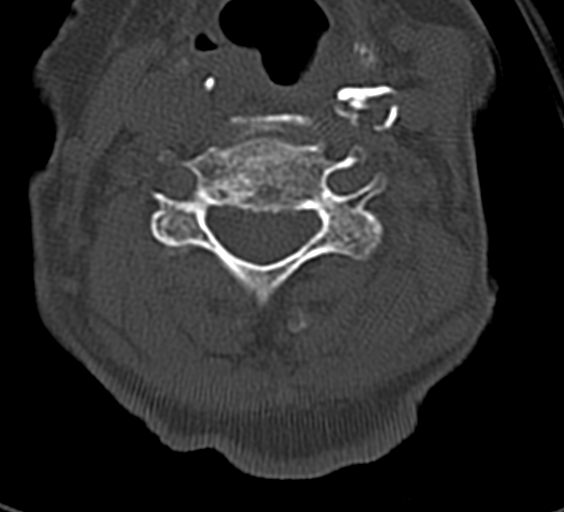
[im 62/83  bone]
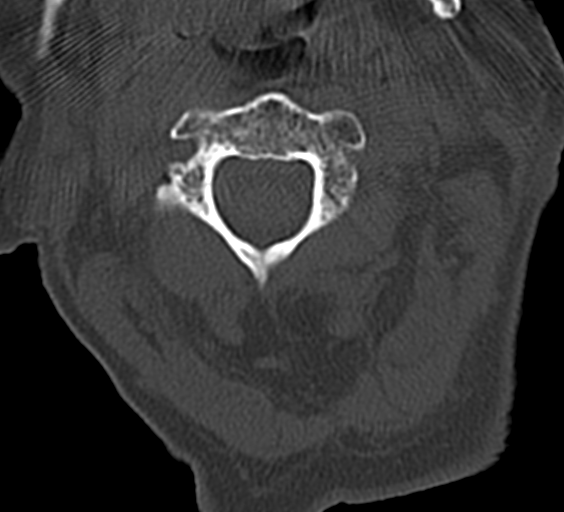

[13 of 33 positions shown; findings below may reference images not displayed]

FINDINGS: CT HEAD FINDINGS

Brain: Stable atrophy with chronic microvascular ischemic disease.
No acute intracranial hemorrhage. No evidence for acute large vessel
territory infarct. 2.2 cm calcified meningioma overlying the right
frontal convexity without significant edema. No other mass lesion.
No midline shift or mass effect. Ventricular prominence related to
global parenchymal volume loss of hydrocephalus. No extra-axial
fluid collection.

Vascular: No hyperdense vessel. Scattered vascular calcifications
noted within the carotid siphons.

Skull: Scalp soft tissues within normal limits.  Calvarium intact.

Sinuses/Orbits: Globes and orbital soft tissues within normal
limits. Mild scattered mucosal thickening within the ethmoidal air
cells and maxillary sinuses. Paranasal sinuses are otherwise clear.
No mastoid effusion.

CT CERVICAL SPINE FINDINGS

Alignment: Straightening of the normal cervical lordosis. Trace
anterolisthesis of C2 on C3, C3 on C4, C4 on C5, and C7 on T1,
likely due to chronic facet degeneration.

Skull base and vertebrae: Skullbase intact. Normal C1-2
articulations preserved. Dens is intact. Vertebral body heights
maintained. No acute fracture.

Soft tissues and spinal canal: Visualized soft tissues of the neck
demonstrate no acute abnormality. No prevertebral edema.
Heterogeneous multinodular thyroid noted with prominent 2.4 cm right
thyroid nodule, stable from previous.

Disc levels: A advanced degenerative spondylolysis at C3-4 through
C6-7. Multilevel facet arthrosis, stable.

Upper chest: Visualized upper chest demonstrates no acute
abnormality. No apical pneumothorax.
IMPRESSION: CT BRAIN:

1. No acute intracranial process.
2. Stable approximate 2 cm meningioma overlying the right frontal
convexity without associated edema.
3. Stable atrophy with chronic microvascular ischemic disease.

CT CERVICAL SPINE:

1. No acute traumatic injury within the cervical spine.
2. Advanced degenerative spondylolysis at C3-4 through C6-7 with
multilevel facet arthrosis.
3. Stable multinodular thyroid with dominant 2.4 cm right thyroid
nodule. Again, thyroid ultrasound could be performed for further
evaluation if clinically desired.

## 2019-01-05 IMAGING — DX DG CHEST 1V PORT
1 series · 1 of 1 positions shown · non-contrast
Comparison: 11/29/2016

CLINICAL DATA: Altered mental status

EXAM:
PORTABLE CHEST 1 VIEW

[chest ap]
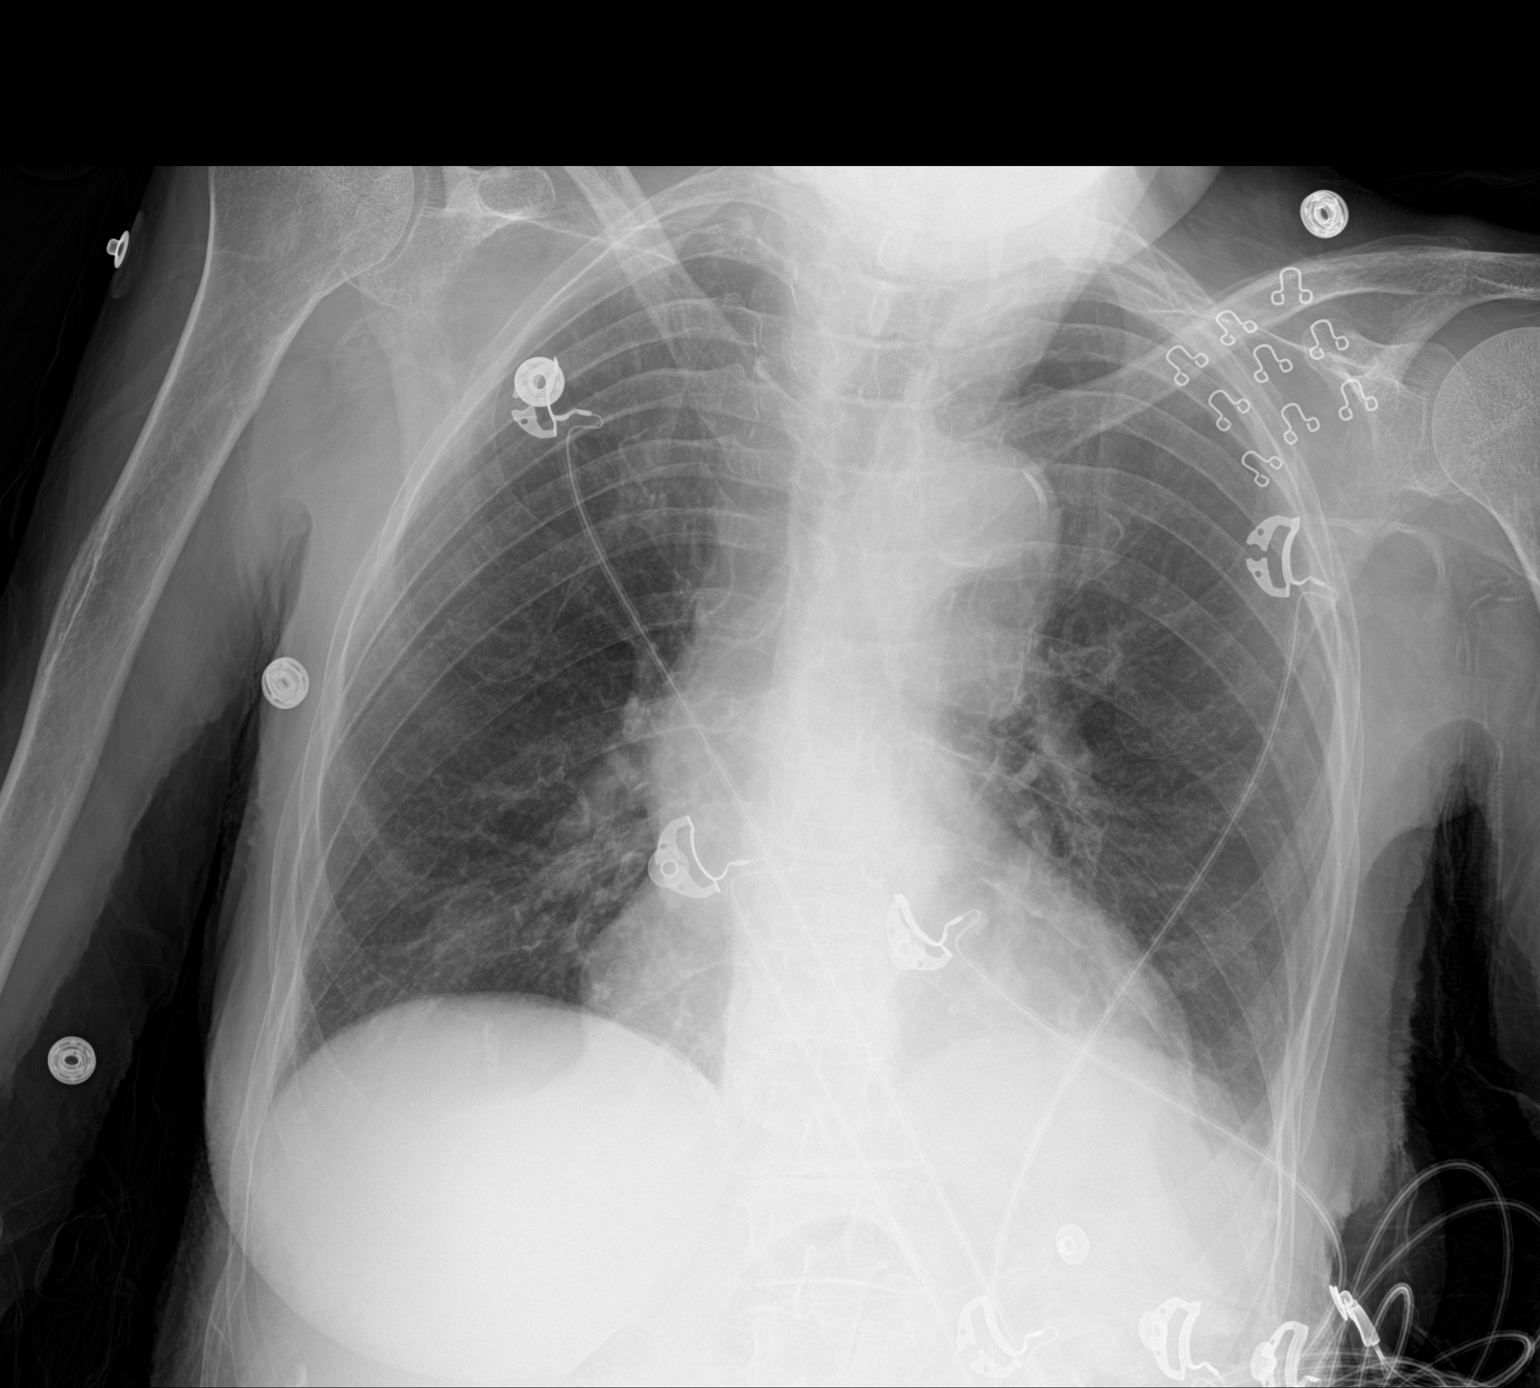

[1 of 1 positions shown; findings below may reference images not displayed]

FINDINGS: Mild atelectasis or infiltrate at the left base with suspected tiny
pleural effusion. Stable mild cardiomegaly. Aortic atherosclerosis.
No pneumothorax.
IMPRESSION: Suspect tiny left pleural effusion. Mild left basilar atelectasis or
infiltrate.

Stable cardiomegaly
